# Patient Record
Sex: Male | Born: 1960 | Race: Black or African American | Hispanic: No | Marital: Married | Smoking: Current every day smoker
Health system: Southern US, Community
[De-identification: ages and names within clinical notes are randomized; demographics above are authoritative.]

## PROBLEM LIST (undated history)

## (undated) DIAGNOSIS — E119 Type 2 diabetes mellitus without complications: Secondary | ICD-10-CM

## (undated) DIAGNOSIS — N183 Chronic kidney disease, stage 3 unspecified: Secondary | ICD-10-CM

## (undated) DIAGNOSIS — I480 Paroxysmal atrial fibrillation: Secondary | ICD-10-CM

## (undated) DIAGNOSIS — E7800 Pure hypercholesterolemia, unspecified: Secondary | ICD-10-CM

## (undated) DIAGNOSIS — C61 Malignant neoplasm of prostate: Secondary | ICD-10-CM

## (undated) DIAGNOSIS — I10 Essential (primary) hypertension: Secondary | ICD-10-CM

## (undated) DIAGNOSIS — Z8679 Personal history of other diseases of the circulatory system: Secondary | ICD-10-CM

## (undated) DIAGNOSIS — Z872 Personal history of diseases of the skin and subcutaneous tissue: Secondary | ICD-10-CM

## (undated) DIAGNOSIS — R42 Dizziness and giddiness: Secondary | ICD-10-CM

## (undated) HISTORY — PX: LUNG LOBECTOMY: SHX167

---

## 2004-07-29 DIAGNOSIS — H8110 Benign paroxysmal vertigo, unspecified ear: Secondary | ICD-10-CM | POA: Insufficient documentation

## 2011-02-07 ENCOUNTER — Ambulatory Visit: Payer: Self-pay | Admitting: Internal Medicine

## 2011-02-17 ENCOUNTER — Ambulatory Visit: Payer: Self-pay | Admitting: Internal Medicine

## 2011-04-08 HISTORY — PX: HIP ARTHROPLASTY: SHX981

## 2011-04-28 ENCOUNTER — Ambulatory Visit: Payer: Self-pay | Admitting: Unknown Physician Specialty

## 2011-04-28 LAB — BASIC METABOLIC PANEL
BUN: 27 mg/dL — ABNORMAL HIGH (ref 7–18)
Chloride: 101 mmol/L (ref 98–107)
Creatinine: 1.61 mg/dL — ABNORMAL HIGH (ref 0.60–1.30)
EGFR (African American): 59 — ABNORMAL LOW
Glucose: 114 mg/dL — ABNORMAL HIGH (ref 65–99)
Potassium: 4.3 mmol/L (ref 3.5–5.1)
Sodium: 137 mmol/L (ref 136–145)

## 2011-04-28 LAB — URINALYSIS, COMPLETE
Bilirubin,UR: NEGATIVE
Glucose,UR: NEGATIVE mg/dL (ref 0–75)
Hyaline Cast: 14
Ketone: NEGATIVE
Ph: 5 (ref 4.5–8.0)
Protein: 30
Squamous Epithelial: 2
WBC UR: 8 /HPF (ref 0–5)

## 2011-04-28 LAB — CBC
HCT: 47.1 % (ref 40.0–52.0)
MCH: 26.4 pg (ref 26.0–34.0)
MCHC: 33.1 g/dL (ref 32.0–36.0)
MCV: 80 fL (ref 80–100)
Platelet: 314 10*3/uL (ref 150–440)
RDW: 15 % — ABNORMAL HIGH (ref 11.5–14.5)
WBC: 12.2 10*3/uL — ABNORMAL HIGH (ref 3.8–10.6)

## 2011-04-28 LAB — PROTIME-INR
INR: 0.9
Prothrombin Time: 13 secs (ref 11.5–14.7)

## 2011-05-19 ENCOUNTER — Inpatient Hospital Stay: Payer: Self-pay | Admitting: Unknown Physician Specialty

## 2011-05-19 LAB — ELECTROLYTE PANEL
Chloride: 101 mmol/L (ref 98–107)
Co2: 28 mmol/L (ref 21–32)
Potassium: 4 mmol/L (ref 3.5–5.1)
Sodium: 138 mmol/L (ref 136–145)

## 2011-05-20 LAB — BASIC METABOLIC PANEL
Anion Gap: 9 (ref 7–16)
BUN: 21 mg/dL — ABNORMAL HIGH (ref 7–18)
Calcium, Total: 7.5 mg/dL — ABNORMAL LOW (ref 8.5–10.1)
Co2: 24 mmol/L (ref 21–32)
EGFR (Non-African Amer.): 35 — ABNORMAL LOW
Glucose: 132 mg/dL — ABNORMAL HIGH (ref 65–99)
Osmolality: 267 (ref 275–301)
Potassium: 4.6 mmol/L (ref 3.5–5.1)
Sodium: 131 mmol/L — ABNORMAL LOW (ref 136–145)

## 2011-05-21 LAB — BASIC METABOLIC PANEL
Calcium, Total: 7.9 mg/dL — ABNORMAL LOW (ref 8.5–10.1)
Co2: 23 mmol/L (ref 21–32)
EGFR (African American): 36 — ABNORMAL LOW
EGFR (Non-African Amer.): 30 — ABNORMAL LOW
Glucose: 101 mg/dL — ABNORMAL HIGH (ref 65–99)
Potassium: 4.6 mmol/L (ref 3.5–5.1)
Sodium: 130 mmol/L — ABNORMAL LOW (ref 136–145)

## 2011-05-21 LAB — OSMOLALITY, URINE: Osmolality: 452 mOsm/kg

## 2011-05-21 LAB — TSH: Thyroid Stimulating Horm: 2.17 u[IU]/mL

## 2011-05-21 LAB — HEMOGLOBIN: HGB: 8.4 g/dL — ABNORMAL LOW (ref 13.0–18.0)

## 2011-05-22 LAB — BASIC METABOLIC PANEL
Anion Gap: 8 (ref 7–16)
BUN: 17 mg/dL (ref 7–18)
Calcium, Total: 8.2 mg/dL — ABNORMAL LOW (ref 8.5–10.1)
Chloride: 99 mmol/L (ref 98–107)
Co2: 24 mmol/L (ref 21–32)
Creatinine: 1.35 mg/dL — ABNORMAL HIGH (ref 0.60–1.30)
EGFR (African American): >60
EGFR (Non-African Amer.): 59 — ABNORMAL LOW
Glucose: 116 mg/dL — ABNORMAL HIGH (ref 65–99)
Osmolality: 265 (ref 275–301)
Potassium: 4.5 mmol/L (ref 3.5–5.1)
Sodium: 131 mmol/L — ABNORMAL LOW (ref 136–145)

## 2011-05-22 LAB — CBC WITH DIFFERENTIAL/PLATELET
Basophil #: 0 10*3/uL (ref 0.0–0.1)
Basophil %: 0 %
Eosinophil #: 0 10*3/uL (ref 0.0–0.7)
HCT: 22.6 % — ABNORMAL LOW (ref 40.0–52.0)
HGB: 7.6 g/dL — ABNORMAL LOW (ref 13.0–18.0)
Lymphocyte #: 1.7 10*3/uL (ref 1.0–3.6)
Lymphocyte %: 10.5 %
MCHC: 33.5 g/dL (ref 32.0–36.0)
Monocyte %: 10.9 %
Neutrophil #: 12.8 10*3/uL — ABNORMAL HIGH (ref 1.4–6.5)
Platelet: 171 10*3/uL (ref 150–440)
RDW: 15.2 % — ABNORMAL HIGH (ref 11.5–14.5)
WBC: 16.4 10*3/uL — ABNORMAL HIGH (ref 3.8–10.6)

## 2011-05-23 LAB — BASIC METABOLIC PANEL
Anion Gap: 10 (ref 7–16)
BUN: 14 mg/dL (ref 7–18)
Calcium, Total: 8.2 mg/dL — ABNORMAL LOW (ref 8.5–10.1)
Chloride: 101 mmol/L (ref 98–107)
Creatinine: 1.22 mg/dL (ref 0.60–1.30)
Osmolality: 273 (ref 275–301)

## 2011-05-24 LAB — UR PROT ELECTROPHORESIS, URINE RANDOM

## 2011-06-09 ENCOUNTER — Ambulatory Visit: Payer: Self-pay | Admitting: Internal Medicine

## 2011-06-09 ENCOUNTER — Ambulatory Visit: Payer: Self-pay | Admitting: Oncology

## 2011-06-11 LAB — CBC CANCER CENTER
Eosinophil: 3 %
Lymphocytes: 22 %
Monocytes: 8 %
Platelet: 442 x10 3/mm — ABNORMAL HIGH (ref 150–440)
RBC: 3.86 10*6/uL — ABNORMAL LOW (ref 4.40–5.90)
RDW: 15.5 % — ABNORMAL HIGH (ref 11.5–14.5)
Segmented Neutrophils: 66 %
WBC: 11.7 x10 3/mm — ABNORMAL HIGH (ref 3.8–10.6)

## 2011-06-11 LAB — IRON AND TIBC
Iron Bind.Cap.(Total): 362 ug/dL (ref 250–450)
Iron Saturation: 18 %
Iron: 64 ug/dL — ABNORMAL LOW (ref 65–175)

## 2011-06-11 LAB — LACTATE DEHYDROGENASE: LDH: 144 U/L (ref 87–241)

## 2011-06-11 LAB — FOLATE: Folic Acid: 15.9 ng/mL (ref 3.1–100.0)

## 2011-06-11 LAB — RETICULOCYTES
Absolute Retic Count: 0.16 10*6/uL — ABNORMAL HIGH (ref 0.024–0.084)
Reticulocyte: 4.3 % — ABNORMAL HIGH (ref 0.5–1.5)

## 2011-06-25 LAB — CBC CANCER CENTER
Basophil #: 0.3 x10 3/mm — ABNORMAL HIGH (ref 0.0–0.1)
Eosinophil #: 0.5 x10 3/mm (ref 0.0–0.7)
HCT: 37.7 % — ABNORMAL LOW (ref 40.0–52.0)
HGB: 12.2 g/dL — ABNORMAL LOW (ref 13.0–18.0)
Lymphocyte %: 23.8 %
MCHC: 32.3 g/dL (ref 32.0–36.0)
Monocyte %: 8.3 %
Neutrophil %: 61 %
Platelet: 310 x10 3/mm (ref 150–440)
RBC: 4.49 10*6/uL (ref 4.40–5.90)
RDW: 16.9 % — ABNORMAL HIGH (ref 11.5–14.5)
WBC: 12.5 x10 3/mm — ABNORMAL HIGH (ref 3.8–10.6)

## 2011-07-07 ENCOUNTER — Ambulatory Visit: Payer: Self-pay | Admitting: Oncology

## 2011-07-07 ENCOUNTER — Emergency Department: Payer: Self-pay | Admitting: Emergency Medicine

## 2011-07-07 ENCOUNTER — Ambulatory Visit: Payer: Self-pay | Admitting: Internal Medicine

## 2011-07-07 LAB — BASIC METABOLIC PANEL
BUN: 17 mg/dL (ref 7–18)
Calcium, Total: 9 mg/dL (ref 8.5–10.1)
Co2: 24 mmol/L (ref 21–32)
Creatinine: 1.21 mg/dL (ref 0.60–1.30)
EGFR (African American): >60
EGFR (Non-African Amer.): >60
Osmolality: 273 (ref 275–301)
Sodium: 135 mmol/L — ABNORMAL LOW (ref 136–145)

## 2011-07-07 LAB — TROPONIN I: Troponin-I: <0.02 ng/mL

## 2011-07-07 LAB — CBC
HGB: 12.3 g/dL — ABNORMAL LOW (ref 13.0–18.0)
MCH: 27 pg (ref 26.0–34.0)
MCHC: 32.4 g/dL (ref 32.0–36.0)
MCV: 83 fL (ref 80–100)
RBC: 4.54 10*6/uL (ref 4.40–5.90)
RDW: 17.7 % — ABNORMAL HIGH (ref 11.5–14.5)
WBC: 11.3 10*3/uL — ABNORMAL HIGH (ref 3.8–10.6)

## 2012-02-18 IMAGING — US US RENAL KIDNEY
1 series · 17 of 25 positions shown · non-contrast
Comparison: none

REASON FOR EXAM: ckd
COMMENTS:

[Series 1: us renal kidney · 17 of 35 slices shown]
[im 1/35]
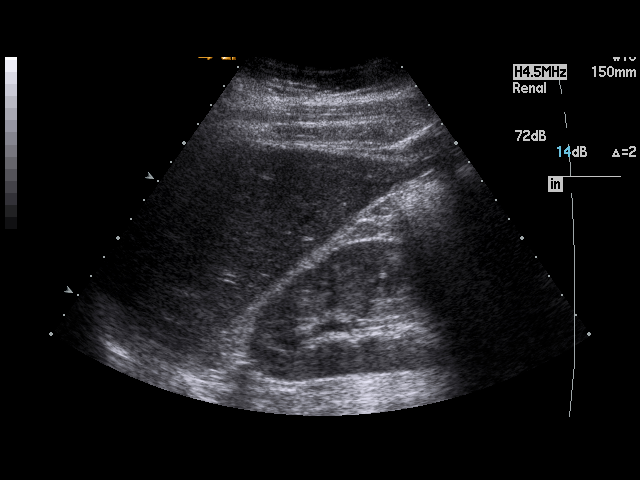
[im 3/35]
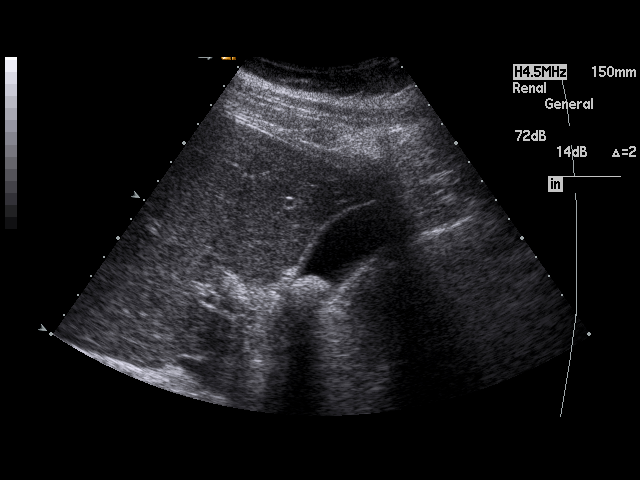
[im 5/35]
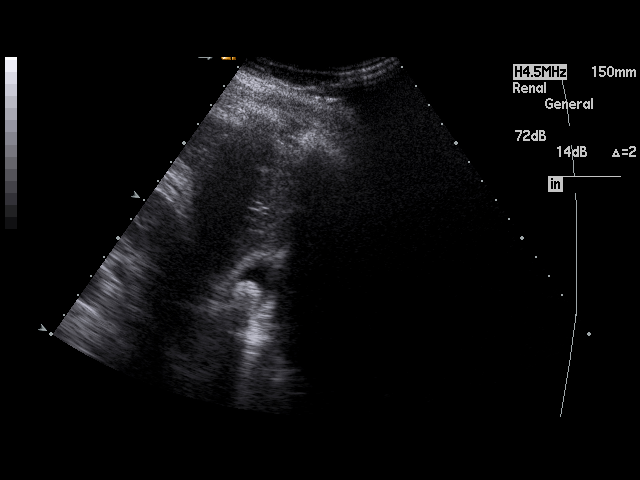
[im 8/35]
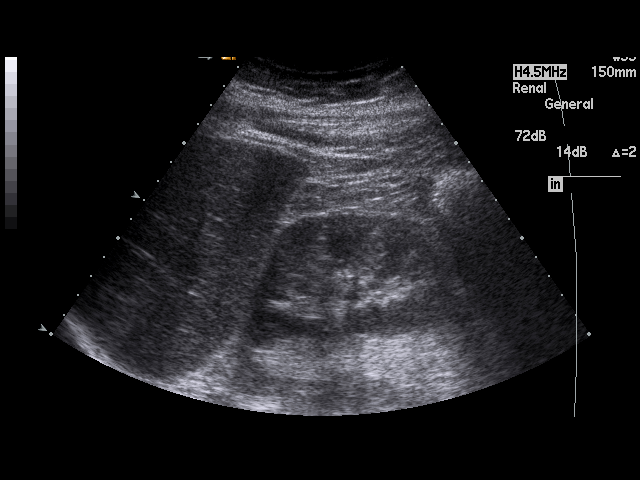
[im 9/35]
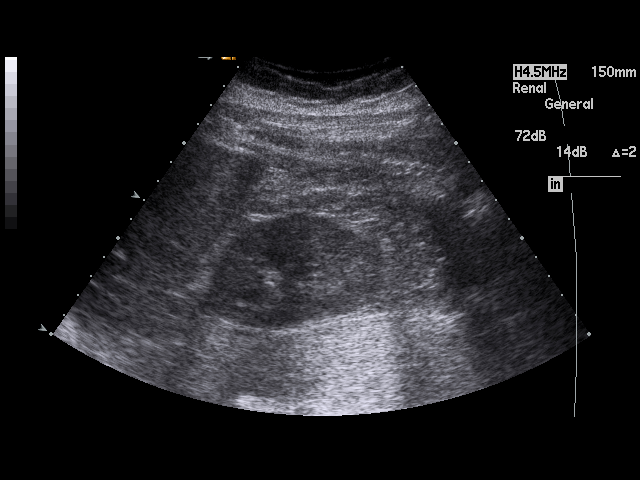
[im 12/35]
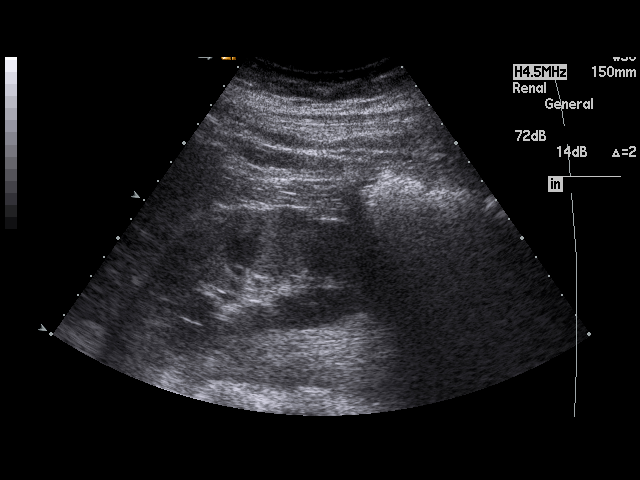
[im 13/35]
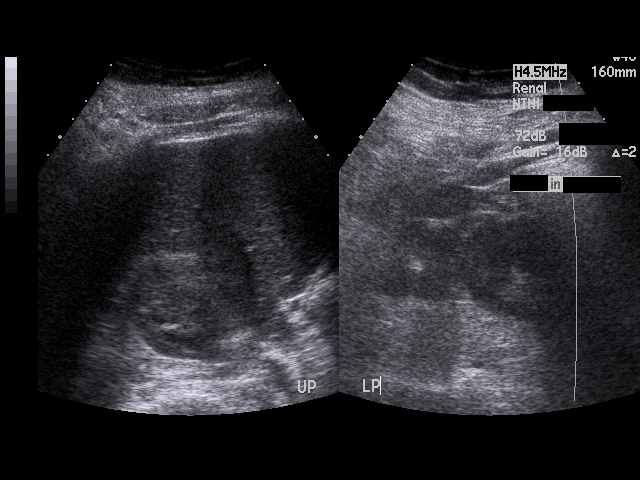
[im 16/35]
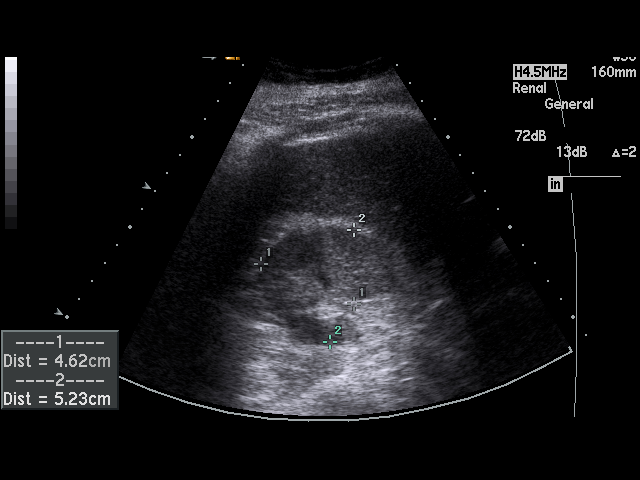
[im 18/35]
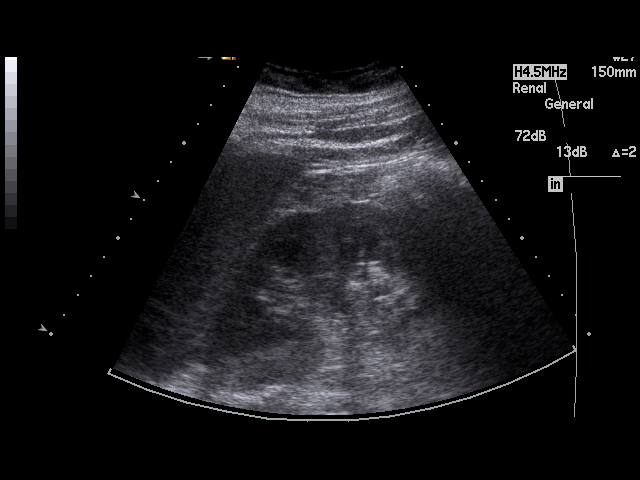
[im 19/35]
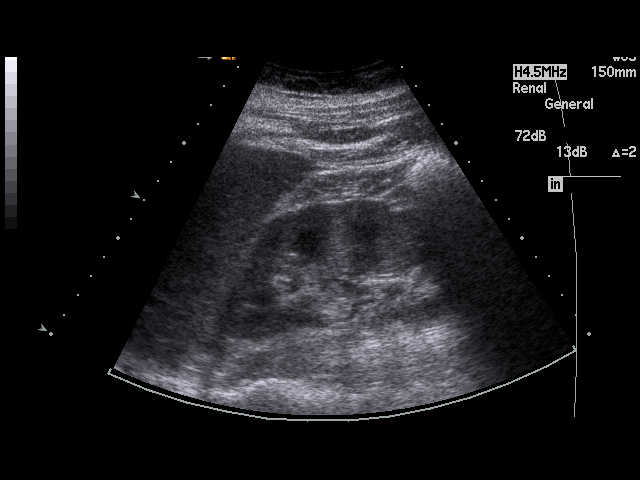
[im 22/35]
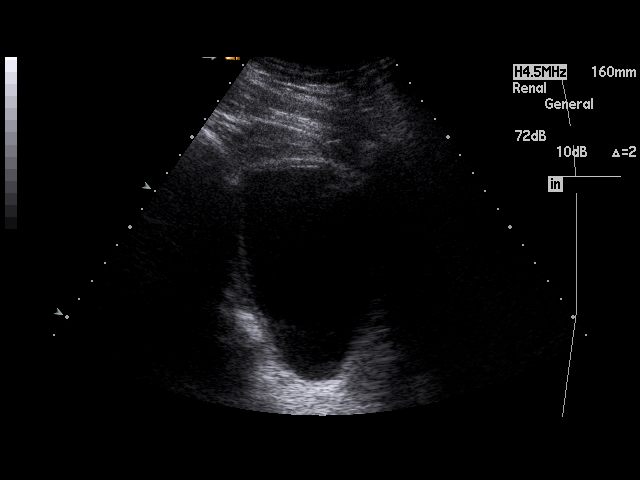
[im 23/35]
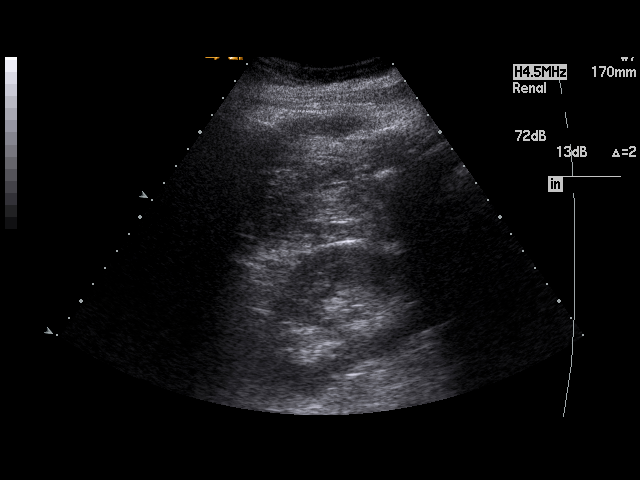
[im 26/35]
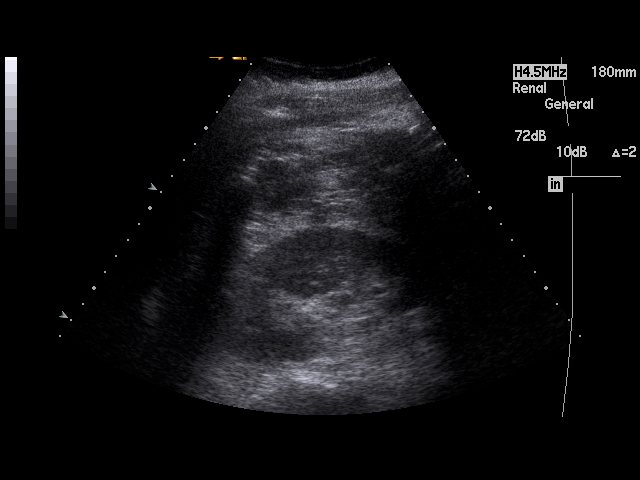
[im 27/35]
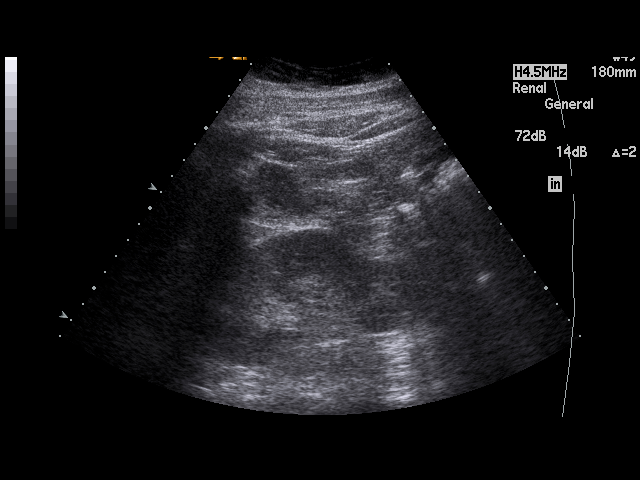
[im 30/35]
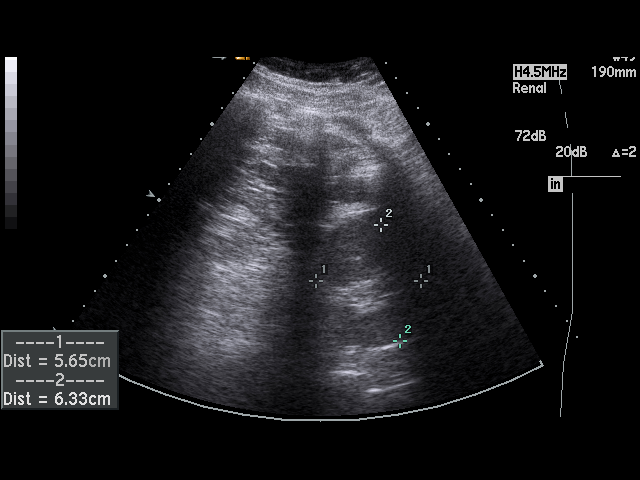
[im 32/35]
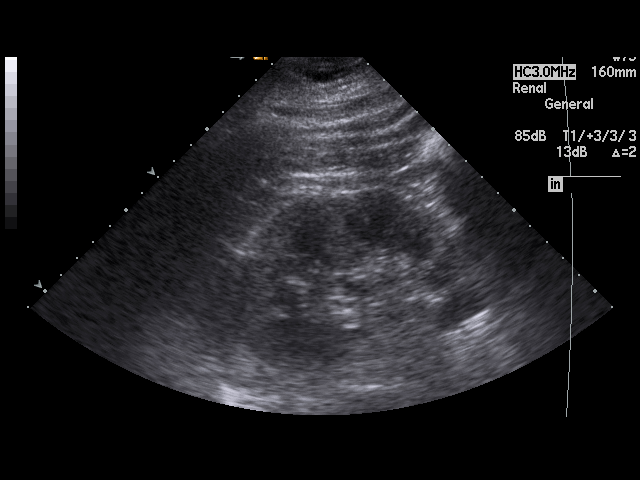
[im 35/35]
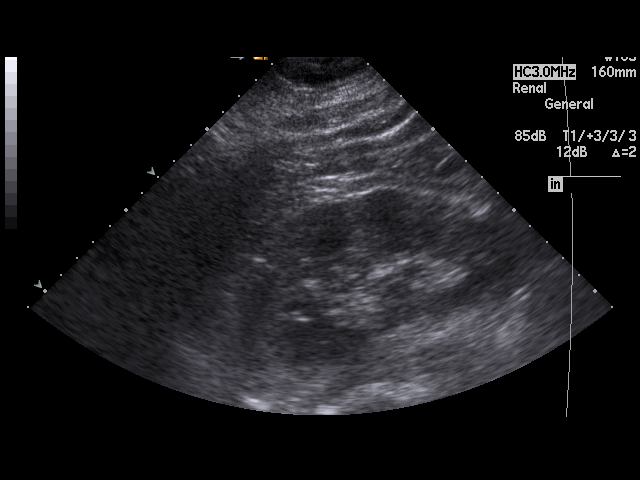

[17 of 25 positions shown; findings below may reference images not displayed]

PROCEDURE:     US  - US KIDNEY  - May 21, 2011  [DATE]

RESULT:     The right kidney measures 9.6 by 4.6 x 5.2 cm. The left kidney
measures 11.9 by 5.7 x 6.3 cm. There is no evidence of hydronephrosis. The
echotexture of the renal cortex on the right is only slightly lower than
that of the adjacent liver. The partially distended urinary bladder is
normal in appearance.

Incidental note is made of at least one gallstone measuring nearly 2 cm in
diameter.
IMPRESSION: 1. I do not see acute abnormality of either kidney. I cannot exclude an
element of medical renal disease given the very mild increase in the
echotexture of the renal cortex on the right as compared to the liver.
2. There is a gallstone present.

## 2012-05-09 ENCOUNTER — Inpatient Hospital Stay: Payer: Self-pay | Admitting: Surgery

## 2012-05-09 LAB — COMPREHENSIVE METABOLIC PANEL
Alkaline Phosphatase: 168 U/L — ABNORMAL HIGH (ref 50–136)
Anion Gap: 8 (ref 7–16)
BUN: 15 mg/dL (ref 7–18)
Bilirubin,Total: 0.6 mg/dL (ref 0.2–1.0)
Calcium, Total: 9.4 mg/dL (ref 8.5–10.1)
Co2: 24 mmol/L (ref 21–32)
Creatinine: 1.5 mg/dL — ABNORMAL HIGH (ref 0.60–1.30)
EGFR (Non-African Amer.): 53 — ABNORMAL LOW
Glucose: 110 mg/dL — ABNORMAL HIGH (ref 65–99)
Osmolality: 272 (ref 275–301)
Potassium: 4.1 mmol/L (ref 3.5–5.1)
SGOT(AST): 21 U/L (ref 15–37)
Sodium: 135 mmol/L — ABNORMAL LOW (ref 136–145)
Total Protein: 9.7 g/dL — ABNORMAL HIGH (ref 6.4–8.2)

## 2012-05-09 LAB — CBC
HCT: 43.7 % (ref 40.0–52.0)
HGB: 14 g/dL (ref 13.0–18.0)
MCH: 25 pg — ABNORMAL LOW (ref 26.0–34.0)
MCV: 78 fL — ABNORMAL LOW (ref 80–100)
Platelet: 430 10*3/uL (ref 150–440)
RDW: 15.4 % — ABNORMAL HIGH (ref 11.5–14.5)

## 2012-05-14 LAB — WOUND CULTURE

## 2012-05-15 LAB — CULTURE, BLOOD (SINGLE)

## 2012-09-06 DIAGNOSIS — L732 Hidradenitis suppurativa: Secondary | ICD-10-CM | POA: Insufficient documentation

## 2012-11-10 LAB — LIPID PANEL
Cholesterol: 207 mg/dL — AB (ref 0–200)
HDL: 34 mg/dL — AB (ref 35–70)
LDL Cholesterol: 133 mg/dL
Triglycerides: 201 mg/dL — AB (ref 40–160)

## 2012-11-10 LAB — PSA: PSA: 0.7

## 2012-11-16 ENCOUNTER — Ambulatory Visit: Payer: Self-pay | Admitting: Internal Medicine

## 2012-12-06 ENCOUNTER — Ambulatory Visit: Payer: Self-pay | Admitting: Internal Medicine

## 2013-04-07 HISTORY — PX: LEG SKIN LESION  BIOPSY / EXCISION: SUR473

## 2013-04-07 HISTORY — PX: VARICOSE VEIN SURGERY: SHX832

## 2014-06-16 LAB — BASIC METABOLIC PANEL
BUN: 23 mg/dL — AB (ref 4–21)
Creatinine: 1.6 mg/dL — AB (ref <=1.3)

## 2014-06-16 LAB — HEMOGLOBIN A1C: Hgb A1c MFr Bld: 6.3 % — AB (ref 4.0–6.0)

## 2014-07-28 NOTE — Op Note (Signed)
PATIENT NAME:  Jon Mendez, Jon Mendez MR#:  [id number] DATE OF BIRTH:  03/23/1960  DATE OF PROCEDURE:  05/10/2012  PREOPERATIVE DIAGNOSIS: Left-sided perianal abscess.   POSTOPERATIVE DIAGNOSIS: Left-sided perianal abscess secondary to hidradenitis suppurativa.   PROCEDURE PERFORMED: Examination under anesthesia, incision and drainage of perianal abscess on the left side and placement of Penrose drain.   SURGEON: Sherri Rad, M.D. FACS   ANESTHESIA: General with LMA.   DESCRIPTION OF PROCEDURE: With the patient in dorsal lithotomy in general anesthesia induced, perineum was sterilely prepped and draped with Betadine solution. Timeout was observed.   A digital rectal examination demonstrated no obvious connection of the mass from left lateral perianal skin to the rectum. There was evidence of previous scarification from hidradenitis. An incision was fashioned over the most fluctuant area of the mass in the left side with scalpel and a large amount of seropurulent fluid was extruded and an aliquot was sent for microbacteriologic  analysis. The cavity appeared to track superiorly towards the perineum. A counter incision was fashioned. A Penrose drain was placed and the wound was packed with an entire 2 inches Kling Kerlix gauze. Hemostasis being ensured on the operative field, operative dressing was placed consisting of ABDs and mesh surgical underwear and the patient was then returned supine, extubated and taken to the recovery room in stable and satisfactory condition by anesthesia services.  ____________________________ Jeannette How Marina Gravel, MD mab:aw D: 05/11/2012 10:20:32 ET T: 05/11/2012 12:47:36 ET JOB#: [id number]  cc: Elta Guadeloupe A. Marina Gravel, MD, <Dictator> Halina Maidens, MD [place] MD ELECTRONICALLY SIGNED 05/17/2012 9:08

## 2014-07-28 NOTE — Discharge Summary (Signed)
PATIENT NAME:  Jon Mendez, Jon Mendez MR#:  [id number] DATE OF BIRTH:  03/23/1960  DATE OF ADMISSION:  05/09/2012 DATE OF DISCHARGE:  05/11/2012  FINAL DIAGNOSES: Chronic hidradenitis, perineal area, left-sided perianal abscess secondary to hidradenitis.   PROCEDURE PERFORMED: Incision and drainage of left-sided perianal abscess. Examination under anesthesia on 02/03.   HOSPITAL COURSE SUMMARY: The patient was admitted on 02/02. He was noted to have a large left sided perianal abscess secondary to chronic hidradenitis. He had had multiple procedures in the past for this by plastic surgery at [facility]. Postoperatively, he had an uneventful course. He had adequate pain control. Packing was removed on postoperative day #1. The patient was able to be discharged ambulatory, voiding and with good pain control.   DISCHARGE MEDICATIONS: Keflex 500 mg by mouth, acetaminophen/hydrocodone 1 tab every 4 hours as needed for pain, multivitamins 1 tab by mouth every day, pravastatin 20 mg by mouth once the morning, hydrochlorothiazide/losartan 25/100 mg tablet 1 tab by mouth in the morning. He will follow-up with me in 7 to 10 days in the office.    ____________________________ Jeannette How. Marina Gravel, MD mab:aw D: 05/25/2012 12:58:02 ET T: 05/25/2012 13:17:29 ET JOB#: [id number]  cc: Elta Guadeloupe A. Marina Gravel, MD, <Dictator> Hortencia Conradi MD ELECTRONICALLY SIGNED 05/25/2012 15:35

## 2014-07-28 NOTE — H&P (Signed)
    Subjective/Chief Complaint Right perianal buttocks pain, drainage x 1 week    History of Present Illness Jon Mendez is a pleasant 54 yo M with a PMH of perirectal abscesses and hidradenitis s/p drainage who presents with 1 week of worsening right perianal pain and drainage.  He says that he first noticed the pain 1 week ago.  He says that it has became increasingly tender and firm.  Today it began draining bloody fluid.  Pain has improved.  + 1 day where he had subjective fever.  No chills, night sweats, shortness of breath, cough, chest pain, nausea/vomiting, diarrhea/constipation, dysuria/hematuria.    Past History H/o ARF H/o perianal abscesses H/o left hip surgery 2013 HTN H/o excision of abdomen, groin for hidradenitis    Past Medical Health Hypertension, Smoking   Past Med/Surgical Hx:  Acute Renal Failure:   Hyperlipidemia:   Painful Left Hip:   Hypertension:   Hip Surgery - Left, arthroplasty:   Excision Abdominal Skin Cyst At [facility]:   ALLERGIES:  Eggs: Swelling  Fish: Swelling  NKDA: None  Family and Social History:   Family History Hypertension  Diabetes Mellitus  Cancer  Renal failure    Social History positive  tobacco, negative ETOH, negative Illicit drugs    + Tobacco Current (within 1 year)  1/2 ppd    Place of [place], here with wife   Review of Systems:   Subjective/Chief Complaint Right buttock pain, drainage    Fever/Chills Yes    Cough No    Sputum No    Abdominal Pain No    Diarrhea No    Constipation No    Nausea/Vomiting No    SOB/DOE No    Chest Pain No    Dysuria No    Tolerating Diet Yes   Physical Exam:   GEN well developed, well nourished, no acute distress    HEENT pink conjunctivae, PERRL    RESP normal resp effort  clear BS  no use of accessory muscles    CARD regular rate  no murmur    ABD denies tenderness  no hernia  soft  normal BS  no Adominal Mass  +abdominal scarring from prior  hidradenitis surgery, + large fluctuant perianal fluid collection with bloody drainage    EXTR negative edema    SKIN normal to palpation, positive rashes, No ulcers    NEURO cranial nerves intact, negative rigidity, negative tremor, follows commands, motor/sensory function intact    PSYCH alert, A+O to time, place, person, good insight     Assessment/Admission Diagnosis Jon Mendez is a pleasant 54 yo M with perianal abscess, leukocytosis.    Plan To OR for I and D of perianal abscess.   Electronic Signatures: Floyde Parkins (MD)  (Signed 02-Feb-14 17:44)  Authored: CHIEF COMPLAINT and HISTORY, PAST MEDICAL/SURGIAL HISTORY, ALLERGIES, FAMILY AND SOCIAL HISTORY, REVIEW OF SYSTEMS, PHYSICAL EXAM, ASSESSMENT AND PLAN   Last Updated: 02-Feb-14 17:44 by Floyde Parkins (MD)

## 2014-07-30 NOTE — Consult Note (Signed)
PATIENT NAME:  Jon Mendez, Jon Mendez MR#:  [id number] DATE OF BIRTH:  03/23/1960  DATE OF CONSULTATION:  05/21/2011  REFERRING PHYSICIAN:  Leanor Kail, Brooke Bonito., MD  CONSULTING PHYSICIAN:  Judeth Horn. Royden Purl, MD  PRIMARY CARE PHYSICIAN: Dr. Halina Maidens   REASON FOR CONSULTATION: Hyponatremia, hypertension, acute renal failure, azotemia.   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 Since the operation, he has had no chest pain, nausea, vomiting, diarrhea, fevers, or chills. He's had lightheadedness and mild shortness of breath, said he's very tired. Apparently he has been tachycardic and hypotensive. This morning his blood pressure is 116/64, heart rate 112; yesterday blood pressure was as low as 86/56. He said he has 7 out of 10 pain with movement of his left hip. He said he does not have any kidney problems. Currently his creatinine is 2.44, BUN 27, sodium 130.  At this institution in January when he was here his BUN was 27, creatinine 1.61, sodium 137. We are asked to see the patient for hyponatremia, hypotension, and acute renal failure.   PAST MEDICAL HISTORY:  1. Hypertension.  2. Hypercholesteremia.   MEDICATIONS AT HOME:  1. Aleve 1 to 2 tablets once a day. 2. Amlodipine 5 mg daily.  3. Hydrochlorothiazide/losartan 25 mg/100 one tablet in the morning. 4. Naproxen 500 mg 2 times a day.  5. Pravastatin 20 mg daily.  6. Tramadol 50 mg 1 to 2 tablets p.o. q.6 to 8 hours.   PAST SURGICAL HISTORY: He said he had some kind of skin procedure.  ALLERGIES: Eggs and fish.   FAMILY HISTORY: Both his parents died of cancer, his mother in her 66's; father in his 49's, unsure what type of cancers.   SOCIAL HISTORY: He is married with four healthy children. He smokes about half a pack a day, has been doing this for many years. He works as a  Therapist, occupational. Drinks occasionally. No illicit drugs.   REVIEW OF SYSTEMS: CONSTITUTIONAL: No fever or fatigue. He does have some weakness. EYES: No blurred vision, double vision, pain, redness, inflammation, glaucoma. ENT: No tinnitus, ear pain, hearing loss, seasonal allergies, epistaxis, discharge. RESPIRATORY: No cough, wheezing, hemoptysis, dyspnea, asthma, painful respirations. CARDIOVASCULAR: No chest pain, orthopnea, edema, arrhythmia, dyspnea on exertion, palpitations. GI: No nausea, vomiting, diarrhea, abdominal pain, hematemesis, melena, gastroesophageal reflux disease. GU: No dysuria, hematuria, renal calculi, frequency, incontinence. GU: Male. No sores, discharge, prostatitis. ENDOCRINE: No polyuria, nocturia, thyroid problems, increased sweating, heat or cold intolerance. HEME/LYMPH: No anemia, easy bruising, swollen glands. INTEGUMENTARY: No acne, rash, change in mole, hair or skin. MUSCULOSKELETAL: He has pain in his left hip. No pain in neck shoulder, knee, arthritis. NEUROLOGIC: No numbness. He does have weakness and lightheadedness when he stands. No dysarthria, epilepsy, tremor, vertigo, ataxia. PSYCH: No anxiety, insomnia, ADD, bipolar, depression.   PHYSICAL EXAMINATION:   VITAL SIGNS: Currently heart rate 115, respiratory rate 18, blood pressure 133/55, sating 93% on 2 liters.   GENERAL: The patient is well developed, well nourished, high BMI.   HEENT: Pupils are equal, reactive to light and accommodation. Extraocular movements intact. Anicteric sclerae. No difficulty hearing.   NECK: No lymphadenopathy. No carotid bruits.   LUNGS: Clear to auscultation. No adventitious breath sounds. Resonant to percussion. No use of accessory muscles or increased work of breathing.   CARDIOVASCULAR: Tachycardic. No murmurs, gallops, or rubs appreciated. PMI  not lateralized.  EXTREMITIES: No lower extremity edema. 2+ dorsalis pedis pulses.   BREASTS: No obvious masses.    ABDOMEN: Soft, nontender, nondistended. Positive bowel sounds.   GU: Deferred.   MUSCULOSKELETAL: Strength 5/5. No clubbing, cyanosis, or degenerative joint disease.   SKIN: No rashes, lesions, erythema, nodules. Warm to touch. He has a bandage on his left hip which is clean, dry, and intact.    LYMPH: No lymphadenopathy in the cervical or supraclavicular area.    NEUROLOGIC: Cranial nerves II through XII are intact. Follows commands. No focal deficit. No dysarthria, aphasia, dysphagia, or contractures.   PSYCH: Alert and oriented x3 with good judgment.   LABORATORY DATA: Glucose 101, BUN 27, creatinine 2.44, sodium 138, potassium 4.6, chloride 97, bicarb 23, hemoglobin 8.4 on admission, in January was 15.6.   ASSESSMENT AND RECOMMENDATIONS: This is a pleasant 54 year old African American male with history of hypertension and hypercholesteremia postoperative day #2 for left total hip arthroplasty consulted for hypertension, acute renal failure, and hyponatremia.  1. Acute renal failure. The patient has baseline CKD. He has never seen a nephrologist. Will get urine lytes and urine uric acid to calculate FENA and get renal ultrasound. I have spoken to Dr. Holley Raring who will see the patient. The patient at home was on ARB, hydrochlorothiazide, naproxen, and Aleve. NSAIDs can be concerning with CKD. 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 Would avoid nephrotoxins. Hold hydrochlorothiazide. Hold ARB. Would give IV fluids. Would probably also give 1 unit of PRBC transfusion. 2. Postop anemia. The patient is symptomatic with tachycardia, shortness of breath, lightheaded. I explained the risks and benefits of transfusion. He and his wife were agreeable. Would transfuse 1 unit PRBCs.  3. Hyponatremia. Will check urine electrolytes. Most likely this is just dehydration. Will check TSH also and give IV fluids.  4. Hypotension. Will  hold hydrochlorothiazide and ARB for now. He is actually now mildly hypertensive. Will reassess and consider restarting antihypertensive agents as needed.  5. Smoking. Counseled about cessation for three minutes.  6. Postoperative day two for left total hip arthroplasty. Would continue PT and analgesia as per Orthopedics.  7. Hyperglycemia. Will check A1c as his kidney disease could be from diabetes.  8. Hyperlipidemia. Would continue statin.  9. DVT prophylaxis. Maintain with Lovenox.   CODE STATUS: FULL CODE.   TOTAL TIME SPENT ON CONSULTATION: 50 minutes.   Thank you for allowing me to see this patient in consultation.  ____________________________ Judeth Horn Royden Purl, MD aaf:drc D: 05/21/2011 09:14:54 ET T: 05/21/2011 10:14:53 ET JOB#: [id number]  cc: Mike Craze A. Royden Purl, MD, <Dictator> Halina Maidens, MD Kathrene Alu., MD [place] MD ELECTRONICALLY SIGNED 05/21/2011 16:11

## 2014-07-30 NOTE — Op Note (Signed)
PATIENT NAME:  Jon Mendez, Jon Mendez MR#:  [id number] DATE OF BIRTH:  03/23/1960  DATE OF PROCEDURE:  05/19/2011  PREOPERATIVE DIAGNOSIS: Avascular necrosis, left hip.   POSTOPERATIVE DIAGNOSIS: Avascular necrosis, left hip.   PROCEDURE: Stryker total hip replacement, on the left.   SURGEON: Kathrene Alu., M.D.   FIRST ASSISTANT: Dorthula Matas, PA-C  ANESTHESIA: General.   HISTORY: The patient had a fairly long history of left hip pain. X-rays were consistent with avascular necrosis. The patient was ultimately brought in for total hip replacement because of his progressive discomfort.   DESCRIPTION OF PROCEDURE: The patient was taken to the Operating Room where satisfactory general anesthesia was achieved. The patient was turned to the lateral decubitus position with the left hip up. The left hip was then prepped and draped in the usual fashion for a total hip procedure. The patient incidentally was given 2 grams Kefzol IV prior to the start of the procedure.   Next, a slightly curved posterolateral incision was made. Dissection was carried down through the subcutaneous tissue onto the gluteus maximus fascia and fascia lata. They were divided in line with the incision. I then inserted a Charnley retractor into the depths of the wound. Care was taken to protect the sciatic nerve.   The piriformis was then divided at its attachment to the greater trochanter. The remaining external rotators were then divided at their attachment to the greater trochanter and reflected over the sciatic nerve. The capsule was divided in a T-shaped fashion. A posterior, superior capsulectomy was performed. Next, a smooth pin was inserted into the ilium several fingerbreadths superior to the lateral edge of the acetabulum. The smooth pin was bent at right angles so it could be used as a leg length indicator.   Next, the hip was dislocated. The femoral head was osteotomized about a fingerbreadth above the  lesser trochanter. I used the Accolade cutting guide to facilitate getting the neck cut at the appropriate angle.   The femoral head was then removed. The osteonecrotic segment was readily identified.   I inserted the appropriate retractors to gain visualization of the acetabulum. I went ahead and reamed the acetabulum to accommodate a 56 mm acetabular shell.   The permanent 56 mm acetabular shell was impacted into the acetabulum at about 45 degrees of abduction and about 15 degrees of forward flexion. It seemed to be quite stable.  I did further secure the acetabular shell with a 6.5 mm cancellous bone screw that was 25 mm in length.   I then inserted the trial liner.   Next, I broached the proximal femur to accommodate a #5 Accolade hip stem. The hip stem had a neck angle of 132 degrees.   With the trial broach in place, reduction was performed. I used a 40 mm femoral head with a +0 offset. This seemed to be too tight so I changed it to a 40 mm head with a -4 mm offset. This seemed to reduced well and was quite stable.   I then removed the trials. The permanent #5 Accolade hip stem was impacted in the proximal femur. I then impacted the permanent 40 mm femoral head with a -4 mm offset onto the stem and reduced it in the acetabulum. Again, it moved well and was quite stable.   The wound was irrigated with GU irrigant. The external rotators were reattached to the greater trochanter through drill holes with #2 Orthocord sutures. I had previously released a portion of the  gluteus maximus attachment on the proximal femur. This was repaired with #1 Ethibond sutures. Two Hemovac tubes were inserted into the depths of the wound. The gluteus maximus fascia and fascia lata were then closed with #1 Ethibond and #1 Vicryl sutures. The subcutaneous was closed with zero and #2-0 Vicryl, and the skin with skin staples. I had previously removed the smooth Steinmann pin. This wound was closed with a single #3-0  nylon suture in vertical mattress fashion.   Betadine was applied to the wounds followed by a sterile dressing. Four TENS pads were applied about the hip wound. An abduction pillow was placed between the patient's legs and then he was turned supine and awakened. He was transferred to a stretcher bed. He was taken to the Recovery Room in satisfactory condition. Blood loss was about 600 mL.  SUMMARY OF IMPLANTS USED: 56 mm cluster acetabular shell, one 6.5 mm x 25 mm in length cancellous bone screw, and a 40 mm 0 degree <<MISSING TEXT>>.  ____________________________ Kathrene Alu., MD hbk:slb D: 05/19/2011 22:12:10 ET T: 05/20/2011 09:54:08 ET JOB#: [id number]  cc: Kathrene Alu., MD, <Dictator>

## 2014-07-30 NOTE — Consult Note (Signed)
PATIENT NAME:  Jon Mendez, Jon Mendez MR#:  [id number] DATE OF BIRTH:  03/23/1960  DATE OF CONSULTATION:  05/21/2011  REFERRING PHYSICIAN:  Deanne Coffer, MD  CONSULTING PHYSICIAN:  Shareka Casale Lilian Kapur, MD  REASON FOR CONSULTATION: Evaluation and management of acute renal failure and chronic kidney disease stage III.   HISTORY OF PRESENT ILLNESS: The patient is a pleasant 54 year old African American male with past medical history of hypertension, hypercholesterolemia, avascular necrosis of the left hip who presented to [facility] on 05/19/2011. The patient underwent left hip total arthroplasty on February 11th for avascular necrosis of the left hip. We are consulted now for evaluation and management of acute renal failure in the setting of known chronic kidney disease stage III. The patient reports to me that he was told by his primary care physician that his renal function was not normal several months ago. In January the patient's baseline creatinine was found to 1.6. The patient's creatinine has now risen to 2.4 postoperatively. The patient was taking NSAIDs at home including Aleve and Goody powders. When asked how long he was taking NSAIDs, he states that he was taking these for several months on a daily basis. He also has history of hypertension. Postoperatively there was some mild hypotension with systolic blood pressure as low as 86. He was also on losartan/hydrochlorothiazide, however, this has been held appropriately at this point in time. Medicine consultation was obtained through Dr. Royden Purl who subsequently consulted [place]. The patient also has evidence for volume depletion at this time as his serum sodium is down to 130. The patient's hemoglobin is also significantly down to 8.4. His hemoglobin on February 11th was 11. Therefore, it appears that there has been volume loss which could account for decreased circulating volume and subsequent acute renal failure.   PAST MEDICAL  HISTORY:  1. Hypertension.  2. Hyperlipidemia.  3. Avascular necrosis of the left hip, status post left hip total arthroplasty in February 2013.  4. Chronic kidney disease stage III with baseline creatinine 1.6.   PAST SURGICAL HISTORY: Left hip total arthroplasty performed on 05/19/2011.   ALLERGIES: No known drug allergies.   CURRENT INPATIENT MEDICATIONS:  1. Normal saline at 120 mL/h. 2. Docusate 240 mg p.o. daily.  3. Lovenox 30 mg subcutaneous q.12 hours. 4. Hydrochlorothiazide 25 mg p.o. daily, which is currently on hold. 5. Dilaudid 1 to 2 mg IV q.3 hours p.r.n. pain.  6. Losartan 100 mg p.o. daily, which is currently on hold.  7. Zofran 4 mg IV q.6 hours p.r.n.  8. Oxycodone 5 to 10 mg p.o. q.4 to 6 hours p.r.n.  9. Pravachol 20 mg p.o. daily.  10. Tylenol ES 1000 mg p.o. q.8 hours.  11. Tramadol 50 to 100 mg p.o. q.6 hours p.r.n. pain.   SOCIAL HISTORY: The patient is married and has four adult children. 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 He has active tobacco abuse but smokes less than a half a pack per day. Drinks alcohol occasionally, primarily on the weekends. No illicit drug use.   FAMILY HISTORY: The patient's parents both died of malignancies, however, the patient is unclear as to what type of malignancies they had.   REVIEW OF SYSTEMS: CONSTITUTIONAL: Denies fevers, chills, or weight loss. EYES: Denies diplopia, blurry vision, or loss of vision. HEENT: Denies headaches, hearing loss, tinnitus, epistaxis, sore throat. PULMONARY: Denies cough, shortness of breath, hemoptysis. CARDIOVASCULAR: Denies chest pain, palpitations, PND, orthopnea. GI: Denies nausea,  vomiting, diarrhea, or bloody stools. GU: Denies frequency, urgency, dysuria. NEUROLOGIC: Denies focal extremity numbness, weakness, or tingling. MUSCULOSKELETAL: Still has some left hip pain at present, otherwise, no other arthralgias. INTEGUMENTARY: Denies skin rashes or lesions.  ENDOCRINE: Denies polyuria, polydipsia, or polyphagia. HEMATOLOGIC/LYMPHATIC: Denies easy bruisability or bleeding. ALLERGY/IMMUNOLOGIC: Denies seasonal allergies or history of immunodeficiency. PSYCHIATRIC: Denies depression or bipolar disorder.   PHYSICAL EXAMINATION:   VITAL SIGNS: Temperature 98.9, pulse 115, respirations 18, blood pressure 163/84, pulse oximetry 95% on room air.   GENERAL: Well developed, well nourished Caucasian male who appears his stated age currently in no acute distress.   HEENT: Normocephalic, atraumatic. Extraocular movements are intact. Pupils equal, round, and reactive to light. No scleral icterus. Conjunctivae are pink. No epistaxis noted. Gross hearing intact. Oral mucosa moist.   NECK: Supple. No JVD or lymphadenopathy.   LUNGS: Clear to auscultation bilaterally with normal respiratory effort.   CARDIOVASCULAR: S1, S2. The patient was noted to be slightly tachycardic. No murmurs or rubs appreciated.   ABDOMEN: Obese, soft, nontender, nondistended. Bowel sounds positive. No rebound or guarding. No gross organomegaly appreciated.   EXTREMITIES: No clubbing or cyanosis noted. Trace bilateral lower extremity edema noted. There is a dressing noted over his left hip.   NEUROLOGIC: The patient is alert and oriented to time, person, and place. Strength is 5 out of 5 in both upper and lower extremities.   GU: No suprapubic tenderness is noted at this time.   MUSCULOSKELETAL: As above, there is a dressing overlying his left hip. This was not taken down.   SKIN: Warm and dry. No rashes noted.   PSYCHIATRIC: The patient has an appropriate affect and appears to have good insight into his current illness.   LABORATORY DATA: Sodium 130, potassium 4.6, chloride 97, CO2 23, BUN 27, creatinine 2.4, glucose 101, hemoglobin 8.4. Urinalysis shows urine protein 30 mg/dL, nitrites negative, 7 RBCs per high-power field, 8 WBCs per high-power field.   IMPRESSION: This is a  54 year old African American male with past medical history of hypertension, hyperlipidemia, chronic kidney disease stage III with baseline creatinine 1.6 who presented to [facility] for elective left total hip arthroplasty for avascular necrosis of the left hip.  1. Acute renal failure. I suspect this is multifactorial with contributions from medications including losartan/hydrochlorothiazide, relative hypotension, and volume loss from blood loss. I suspect that he has an element of acute tubular necrosis now. Agree with IV fluid hydration at the current rate. Also agree with obtaining renal ultrasound to exclude obstruction.  2. Chronic kidney disease, stage III. It appears that the patient's baseline creatinine is 1.6. Suspect that his chronic kidney disease is as a result of NSAID use and hypertension. We counseled the patient on avoiding further NSAID use. Ideally we would like to place the patient back on an ARB once his renal function improves, however, I agree with holding losartan at this point in time. We will also check SPEP and UPEP for further evaluation.  3. Hyponatremia. It appears that the patient has hypovolemic hyponatremia. Agree with IV fluid hydration with normal saline. Continue to monitor serum sodium.  4. Anemia of blood loss. The patient's hemoglobin has dropped significantly to 8.4 from 11.0. The patient is currently receiving PRBC transfusion. Continue to monitor blood counts after transfusion.   I will like to thank Dr. Royden Purl for this kind referral. Further plan as the patient progresses.  ____________________________ Tama High, MD mnl:drc D: 05/21/2011 14:49:57 ET T:  05/21/2011 15:11:33 ET JOB#: [id number]  cc: Tama High, MD, <Dictator> Mariah Milling Morrell Fluke MD ELECTRONICALLY SIGNED 06/22/2011 23:56

## 2014-07-30 NOTE — Op Note (Signed)
PATIENT NAME:  Jon Mendez, Jon Mendez MR#:  [id number] DATE OF BIRTH:  03/23/1960  DATE OF PROCEDURE:  05/19/2011  PREOPERATIVE DIAGNOSIS: Avascular necrosis left hip.   POSTOPERATIVE DIAGNOSIS: Avascular necrosis left hip.   PROCEDURE: Stryker hip replacement on the left.   SURGEON: Kathrene Alu., MD  FIRST ASSISTANT: Dorthula Matas, PA-C.   ANESTHESIA: General.   HISTORY: The patient had a long history of pain relative to his left hip. X-rays were consistent with avascular necrosis. Patient was ultimately brought in for total hip replacement due to his persistent and worsening discomfort.   DESCRIPTION OF PROCEDURE: Patient taken the Operating Room where satisfactory general anesthesia was achieved. The patient was turned to the lateral decubitus position with the left hip up. The left hip was then prepped and draped in the usual fashion for a total hip procedure. Patient incidentally was given 2 grams of Kefzol IV prior to the start of the procedure.   A slightly curved posterolateral incision was made. Dissection carried down through the subcutaneous tissue onto the gluteus maximus fascia and fascia lata. They were divided in line with the incision. Bleeding was controlled with coagulation cautery. A Charnley retractor was inserted into the depths of the wound.   The piriformis was divided at its attachment to the greater trochanter and the remaining external rotators were divided at their attachment to the greater trochanter and reflected over the sciatic nerve.   The capsule was divided in a T-shaped fashion. A posterior superior capsulectomy was performed.   Next, I inserted a smooth Steinmann pin several fingerbreadths superior to the lateral edge of the acetabulum. The pin was inserted directly into the ilium. It was bent at right angle so it could be used as a leg length indicator.   The femoral neck was then osteotomized about a fingerbreadth above the lesser trochanter.  I used the Accolade neck cutting guide to facilitate the correct angle for the osteotomy. Femoral head was inspected after it was removed. Indeed there was a significant indentation superiorly in the area of the avascular necrosis.   I went ahead and placed appropriate retractors into the acetabulum to facilitate better exposure. Sequentially reamed the acetabulum up to 56 mm. I then impacted a 56 mm cluster acetabular shell into the acetabulum. The shell was placed at about 45 degrees of abduction and about 15 degrees of forward flexion. It seemed to be quite stable. I further secured it with a 6.5 mm cancellous screw that was 25 mm in length.   Trial liner was inserted. I then broached the proximal femur to accommodate a #5 Accolade hip stem with a 132 degrees neck angle.   Trial reduction was performed with the #5 trial stem and the trial 40 mm femoral head with a -4 mm offset. This construct reduced into the acetabulum quite well and was stable.   I went ahead and removed the trial hip stem and I removed the trial liner. A permanent 40 mm 0 degrees polyethylene insert was impacted into the cluster acetabular shell.   Next, I impacted the permanent #5 Accolade hip stem into the proximal femur and then impacted the permanent 40 mm anatomic femoral head with a -4 mm offset onto the stem. I reduced the head in the acetabulum. It again moved well and was quite stable.   The wound was irrigated with GU irrigant. I reattached the external rotators to the greater trochanter through drill holes with #1 Orthocord sutures. I then went ahead  and repaired relaxing incision that I had made in the gluteus maximus insertion onto the proximal femur with #1 Ethibond sutures. Tubes were inserted into the depths of the wound. The gluteus maximus and fascia lata were closed with #1 Ethibond and #1 Vicryl sutures. Subcutaneous was closed with 0 and 2-0 Vicryl and the skin with skin staples. The smooth Steinmann pin had  previously been removed. I closed this puncture wound with a 3-0 nylon in vertical mattress fashion. Betadine was applied to the wounds followed by sterile dressing. Four TENS were placed about the hip and then an abduction pillow was placed between the patient's legs. The patient was then transferred to his hospital bed as he was turned supine. He was then awakened. He was taken to the recovery room in satisfactory condition. Blood loss was about 650 mL. No blood was transfused during the course of the procedure.   SUMMARY OF IMPLANTS USED: 56 mm cluster acetabular shell, one 6.5 cancellous bone screw 25 mm in length, 0 degree 40 mm polyethylene insert, #5 Accolade hip stem with a 132 degree neck angle, and a 40 mm anatomic femoral head with a -4 mm offset.   ____________________________ Kathrene Alu., MD hbk:cms D: 05/19/2011 22:23:57 ET T: 05/20/2011 09:40:18 ET JOB#: [id number]  cc: Kathrene Alu., MD, <Dictator> Kathrene Alu MD ELECTRONICALLY SIGNED 05/25/2011 14:59

## 2014-07-30 NOTE — Discharge Summary (Signed)
PATIENT NAME:  Jon Mendez, Jon Mendez MR#:  [id number] DATE OF BIRTH:  03/23/1960  DATE OF ADMISSION:  05/19/2011 DATE OF DISCHARGE:  05/23/2011  ADMITTING DIAGNOSIS: Status post left total hip replacement for avascular necrosis.   DISCHARGE DIAGNOSES:  1. Acute renal failure, improved.  2. Hyponatremia, improved.  3. Proteinuria.  4. Acute blood loss anemia.  5. Hypotension.   ATTENDING: Dr. Leanor Kail with [facility] orthopedics.   CONSULTING PHYSICIANS:  1. Dr. Anthonette Legato. 2. Dr. Deanne Coffer.  3. Dr. Epifanio Lesches.   PROCEDURE: On 05/19/2011 the patient underwent left total hip arthroplasty by Dr. Jefm Bryant with assistant Dorthula Matas, PA-C.   ANESTHESIA: General.   OPERATIVE FINDINGS: Avascular necrosis.   ESTIMATED BLOOD LOSS: 650 mL fluids.  DRAIN: Hemovac drain was placed.   IMPLANTS: Stryker.   COMPLICATIONS: No complications occurred.   HISTORY: Jon Mendez is a 54 year old who has a history of left hip avascular necrosis. The patient's pain had persisted and worsened over time. Dr. Jefm Bryant felt that total hip replacement would be appropriate to relieve his symptoms.   ALLERGIES AND ADVERSE REACTIONS: None.   PAST MEDICAL HISTORY:  1. Hypertension. 2. High cholesterol.   PHYSICAL EXAMINATION: HEART: Normal sinus rhythm without murmur. LUNGS: Clear to auscultation. LEFT HIP: He was minimally tender about his left hip. Left hip could be flexed to around 90 degrees but this produced pain. He had only a few degrees of internal and external rotation and this also produced pain. No significant intra-articular crepitus was appreciated. He had around 35 degrees of abduction but this also caused left hip discomfort. There was no malalignment or significant shortening. No obvious neurovascular deficit. X-rays at our clinic reveal avascular necrosis with some flattening of the left femoral head. There was only minimal change from previous films.    HOSPITAL COURSE: Patient underwent aforementioned procedure without complication and was transferred to the PAC-U and then the orthopedic floor in stable condition. The patient tolerated his diet well while here and did report passing some stool. He did tolerate physical therapy well and had ambulated even up to 250 feet towards the end of his stay. At first his urine output was low; his IV fluids were bumped up. The patient did have an elevated creatinine and low sodium level while here. IV fluids had to be altered. Medicine had been consulted. Patient did have some hypotension and so his blood pressure medications had to be held. The patient would be followed by medicine as well as renal. Patient's Hemovac drain was pulled on postoperative day two and his dressing was changed on postoperative day three. His left hip incision was intact with staples and no sign of infection. The patient did have to be transfused a unit of packed red blood cells a couple of times actually. His hemoglobin would end up at 8.6 on 02/15. It had been down to 7.6 at one point. Of note, the patient's hemoglobin A1c was found to be 7.2. Vitamin D level was low at 5.7. Of note, his uric acid was found to be within normal limits on 02/13 as was his TSH. His PTH was also within normal limits as was his phosphorus level.   CONDITION AT DISCHARGE: Stable.   DISPOSITION: Home with home health physical therapy.   DISCHARGE MEDICATIONS:  1. Oxycodone 5 mg 1 to 2 every four hours as needed for pain. 2. Lovenox 40 mg subcutaneous injection once a day.  3. Metoprolol 75 mg daily.   DISCHARGE INSTRUCTIONS  AND FOLLOW UP:  1. He is weight-bearing as tolerated on the left leg.  2. Will wear knee-high TED hose during the day.  3. He will not cross his left leg over. He will not flex his hip more than 90 degrees.  4. Regular diet without any concentrated sweets or sugar.  5. He will leave his left hip dressing on, keep it clean and dry.  He may use TENS unit as needed for pain although this had been discontinued during his hospital stay because unit wasn't working well for him.  6. He will call our office for any disturbing symptoms.  7. He will avoid taking Aleve, naproxen or ibuprofen because of his kidney issues.  8. He will not restart his losartan, HCTZ or Norvasc.  9. The patient will follow up at [place] on 02/26 at 8:15 for left hip staple removal.  10. He will follow up with Dr. Anthonette Legato on 06/03/2011 at 11:00 a.m.   ____________________________ [name], [place] jrp:cms D: 05/23/2011 17:23:32 ET T: 05/24/2011 14:54:49 ET JOB#: [id number]  cc: Jerrel Ivory. [place], [place], <Dictator> Halina Maidens, MD [place] PA ELECTRONICALLY SIGNED 05/30/2011 15:40

## 2014-09-22 ENCOUNTER — Other Ambulatory Visit: Payer: Self-pay | Admitting: Internal Medicine

## 2014-11-22 ENCOUNTER — Other Ambulatory Visit: Payer: Self-pay | Admitting: Internal Medicine

## 2014-11-22 ENCOUNTER — Encounter: Payer: Self-pay | Admitting: Internal Medicine

## 2014-11-22 DIAGNOSIS — M1A9XX0 Chronic gout, unspecified, without tophus (tophi): Secondary | ICD-10-CM | POA: Insufficient documentation

## 2014-11-22 DIAGNOSIS — E785 Hyperlipidemia, unspecified: Secondary | ICD-10-CM

## 2014-11-22 DIAGNOSIS — IMO0002 Reserved for concepts with insufficient information to code with codable children: Secondary | ICD-10-CM | POA: Insufficient documentation

## 2014-11-22 DIAGNOSIS — F17200 Nicotine dependence, unspecified, uncomplicated: Secondary | ICD-10-CM | POA: Insufficient documentation

## 2014-11-22 DIAGNOSIS — I8393 Asymptomatic varicose veins of bilateral lower extremities: Secondary | ICD-10-CM | POA: Insufficient documentation

## 2014-11-22 DIAGNOSIS — I10 Essential (primary) hypertension: Secondary | ICD-10-CM | POA: Insufficient documentation

## 2014-11-22 DIAGNOSIS — M1A00X0 Idiopathic chronic gout, unspecified site, without tophus (tophi): Secondary | ICD-10-CM | POA: Insufficient documentation

## 2014-11-22 DIAGNOSIS — E1165 Type 2 diabetes mellitus with hyperglycemia: Secondary | ICD-10-CM

## 2014-11-22 DIAGNOSIS — E1129 Type 2 diabetes mellitus with other diabetic kidney complication: Secondary | ICD-10-CM | POA: Insufficient documentation

## 2014-11-22 DIAGNOSIS — E1169 Type 2 diabetes mellitus with other specified complication: Secondary | ICD-10-CM | POA: Insufficient documentation

## 2014-12-01 ENCOUNTER — Other Ambulatory Visit: Payer: Self-pay | Admitting: Internal Medicine

## 2014-12-04 ENCOUNTER — Other Ambulatory Visit: Payer: Self-pay | Admitting: Internal Medicine

## 2014-12-04 MED ORDER — SITAGLIP PHOS-METFORMIN HCL ER 100-1000 MG PO TB24: 1.0000 | ORAL_TABLET | Freq: Every day | ORAL | Status: DC

## 2014-12-16 ENCOUNTER — Ambulatory Visit
Admission: EM | Admit: 2014-12-16 | Discharge: 2014-12-16 | Disposition: A | Discharge status: Home / Self Care | Payer: Managed Care, Other (non HMO) | Attending: Family Medicine | Admitting: Family Medicine

## 2014-12-16 DIAGNOSIS — H109 Unspecified conjunctivitis: Secondary | ICD-10-CM

## 2014-12-16 DIAGNOSIS — S0501XA Injury of conjunctiva and corneal abrasion without foreign body, right eye, initial encounter: Secondary | ICD-10-CM | POA: Diagnosis not present

## 2014-12-16 HISTORY — DX: Essential (primary) hypertension: I10

## 2014-12-16 HISTORY — DX: Pure hypercholesterolemia, unspecified: E78.00

## 2014-12-16 MED ORDER — CARBOXYMETHYLCELLULOSE SODIUM 1 % OP SOLN: 1.0000 [drp] | Freq: Three times a day (TID) | OPHTHALMIC | Status: DC

## 2014-12-16 MED ORDER — ERYTHROMYCIN 5 MG/GM OP OINT: TOPICAL_OINTMENT | OPHTHALMIC | Status: DC

## 2014-12-16 MED ORDER — SODIUM CHLORIDE 0.9 % IR SOLN: 250.0000 mL | Freq: Once | Status: DC

## 2014-12-16 NOTE — ED Notes (Signed)
R eye redness swelling, painful and itchy, feels like its pulsating at times,.  Has had this over the past several days. Denies any other symptoms.

## 2014-12-16 NOTE — Discharge Instructions (Signed)
Conjunctivitis Conjunctivitis is commonly called "pink eye." Conjunctivitis can be caused by bacterial or viral infection, allergies, or injuries. There is usually redness of the lining of the eye, itching, discomfort, and sometimes discharge. There may be deposits of matter along the eyelids. A viral infection usually causes a watery discharge, while a bacterial infection causes a yellowish, thick discharge. Pink eye is very contagious and spreads by direct contact. You may be given antibiotic eyedrops as part of your treatment. Before using your eye medicine, remove all drainage from the eye by washing gently with warm water and cotton balls. Continue to use the medication until you have awakened 2 mornings in a row without discharge from the eye. Do not rub your eye. This increases the irritation and helps spread infection. Use separate towels from other household members. Wash your hands with soap and water before and after touching your eyes. Use cold compresses to reduce pain and sunglasses to relieve irritation from light. Do not wear contact lenses or wear eye makeup until the infection is gone. SEEK MEDICAL CARE IF:   Your symptoms are not better after 3 days of treatment.  You have increased pain or trouble seeing.  The outer eyelids become very red or swollen. Document Released: 05/01/2004 Document Revised: 06/16/2011 Document Reviewed: 03/24/2005 [facility] Patient Information 2015 [place], [place]. This information is not intended to replace advice given to you by your health care provider. Make sure you discuss any questions you have with your health care provider. Corneal Abrasion The cornea is the clear covering at the front and center of the eye. When looking at the colored portion of the eye (iris), you are looking through the cornea. This very thin tissue is made up of many layers. The surface layer is a single layer of cells (corneal epithelium) and is one of the most sensitive tissues  in the body. If a scratch or injury causes the corneal epithelium to come off, it is called a corneal abrasion. If the injury extends to the tissues below the epithelium, the condition is called a corneal ulcer. CAUSES   Scratches.  Trauma.  Foreign body in the eye. Some people have recurrences of abrasions in the area of the original injury even after it has healed (recurrent erosion syndrome). Recurrent erosion syndrome generally improves and goes away with time. SYMPTOMS   Eye pain.  Difficulty or inability to keep the injured eye open.  The eye becomes very sensitive to light.  Recurrent erosions tend to happen suddenly, first thing in the morning, usually after waking up and opening the eye. DIAGNOSIS  Your health care provider can diagnose a corneal abrasion during an eye exam. Dye is usually placed in the eye using a drop or a small paper strip moistened by your tears. When the eye is examined with a special light, the abrasion shows up clearly because of the dye. TREATMENT   Small abrasions may be treated with antibiotic drops or ointment alone.  A pressure patch may be put over the eye. If this is done, follow your doctor's instructions for when to remove the patch. Do not drive or use machines while the eye patch is on. Judging distances is hard to do with a patch on. If the abrasion becomes infected and spreads to the deeper tissues of the cornea, a corneal ulcer can result. This is serious because it can cause corneal scarring. Corneal scars interfere with light passing through the cornea and cause a loss of vision in the  involved eye. HOME CARE INSTRUCTIONS  Use medicine or ointment as directed. Only take over-the-counter or prescription medicines for pain, discomfort, or fever as directed by your health care provider.  Do not drive or operate machinery if your eye is patched. Your ability to judge distances is impaired.  If your health care provider has given you a  follow-up appointment, it is very important to keep that appointment. Not keeping the appointment could result in a severe eye infection or permanent loss of vision. If there is any problem keeping the appointment, let your health care provider know. SEEK MEDICAL CARE IF:   You have pain, light sensitivity, and a scratchy feeling in one eye or both eyes.  Your pressure patch keeps loosening up, and you can blink your eye under the patch after treatment.  Any kind of discharge develops from the eye after treatment or if the lids stick together in the morning.  You have the same symptoms in the morning as you did with the original abrasion days, weeks, or months after the abrasion healed. MAKE SURE YOU:   Understand these instructions.  Will watch your condition.  Will get help right away if you are not doing well or get worse. Document Released: 03/21/2000 Document Revised: 03/29/2013 Document Reviewed: 11/29/2012 [facility] Patient Information 2015 [place], [place]. This information is not intended to replace advice given to you by your health care provider. Make sure you discuss any questions you have with your health care provider.

## 2014-12-17 NOTE — ED Provider Notes (Signed)
CSN: [id number]     Arrival date & time 12/16/14  1204 History   First MD Initiated Contact with Patient 12/16/14 1229     Chief Complaint  Patient presents with  . Eye Pain   (Consider location/radiation/quality/duration/timing/severity/associated sxs/prior Treatment) HPI Comments: Married african [place] male here with spouse for evaluation of blurred vision, red eye right since he rubbed his eye Monday at work (5 days ago) patient thinks he may have had dried gasoline on his hands as eye started burning immediately after rubbing it.  Denied trauma or foreign body.  Does not wear contacts.  Glasses for close reading only.  Eye has progressively gotten redder and blurring with medial vision and haziness with lateral vision right eye.  Has tried OTC eye drops for eye irritation without any relief of symptoms.   Denied discharge, loss of vision, headache, fever, chills.  Patient is a 54 y.o. male presenting with eye pain. The history is provided by the patient and the spouse.  Eye Pain This is a new problem. The current episode started more than 2 days ago. The problem occurs constantly. The problem has been gradually worsening. Pertinent negatives include no chest pain, no abdominal pain, no headaches and no shortness of breath. The symptoms are aggravated by exertion. Nothing relieves the symptoms. He has tried a cold compress, rest, food and water for the symptoms. The treatment provided no relief.    Past Medical History  Diagnosis Date  . Hypertension   . High cholesterol    Past Surgical History  Procedure Laterality Date  . Hip arthroplasty Left 2013  . Varicose vein surgery  2015  . Joint replacement     Family History  Problem Relation Age of Onset  . Diabetes Sister   . Diabetes Brother   . Diabetes Brother   . Hypertension Mother   . Lymphoma Father    Social History  Substance Use Topics  . Smoking status: Current Every Day Smoker -- 0.50 packs/day for 30 years   Types: Cigarettes  . Smokeless tobacco: None  . Alcohol Use: 2.4 oz/week    4 Standard drinks or equivalent per week    Review of Systems  Constitutional: Negative for fever, chills, diaphoresis, activity change, appetite change, fatigue and unexpected weight change.  HENT: Negative for congestion, dental problem, drooling, ear discharge, ear pain, facial swelling, hearing loss, mouth sores, nosebleeds, postnasal drip, rhinorrhea, sinus pressure, sneezing, sore throat, tinnitus, trouble swallowing and voice change.   Eyes: Positive for photophobia, pain, redness and visual disturbance. Negative for discharge and itching.  Respiratory: Negative for apnea, cough, choking, chest tightness, shortness of breath, wheezing and stridor.   Cardiovascular: Negative for chest pain, palpitations and leg swelling.  Gastrointestinal: Negative for nausea, vomiting, abdominal pain, diarrhea, constipation, blood in stool and abdominal distention.  Endocrine: Negative for cold intolerance and heat intolerance.  Genitourinary: Negative for dysuria.  Musculoskeletal: Negative for myalgias, back pain, joint swelling, arthralgias, gait problem, neck pain and neck stiffness.  Skin: Negative for color change, pallor, rash and wound.  Allergic/Immunologic: Negative for environmental allergies and food allergies.  Neurological: Negative for dizziness, tremors, seizures, syncope, facial asymmetry, speech difficulty, weakness, light-headedness, numbness and headaches.  Hematological: Negative for adenopathy. Does not bruise/bleed easily.  Psychiatric/Behavioral: Negative for behavioral problems, confusion, sleep disturbance and agitation.    Allergies  Eggs or egg-derived products  Home Medications   Prior to Admission medications   Medication Sig Start Date End Date Taking? Authorizing Provider  allopurinol (ZYLOPRIM) 100 MG tablet Take 1 tablet by mouth daily. 01/03/14   Historical Provider, MD  amLODipine  (NORVASC) 5 MG tablet TAKE ONE TABLET BY MOUTH ONCE DAILY 09/22/14   Glean Hess, MD  carboxymethylcellulose 1 % ophthalmic solution Apply 1 drop to eye 3 (three) times daily. 12/16/14   Olen Cordial, NP  erythromycin ophthalmic ointment Place a 1/2 inch ribbon of ointment into the lower eyelid right eye QID 12/16/14   Olen Cordial, NP  glucose blood (FREESTYLE LITE) test strip FREESTYLE LITE TEST (In Vitro Strip)  1 (one) Strip Strip two times daily for 50 days  Quantity: 100;  Refills: 5   Ordered :10-Nov-2012  Alison Stalling ;  Started 06-October-2012 Active Comments: dx: 250.02 dispense covered supplies 10/06/12   Historical Provider, MD  losartan-hydrochlorothiazide (HYZAAR) 100-25 MG per tablet Take 1 tablet by mouth daily. 08/29/14   Historical Provider, MD  pravastatin (PRAVACHOL) 40 MG tablet Take 1 tablet by mouth at bedtime. 08/10/14   Historical Provider, MD  SitaGLIPtin-MetFORMIN HCl (JANUMET XR) [phone] MG TB24 Take 1 tablet by mouth daily. 12/04/14   Glean Hess, MD   Meds Ordered and Administered this Visit  Medications - No data to display  BP 136/74 mmHg  Pulse 77  Temp(Src) 98.1 F (36.7 C) (Oral)  Resp 20  Ht '5\' 11"'$  (1.803 m)  Wt 270 lb (122.471 kg)  BMI 37.67 kg/m2  SpO2 99% No data found.   Physical Exam  Constitutional: He is oriented to person, place, and time. Vital signs are normal. He appears well-developed and well-nourished. No distress.  HENT:  Head: Normocephalic and atraumatic.  Right Ear: Hearing, tympanic membrane, external ear and ear canal normal.  Left Ear: Hearing, tympanic membrane, external ear and ear canal normal.  Nose: Nose normal. No mucosal edema, rhinorrhea, nose lacerations, sinus tenderness, nasal deformity, septal deviation or nasal septal hematoma. No epistaxis.  No foreign bodies. Right sinus exhibits no maxillary sinus tenderness and no frontal sinus tenderness. Left sinus exhibits no maxillary sinus tenderness and no  frontal sinus tenderness.  Mouth/Throat: Uvula is midline, oropharynx is clear and moist and mucous membranes are normal. Mucous membranes are not pale, not dry and not cyanotic. He does not have dentures. No oral lesions. No trismus in the jaw. Normal dentition. No dental abscesses, uvula swelling, lacerations or dental caries. No oropharyngeal exudate, posterior oropharyngeal edema, posterior oropharyngeal erythema or tonsillar abscesses.  Eyes: EOM and lids are normal. Right eye exhibits chemosis. Right eye exhibits no discharge, no exudate and no hordeolum. No foreign body present in the right eye. Left eye exhibits no chemosis, no discharge, no exudate and no hordeolum. No foreign body present in the left eye. Right conjunctiva is injected. Right conjunctiva has no hemorrhage. Left conjunctiva is not injected. Left conjunctiva has no hemorrhage. No scleral icterus. Right eye exhibits normal extraocular motion and no nystagmus. Left eye exhibits normal extraocular motion and no nystagmus. Right pupil is round and reactive. Left pupil is round and reactive. Pupils are equal.  Fundoscopic exam:      The right eye shows no exudate, no hemorrhage and no papilledema.       The left eye shows no exudate, no hemorrhage and no papilledema.  Slit lamp exam:      The right eye shows corneal abrasion and fluorescein uptake. The right eye shows no corneal flare, no foreign body, no hyphema, no hypopyon and no anterior chamber bulge.  The left eye shows no corneal abrasion, no corneal flare, no corneal ulcer, no foreign body, no hyphema, no hypopyon, no fluorescein uptake and no anterior chamber bulge.    Injection 4+ right bulbar and eyelid conjunctiva blood vessels excoriated; chemosis lower right 1/3 globe  Neck: Trachea normal and normal range of motion. Neck supple. No tracheal tenderness, no spinous process tenderness and no muscular tenderness present. No rigidity. No tracheal deviation, no edema, no  erythema and normal range of motion present. No thyromegaly present.  Cardiovascular: Normal rate, regular rhythm, normal heart sounds and intact distal pulses.  Exam reveals no gallop and no friction rub.   No murmur heard. Pulmonary/Chest: Effort normal and breath sounds normal. No stridor. No respiratory distress. He has no wheezes. He has no rales.  Abdominal: Soft. He exhibits no distension.  Musculoskeletal: Normal range of motion. He exhibits no edema or tenderness.  Lymphadenopathy:    He has no cervical adenopathy.  Neurological: He is alert and oriented to person, place, and time. He displays no atrophy and no tremor. No cranial nerve deficit or sensory deficit. He exhibits normal muscle tone. He displays no seizure activity. Coordination and gait normal. GCS eye subscore is 4. GCS verbal subscore is 5. GCS motor subscore is 6.  Skin: Skin is warm, dry and intact. No abrasion, no bruising, no burn, no ecchymosis, no laceration, no lesion, no petechiae and no rash noted. He is not diaphoretic. No cyanosis or erythema. No pallor. Nails show no clubbing.  Psychiatric: He has a normal mood and affect. His speech is normal and behavior is normal. Judgment and thought content normal. Cognition and memory are normal.  Nursing note and vitals reviewed.   ED Course  Procedures (including critical care time)  Labs Review Labs Reviewed - No data to display  Imaging Review No results found.   Visual Acuity Review  Right Eye Distance: 20/30 without correction Left Eye Distance: 20/25 without correction Bilateral Distance:     1230 fluoerescein and woods lamp evaluation completed.  Patient reported eye discomfort resolved with tetracaine administration.  Corneal abrasion noted medial right eye.  Case discussed with Dr Alveta Heimlich sterile saline eye irrigation ordered with montgomery lens performed by Vanessa Big Beaver.  1300 patient reported feeling ok.  Will follow up with [place] Monday 12  Sep ER this weekend [facility] or [facility] if worsening vision, eye pain, headache, eyelid swelling, eye discharge, visual loss.  Patient and spouse verbalized understanding of information/instructions, agreed with plan of care and had no further questions at this time.    MDM   1. Corneal abrasion, right, initial encounter   2. Conjunctivitis of right eye    Call [facility] Monday for appt/re-evaluation.  Discussed with patient RN will follow up with him 12 Sep also to ensure if assistance with optometry evaluation scheduling required.  Patient to contact me if any questions over the weekend.  Avoid scratching/rubbing eyes.  Eye rest this weekend.  Erythromycin opthalmic QID right eye and refresh gtts 2 TID.  Patient to apply warm packs prn as directed.  Instructed patient to not rub eyes.  May need to wash pillowcases more frequently until infection resolves.  Return to clinic if headache, fever greater than 100.43F, nausea/vomiting, purulent discharge/matting unable to open eye without using fingers, foreign body sensation, ciliary flush, worsening photophobia or vision changes/loss.  Call or return to clinic as needed if these symptoms worsen or fail to improve as anticipated.  Patient  given [facility] handout on corneal abrasion/conjunctivitis.  Patient and spouse verbalized agreement and understanding of treatment plan and had no further questions at this time   P2:  Hand washing   Olen Cordial, NP 12/17/14 2023

## 2014-12-18 ENCOUNTER — Telehealth: Payer: Self-pay

## 2014-12-18 NOTE — ED Notes (Signed)
Spoke to pt's wife, she states he is at work, and his number is [phone]. Writer attempted to call, no answer, left message of importance for him to f/u with  [facility], number given.

## 2014-12-19 ENCOUNTER — Other Ambulatory Visit
Admission: RE | Admit: 2014-12-19 | Discharge: 2014-12-19 | Disposition: A | Discharge status: Home / Self Care | Payer: Managed Care, Other (non HMO) | Source: Other Acute Inpatient Hospital | Attending: Ophthalmology | Admitting: Ophthalmology

## 2014-12-19 DIAGNOSIS — H16001 Unspecified corneal ulcer, right eye: Secondary | ICD-10-CM | POA: Diagnosis not present

## 2014-12-22 LAB — EYE CULTURE: CULTURE: NO GROWTH

## 2015-01-01 ENCOUNTER — Encounter: Payer: Self-pay | Admitting: Internal Medicine

## 2015-01-05 ENCOUNTER — Encounter: Payer: Self-pay | Admitting: Internal Medicine

## 2015-01-10 LAB — FUNGUS CULTURE W SMEAR

## 2015-01-29 ENCOUNTER — Other Ambulatory Visit: Payer: Self-pay | Admitting: Internal Medicine

## 2015-02-26 ENCOUNTER — Other Ambulatory Visit: Payer: Self-pay | Admitting: Internal Medicine

## 2015-03-30 ENCOUNTER — Other Ambulatory Visit: Payer: Self-pay | Admitting: Internal Medicine

## 2015-04-25 ENCOUNTER — Ambulatory Visit (INDEPENDENT_AMBULATORY_CARE_PROVIDER_SITE_OTHER): Payer: Managed Care, Other (non HMO) | Admitting: Internal Medicine

## 2015-04-25 ENCOUNTER — Encounter: Payer: Self-pay | Admitting: Internal Medicine

## 2015-04-25 VITALS — BP 104/68 | HR 76 | Ht 71.0 in | Wt 283.0 lb

## 2015-04-25 DIAGNOSIS — N183 Chronic kidney disease, stage 3 unspecified: Secondary | ICD-10-CM

## 2015-04-25 DIAGNOSIS — M1000 Idiopathic gout, unspecified site: Secondary | ICD-10-CM | POA: Diagnosis not present

## 2015-04-25 DIAGNOSIS — E1165 Type 2 diabetes mellitus with hyperglycemia: Secondary | ICD-10-CM | POA: Diagnosis not present

## 2015-04-25 DIAGNOSIS — E1121 Type 2 diabetes mellitus with diabetic nephropathy: Secondary | ICD-10-CM | POA: Diagnosis not present

## 2015-04-25 DIAGNOSIS — N189 Chronic kidney disease, unspecified: Secondary | ICD-10-CM

## 2015-04-25 DIAGNOSIS — N1832 Chronic kidney disease, stage 3b: Secondary | ICD-10-CM | POA: Insufficient documentation

## 2015-04-25 DIAGNOSIS — I10 Essential (primary) hypertension: Secondary | ICD-10-CM | POA: Diagnosis not present

## 2015-04-25 DIAGNOSIS — Z125 Encounter for screening for malignant neoplasm of prostate: Secondary | ICD-10-CM

## 2015-04-25 DIAGNOSIS — Z1211 Encounter for screening for malignant neoplasm of colon: Secondary | ICD-10-CM

## 2015-04-25 DIAGNOSIS — E785 Hyperlipidemia, unspecified: Secondary | ICD-10-CM

## 2015-04-25 DIAGNOSIS — Z1159 Encounter for screening for other viral diseases: Secondary | ICD-10-CM

## 2015-04-25 DIAGNOSIS — Z0000 Encounter for general adult medical examination without abnormal findings: Secondary | ICD-10-CM

## 2015-04-25 DIAGNOSIS — N1831 Chronic kidney disease, stage 3a: Secondary | ICD-10-CM | POA: Insufficient documentation

## 2015-04-25 DIAGNOSIS — IMO0002 Reserved for concepts with insufficient information to code with codable children: Secondary | ICD-10-CM

## 2015-04-25 MED ORDER — AMLODIPINE BESYLATE 5 MG PO TABS: 5.0000 mg | ORAL_TABLET | Freq: Every day | ORAL | Status: DC

## 2015-04-25 MED ORDER — PRAVASTATIN SODIUM 40 MG PO TABS: 40.0000 mg | ORAL_TABLET | Freq: Every day | ORAL | Status: DC

## 2015-04-25 MED ORDER — ALLOPURINOL 100 MG PO TABS: 100.0000 mg | ORAL_TABLET | Freq: Every day | ORAL | Status: DC

## 2015-04-25 MED ORDER — LOSARTAN POTASSIUM-HCTZ 100-25 MG PO TABS: 1.0000 | ORAL_TABLET | Freq: Every day | ORAL | Status: DC

## 2015-04-25 MED ORDER — SITAGLIP PHOS-METFORMIN HCL ER 100-1000 MG PO TB24: 1.0000 | ORAL_TABLET | Freq: Every day | ORAL | Status: DC

## 2015-04-25 NOTE — Progress Notes (Signed)
Date:  04/25/2015   Name:  Jon Mendez   DOB:  03/23/1960   MRN:  [id number]   Chief Complaint: Annual Exam; Hypertension; Diabetes; and Hyperlipidemia Jon Mendez is a 55 y.o. male who presents today for his Complete Annual Exam. He feels well. He reports exercising rarely but has a physical job. He reports he is sleeping well.   Hypertension This is a chronic problem. The current episode started more than 1 year ago. The problem is unchanged. The problem is controlled. Pertinent negatives include no chest pain, headaches, palpitations or shortness of breath. Past treatments include calcium channel blockers, angiotensin blockers and diuretics. The current treatment provides significant improvement.  Diabetes He presents for his follow-up diabetic visit. He has type 2 diabetes mellitus. His disease course has been stable. Pertinent negatives for hypoglycemia include no headaches or tremors. Pertinent negatives for diabetes include no chest pain, no fatigue, no polydipsia and no polyuria. Current diabetic treatment includes oral agent (dual therapy). He is compliant with treatment all of the time. His breakfast blood glucose is taken between 6-7 am. His breakfast blood glucose range is generally 90-110 mg/dl.  Hyperlipidemia This is a chronic problem. The current episode started more than 1 year ago. The problem is controlled. Recent lipid tests were reviewed and are normal. Pertinent negatives include no chest pain, myalgias or shortness of breath. Current antihyperlipidemic treatment includes statins. The current treatment provides significant improvement of lipids.     Review of Systems  Constitutional: Negative for fever, chills and fatigue.  HENT: Negative for hearing loss, tinnitus, trouble swallowing and voice change.   Eyes: Negative for visual disturbance.  Respiratory: Negative for cough, choking and shortness of breath.   Cardiovascular: Negative for chest pain,  palpitations and leg swelling.  Gastrointestinal: Negative for abdominal pain, diarrhea, constipation and blood in stool.  Endocrine: Negative for polydipsia and polyuria.  Genitourinary: Negative for dysuria, urgency and hematuria.  Musculoskeletal: Negative for myalgias.  Skin: Positive for wound (intermittent hydradenitis flares). Negative for color change and rash.  Neurological: Negative for tremors, numbness and headaches.  Hematological: Negative for adenopathy. Does not bruise/bleed easily.  Psychiatric/Behavioral: Negative for sleep disturbance, dysphoric mood and decreased concentration.    Patient Active Problem List   Diagnosis Date Noted  . Chronic gouty arthritis 11/22/2014  . Type II diabetes mellitus with renal manifestations, uncontrolled ([place]) 11/22/2014  . Dyslipidemia 11/22/2014  . Essential (primary) hypertension 11/22/2014  . Compulsive tobacco user syndrome 11/22/2014  . Leg varicosity w ulcer ([place]) 11/22/2014  . Acne inversa 09/06/2012  . Benign paroxysmal positional nystagmus 07/29/2004    Prior to Admission medications   Medication Sig Start Date End Date Taking? Authorizing Provider  allopurinol (ZYLOPRIM) 100 MG tablet TAKE ONE TABLET BY MOUTH ONCE DAILY 01/29/15  Yes Glean Hess, MD  amLODipine (NORVASC) 5 MG tablet TAKE ONE TABLET BY MOUTH ONCE DAILY 03/30/15  Yes Glean Hess, MD  glucose blood (FREESTYLE LITE) test strip FREESTYLE LITE TEST (In Vitro Strip)  1 (one) Strip Strip two times daily for 50 days  Quantity: 100;  Refills: 5   Ordered :10-Nov-2012  Alison Stalling ;  Started 06-October-2012 Active Comments: dx: 250.02 dispense covered supplies 10/06/12  Yes Historical Provider, MD  losartan-hydrochlorothiazide (HYZAAR) 100-25 MG per tablet Take 1 tablet by mouth daily. 08/29/14  Yes Historical Provider, MD  pravastatin (PRAVACHOL) 40 MG tablet TAKE ONE TABLET BY MOUTH AT BEDTIME 02/26/15  Yes Glean Hess, MD  SitaGLIPtin-MetFORMIN HCl  (JANUMET XR) [id number] MG TB24 Take 1 tablet by mouth daily. 12/04/14  Yes Glean Hess, MD    Allergies  Allergen Reactions  . Eggs Or Egg-Derived Products   . Fish Allergy     Past Surgical History  Procedure Laterality Date  . Hip arthroplasty Left 2013  . Varicose vein surgery  2015  . Joint replacement      Social History  Substance Use Topics  . Smoking status: Current Every Day Smoker -- 0.25 packs/day for 30 years    Types: Cigarettes  . Smokeless tobacco: None  . Alcohol Use: No   Lab Results  Component Value Date   HGBA1C 6.3* 06/16/2014   Lab Results  Component Value Date   CREATININE 1.6* 06/16/2014   Lab Results  Component Value Date   CHOL 207* 11/10/2012   HDL 34* 11/10/2012   LDLCALC 133 11/10/2012   TRIG 201* 11/10/2012     Medication list has been reviewed and updated.   Physical Exam  Constitutional: He is oriented to person, place, and time. He appears well-developed and well-nourished.  HENT:  Head: Normocephalic.  Right Ear: Tympanic membrane, external ear and ear canal normal.  Left Ear: Tympanic membrane, external ear and ear canal normal.  Nose: Nose normal.  Mouth/Throat: Uvula is midline and oropharynx is clear and moist.  Eyes: Conjunctivae and EOM are normal. Pupils are equal, round, and reactive to light.  Neck: Normal range of motion. Neck supple. Carotid bruit is not present. No thyromegaly present.  Cardiovascular: Normal rate, regular rhythm, normal heart sounds and intact distal pulses.   Pulmonary/Chest: Effort normal and breath sounds normal. He has no wheezes. Right breast exhibits no mass. Left breast exhibits no mass.  Abdominal: Soft. Normal appearance and bowel sounds are normal. There is no hepatosplenomegaly. There is no tenderness.  Musculoskeletal: Normal range of motion. He exhibits no edema or tenderness.  Lymphadenopathy:    He has no cervical adenopathy.  Neurological: He is alert and oriented to person,  place, and time. He has normal reflexes.  Skin: Skin is warm, dry and intact. Lesion (red, warm lesion on left earlobe) noted.  Scattered areas of cystic skin changes from prior infections on abdomen and buttocks  Psychiatric: He has a normal mood and affect. His speech is normal and behavior is normal. Judgment and thought content normal.  Nursing note and vitals reviewed.   BP 104/68 mmHg  Pulse 76  Ht '5\' 11"'$  (1.803 m)  Wt 283 lb (128.368 kg)  BMI 39.49 kg/m2  Assessment and Plan: 1. Annual physical exam Continue regular eye exams Will refer for colonoscopy - POCT urinalysis dipstick  2. Essential (primary) hypertension controlled - losartan-hydrochlorothiazide (HYZAAR) 100-25 MG tablet; Take 1 tablet by mouth daily.  Dispense: 30 tablet; Refill: 12 - amLODipine (NORVASC) 5 MG tablet; Take 1 tablet (5 mg total) by mouth daily.  Dispense: 30 tablet; Refill: 12 - CBC with Differential/Platelet - Comprehensive metabolic panel  3. Uncontrolled type 2 diabetes mellitus with diabetic nephropathy, without long-term current use of insulin ([place]) Continue current therapy Savings card given - SitaGLIPtin-MetFORMIN HCl (JANUMET XR) [phone] MG TB24; Take 1 tablet by mouth daily.  Dispense: 30 tablet; Refill: 12 - Hemoglobin A1c - Microalbumin / creatinine urine ratio  4. Dyslipidemia On statin therapy - pravastatin (PRAVACHOL) 40 MG tablet; Take 1 tablet (40 mg total) by mouth at bedtime.  Dispense: 30 tablet; Refill: 12 - Lipid panel  5. Chronic renal insufficiency,  stage III (moderate) Continue to monitor  6. Prostate cancer screening DRE deferred to lack of symptoms - PSA  7. Colon cancer screening - Ambulatory referral to Gastroenterology  8. Need for hepatitis C screening test - Hepatitis C antibody  9. Idiopathic gout, unspecified chronicity, unspecified site No recent episodes - allopurinol (ZYLOPRIM) 100 MG tablet; Take 1 tablet (100 mg total) by mouth daily.   Dispense: 30 tablet; Refill: [place], MD [facility]  04/25/2015

## 2015-04-26 ENCOUNTER — Telehealth: Payer: Self-pay

## 2015-04-26 LAB — LIPID PANEL
Chol/HDL Ratio: 4.1 ratio units (ref 0.0–5.0)
Cholesterol, Total: 165 mg/dL (ref 100–199)
HDL: 40 mg/dL (ref >39)
LDL Calculated: 104 mg/dL — ABNORMAL HIGH (ref 0–99)
Triglycerides: 107 mg/dL (ref 0–149)
VLDL Cholesterol Cal: 21 mg/dL (ref 5–40)

## 2015-04-26 LAB — COMPREHENSIVE METABOLIC PANEL
A/G RATIO: 1.2 (ref 1.1–2.5)
ALBUMIN: 4.4 g/dL (ref 3.5–5.5)
ALT: 17 IU/L (ref 0–44)
AST: 21 IU/L (ref 0–40)
Alkaline Phosphatase: 102 IU/L (ref 39–117)
BILIRUBIN TOTAL: 0.6 mg/dL (ref 0.0–1.2)
BUN / CREAT RATIO: 15 (ref 9–20)
BUN: 23 mg/dL (ref 6–24)
CALCIUM: 9.4 mg/dL (ref 8.7–10.2)
CHLORIDE: 96 mmol/L (ref 96–106)
CO2: 20 mmol/L (ref 18–29)
Creatinine, Ser: 1.57 mg/dL — ABNORMAL HIGH (ref 0.76–1.27)
GFR, EST AFRICAN AMERICAN: 57 mL/min/{1.73_m2} — AB (ref >59)
GFR, EST NON AFRICAN AMERICAN: 49 mL/min/{1.73_m2} — AB (ref >59)
GLOBULIN, TOTAL: 3.7 g/dL (ref 1.5–4.5)
Glucose: 82 mg/dL (ref 65–99)
POTASSIUM: 4.9 mmol/L (ref 3.5–5.2)
SODIUM: 134 mmol/L (ref 134–144)
TOTAL PROTEIN: 8.1 g/dL (ref 6.0–8.5)

## 2015-04-26 LAB — CBC WITH DIFFERENTIAL/PLATELET
BASOS ABS: 0.1 10*3/uL (ref 0.0–0.2)
Basos: 0 %
EOS (ABSOLUTE): 0.3 10*3/uL (ref 0.0–0.4)
Eos: 2 %
Hematocrit: 41.2 % (ref 37.5–51.0)
Hemoglobin: 13.6 g/dL (ref 12.6–17.7)
IMMATURE GRANS (ABS): 0 10*3/uL (ref 0.0–0.1)
IMMATURE GRANULOCYTES: 0 %
LYMPHS: 22 %
Lymphocytes Absolute: 2.7 10*3/uL (ref 0.7–3.1)
MCH: 25.6 pg — AB (ref 26.6–33.0)
MCHC: 33 g/dL (ref 31.5–35.7)
MCV: 78 fL — AB (ref 79–97)
Monocytes Absolute: 0.9 10*3/uL (ref 0.1–0.9)
Monocytes: 8 %
NEUTROS PCT: 68 %
Neutrophils Absolute: 8.3 10*3/uL — ABNORMAL HIGH (ref 1.4–7.0)
PLATELETS: 301 10*3/uL (ref 150–379)
RBC: 5.31 x10E6/uL (ref 4.14–5.80)
RDW: 15.8 % — ABNORMAL HIGH (ref 12.3–15.4)
WBC: 12.3 10*3/uL — AB (ref 3.4–10.8)

## 2015-04-26 LAB — HEMOGLOBIN A1C
ESTIMATED AVERAGE GLUCOSE: 137 mg/dL
HEMOGLOBIN A1C: 6.4 % — AB (ref 4.8–5.6)

## 2015-04-26 LAB — MICROALBUMIN / CREATININE URINE RATIO
Creatinine, Urine: 148.1 mg/dL
MICROALB/CREAT RATIO: 9.7 mg/g{creat} (ref 0.0–30.0)
MICROALBUM., U, RANDOM: 14.4 ug/mL

## 2015-04-26 LAB — HEPATITIS C ANTIBODY: Hep C Virus Ab: <0.1 s/co ratio (ref 0.0–0.9)

## 2015-04-26 LAB — PSA: PROSTATE SPECIFIC AG, SERUM: 0.7 ng/mL (ref 0.0–4.0)

## 2015-04-26 NOTE — Telephone Encounter (Signed)
Left message for patient to call back  

## 2015-04-26 NOTE — Telephone Encounter (Signed)
-----   Message from Glean Hess, MD sent at 04/26/2015  8:02 AM EST ----- DM is good.  Kidney function is slightly decreased but stable.  Other labs are normal except for mild increase in WBC - may be due to cyst on ear.  If worsening, call for an antibiotic.

## 2015-04-27 ENCOUNTER — Other Ambulatory Visit: Payer: Self-pay

## 2015-04-27 ENCOUNTER — Telehealth: Payer: Self-pay

## 2015-04-27 NOTE — Telephone Encounter (Signed)
Left message to call back  

## 2015-04-27 NOTE — Telephone Encounter (Signed)
Gastroenterology Pre-Procedure Review  Request Date: 06/04/15 Requesting Physician: Dr. Army Melia  PATIENT REVIEW QUESTIONS: The patient responded to the following health history questions as indicated:    1. Are you having any GI issues? no 2. Do you have a personal history of Polyps? no 3. Do you have a family history of Colon Cancer or Polyps? no 4. Diabetes Mellitus? yes (Type 2) 5. Joint replacements in the past 12 months?no 6. Major health problems in the past 3 months?no 7. Any artificial heart valves, MVP, or defibrillator?no    MEDICATIONS & ALLERGIES:    Patient reports the following regarding taking any anticoagulation/antiplatelet therapy:   Plavix, Coumadin, Eliquis, Xarelto, Lovenox, Pradaxa, Brilinta, or Effient? no Aspirin? no  Patient confirms/reports the following medications:  Current Outpatient Prescriptions  Medication Sig Dispense Refill  . allopurinol (ZYLOPRIM) 100 MG tablet Take 1 tablet (100 mg total) by mouth daily. 30 tablet 12  . amLODipine (NORVASC) 5 MG tablet Take 1 tablet (5 mg total) by mouth daily. 30 tablet 12  . glucose blood (FREESTYLE LITE) test strip FREESTYLE LITE TEST (In Vitro Strip)  1 (one) Strip Strip two times daily for 50 days  Quantity: 100;  Refills: 5   Ordered :10-Nov-2012  Alison Stalling ;  Started 06-October-2012 Active Comments: dx: 250.02 dispense covered supplies    . losartan-hydrochlorothiazide (HYZAAR) 100-25 MG tablet Take 1 tablet by mouth daily. 30 tablet 12  . pravastatin (PRAVACHOL) 40 MG tablet Take 1 tablet (40 mg total) by mouth at bedtime. 30 tablet 12  . SitaGLIPtin-MetFORMIN HCl (JANUMET XR) [phone] MG TB24 Take 1 tablet by mouth daily. 30 tablet 12   No current facility-administered medications for this visit.    Patient confirms/reports the following allergies:  Allergies  Allergen Reactions  . Eggs Or Egg-Derived Products   . Fish Allergy     No orders of the defined types were placed in this  encounter.    AUTHORIZATION INFORMATION Primary Insurance: 1D#: Group #:  Secondary Insurance: 1D#: Group #:  SCHEDULE INFORMATION: Date: 06/04/15 Time: Location: [place]

## 2015-04-30 NOTE — Telephone Encounter (Signed)
Spoke with pt. Wife. Advised of all results and verbalized understanding. Patient is to call with any questions or concerns.

## 2015-05-01 ENCOUNTER — Other Ambulatory Visit: Payer: Self-pay | Admitting: Internal Medicine

## 2015-05-01 DIAGNOSIS — E1121 Type 2 diabetes mellitus with diabetic nephropathy: Secondary | ICD-10-CM

## 2015-05-01 DIAGNOSIS — IMO0002 Reserved for concepts with insufficient information to code with codable children: Secondary | ICD-10-CM

## 2015-05-01 DIAGNOSIS — E1165 Type 2 diabetes mellitus with hyperglycemia: Principal | ICD-10-CM

## 2015-05-01 MED ORDER — SITAGLIP PHOS-METFORMIN HCL ER 100-1000 MG PO TB24: 1.0000 | ORAL_TABLET | Freq: Every day | ORAL | Status: DC

## 2015-05-01 MED ORDER — SULFAMETHOXAZOLE-TRIMETHOPRIM 800-160 MG PO TABS: 1.0000 | ORAL_TABLET | Freq: Two times a day (BID) | ORAL | Status: DC

## 2015-05-03 ENCOUNTER — Telehealth: Payer: Self-pay | Admitting: Gastroenterology

## 2015-05-03 NOTE — Telephone Encounter (Signed)
No pre certification is required for CPT: [phone] with [employer] per the voice response system. Reference number: [id number].

## 2015-05-28 ENCOUNTER — Encounter: Payer: Self-pay | Admitting: *Deleted

## 2015-06-01 NOTE — Discharge Instructions (Signed)

## 2015-06-04 ENCOUNTER — Ambulatory Visit: Payer: Managed Care, Other (non HMO) | Admitting: Student in an Organized Health Care Education/Training Program

## 2015-06-04 ENCOUNTER — Ambulatory Visit
Admission: RE | Admit: 2015-06-04 | Discharge: 2015-06-04 | Disposition: A | Discharge status: Home / Self Care | Payer: Managed Care, Other (non HMO) | Source: Ambulatory Visit | Attending: Gastroenterology | Admitting: Gastroenterology

## 2015-06-04 ENCOUNTER — Encounter
Admission: RE | Disposition: A | Discharge status: Home / Self Care | Payer: Self-pay | Source: Ambulatory Visit | Attending: Gastroenterology

## 2015-06-04 DIAGNOSIS — D122 Benign neoplasm of ascending colon: Secondary | ICD-10-CM | POA: Insufficient documentation

## 2015-06-04 DIAGNOSIS — Z1211 Encounter for screening for malignant neoplasm of colon: Secondary | ICD-10-CM | POA: Diagnosis not present

## 2015-06-04 DIAGNOSIS — K635 Polyp of colon: Secondary | ICD-10-CM | POA: Diagnosis not present

## 2015-06-04 DIAGNOSIS — D126 Benign neoplasm of colon, unspecified: Secondary | ICD-10-CM | POA: Insufficient documentation

## 2015-06-04 DIAGNOSIS — Z807 Family history of other malignant neoplasms of lymphoid, hematopoietic and related tissues: Secondary | ICD-10-CM | POA: Diagnosis not present

## 2015-06-04 DIAGNOSIS — K621 Rectal polyp: Secondary | ICD-10-CM | POA: Diagnosis not present

## 2015-06-04 DIAGNOSIS — Z91012 Allergy to eggs: Secondary | ICD-10-CM | POA: Diagnosis not present

## 2015-06-04 DIAGNOSIS — F17210 Nicotine dependence, cigarettes, uncomplicated: Secondary | ICD-10-CM | POA: Insufficient documentation

## 2015-06-04 DIAGNOSIS — Z91013 Allergy to seafood: Secondary | ICD-10-CM | POA: Insufficient documentation

## 2015-06-04 DIAGNOSIS — D125 Benign neoplasm of sigmoid colon: Secondary | ICD-10-CM

## 2015-06-04 DIAGNOSIS — Z8249 Family history of ischemic heart disease and other diseases of the circulatory system: Secondary | ICD-10-CM | POA: Diagnosis not present

## 2015-06-04 DIAGNOSIS — D123 Benign neoplasm of transverse colon: Secondary | ICD-10-CM | POA: Diagnosis not present

## 2015-06-04 DIAGNOSIS — E119 Type 2 diabetes mellitus without complications: Secondary | ICD-10-CM | POA: Diagnosis not present

## 2015-06-04 DIAGNOSIS — K640 First degree hemorrhoids: Secondary | ICD-10-CM | POA: Diagnosis not present

## 2015-06-04 DIAGNOSIS — Z833 Family history of diabetes mellitus: Secondary | ICD-10-CM | POA: Insufficient documentation

## 2015-06-04 DIAGNOSIS — I10 Essential (primary) hypertension: Secondary | ICD-10-CM | POA: Insufficient documentation

## 2015-06-04 DIAGNOSIS — E7800 Pure hypercholesterolemia, unspecified: Secondary | ICD-10-CM | POA: Insufficient documentation

## 2015-06-04 HISTORY — DX: Personal history of other diseases of the circulatory system: Z86.79

## 2015-06-04 HISTORY — PX: COLONOSCOPY WITH PROPOFOL: SHX5780

## 2015-06-04 HISTORY — PX: POLYPECTOMY: SHX5525

## 2015-06-04 HISTORY — DX: Dizziness and giddiness: R42

## 2015-06-04 HISTORY — DX: Personal history of diseases of the skin and subcutaneous tissue: Z87.2

## 2015-06-04 HISTORY — DX: Type 2 diabetes mellitus without complications: E11.9

## 2015-06-04 LAB — GLUCOSE, CAPILLARY
GLUCOSE-CAPILLARY: 127 mg/dL — AB (ref 65–99)
Glucose-Capillary: 92 mg/dL (ref 65–99)

## 2015-06-04 SURGERY — COLONOSCOPY WITH PROPOFOL
Anesthesia: Monitor Anesthesia Care | Wound class: Contaminated

## 2015-06-04 MED ORDER — STERILE WATER FOR IRRIGATION IR SOLN
Status: DC | PRN
  Administered 2015-06-04: 08:00:00

## 2015-06-04 MED ORDER — PROPOFOL 10 MG/ML IV BOLUS
INTRAVENOUS | Status: DC | PRN
Start: 2015-06-04 — End: 2015-06-04
  Administered 2015-06-04 (×4): 20 mg via INTRAVENOUS
  Administered 2015-06-04: 100 mg via INTRAVENOUS
  Administered 2015-06-04: 20 mg via INTRAVENOUS
  Administered 2015-06-04: 100 mg via INTRAVENOUS

## 2015-06-04 MED ORDER — LIDOCAINE HCL (CARDIAC) 20 MG/ML IV SOLN
INTRAVENOUS | Status: DC | PRN
  Administered 2015-06-04: 20 mg via INTRAVENOUS

## 2015-06-04 MED ORDER — ACETAMINOPHEN 160 MG/5ML PO SOLN: 325.0000 mg | ORAL | Status: DC | PRN

## 2015-06-04 MED ORDER — ACETAMINOPHEN 325 MG PO TABS: 325.0000 mg | ORAL_TABLET | ORAL | Status: DC | PRN

## 2015-06-04 MED ORDER — LACTATED RINGERS IV SOLN
INTRAVENOUS | Status: DC
  Administered 2015-06-04 (×2): via INTRAVENOUS

## 2015-06-04 SURGICAL SUPPLY — 28 items

## 2015-06-04 NOTE — H&P (Signed)
[facility]  [address] [place], [place] [address] Phone: [phone] Fax : [phone]  Primary Care Physician:  Halina Maidens, MD Primary Gastroenterologist:  Dr. Allen Norris  Pre-Procedure History & Physical: HPI:  SENCERE SYMONETTE is a 55 y.o. male is here for a screening colonoscopy.   Past Medical History  Diagnosis Date  . Hypertension   . High cholesterol   . H/O hidradenitis suppurativa   . H/O varicose veins   . Diabetes mellitus without complication ([place])   . Vertigo     Past Surgical History  Procedure Laterality Date  . Hip arthroplasty Left 2013  . Varicose vein surgery Bilateral 2015  . Leg skin lesion  biopsy / excision Right 2015    Hidradenitis    Prior to Admission medications   Medication Sig Start Date End Date Taking? Authorizing Provider  allopurinol (ZYLOPRIM) 100 MG tablet Take 1 tablet (100 mg total) by mouth daily. 04/25/15  Yes Glean Hess, MD  amLODipine (NORVASC) 5 MG tablet Take 1 tablet (5 mg total) by mouth daily. 04/25/15  Yes Glean Hess, MD  glucose blood (FREESTYLE LITE) test strip FREESTYLE LITE TEST (In Vitro Strip)  1 (one) Strip Strip two times daily for 50 days  Quantity: 100;  Refills: 5   Ordered :10-Nov-2012  Alison Stalling ;  Started 06-October-2012 Active Comments: dx: 250.02 dispense covered supplies 10/06/12  Yes Historical Provider, MD  losartan-hydrochlorothiazide (HYZAAR) 100-25 MG tablet Take 1 tablet by mouth daily. 04/25/15  Yes Glean Hess, MD  Multiple Vitamin (MULTIVITAMIN) capsule Take 1 capsule by mouth daily.   Yes Historical Provider, MD  pravastatin (PRAVACHOL) 40 MG tablet Take 1 tablet (40 mg total) by mouth at bedtime. 04/25/15  Yes Glean Hess, MD  SitaGLIPtin-MetFORMIN HCl (JANUMET XR) [phone] MG TB24 Take 1 tablet by mouth daily. 05/01/15  Yes Glean Hess, MD    Allergies as of 04/27/2015 - Review Complete 04/27/2015  Allergen Reaction Noted  . Eggs or  egg-derived products  11/22/2014  . Fish allergy  04/25/2015    Family History  Problem Relation Age of Onset  . Diabetes Sister   . Diabetes Brother   . Diabetes Brother   . Hypertension Mother   . Lymphoma Father     Social History   Social History  . Marital Status: Married    Spouse Name: N/A  . Number of Children: N/A  . Years of Education: N/A   Occupational History  . Not on file.   Social History Main Topics  . Smoking status: Current Every Day Smoker -- 0.25 packs/day for 30 years    Types: Cigarettes  . Smokeless tobacco: Not on file  . Alcohol Use: No  . Drug Use: No  . Sexual Activity: Not on file   Other Topics Concern  . Not on file   Social History Narrative    Review of Systems: See HPI, otherwise negative ROS  Physical Exam: BP 128/79 mmHg  Pulse 88  Temp(Src) 97.9 F (36.6 C) (Temporal)  Resp 16  Ht '5\' 11"'$  (1.803 m)  Wt 273 lb (123.832 kg)  BMI 38.09 kg/m2  SpO2 100% General:   Alert,  pleasant and cooperative in NAD Head:  Normocephalic and atraumatic. Neck:  Supple; no masses or thyromegaly. Lungs:  Clear throughout to auscultation.    Heart:  Regular rate and rhythm. Abdomen:  Soft, nontender and nondistended. Normal bowel sounds, without guarding, and without rebound.   Neurologic:  Alert and  oriented x4;  grossly normal neurologically.  Impression/Plan: ANAY RATHE is now here to undergo a screening colonoscopy.  Risks, benefits, and alternatives regarding colonoscopy have been reviewed with the patient.  Questions have been answered.  All parties agreeable.

## 2015-06-04 NOTE — Anesthesia Procedure Notes (Signed)
Procedure Name: MAC Performed by: Lynne Righi Pre-anesthesia Checklist: Patient identified, Emergency Drugs available, Suction available, Patient being monitored and Timeout performed Patient Re-evaluated:Patient Re-evaluated prior to inductionOxygen Delivery Method: Nasal cannula       

## 2015-06-04 NOTE — Op Note (Signed)
[facility] Gastroenterology Patient Name: Jon Mendez Procedure Date: 06/04/2015 7:40 AM MRN: [id number] Account #: [id number] Date of Birth: 03/23/1960 Admit Type: Outpatient Age: 55 Room: [facility] OR ROOM 01 Gender: Male Note Status: Finalized Procedure:            Colonoscopy Indications:          Screening for colorectal malignant neoplasm Providers:            Lucilla Lame, MD Referring MD:         Halina Maidens, MD (Referring MD) Medicines:            Propofol per Anesthesia Complications:        No immediate complications. Procedure:            Pre-Anesthesia Assessment:                       - Prior to the procedure, a History and Physical was                        performed, and patient medications and allergies were                        reviewed. The patient's tolerance of previous                        anesthesia was also reviewed. The risks and benefits of                        the procedure and the sedation options and risks were                        discussed with the patient. All questions were                        answered, and informed consent was obtained. Prior                        Anticoagulants: The patient has taken no previous                        anticoagulant or antiplatelet agents. ASA Grade                        Assessment: II - A patient with mild systemic disease.                        After reviewing the risks and benefits, the patient was                        deemed in satisfactory condition to undergo the                        procedure.                       After obtaining informed consent, the colonoscope was                        passed under direct vision. Throughout the procedure,  the patient's blood pressure, pulse, and oxygen                        saturations were monitored continuously. The Olympus                        CF-HQ190L Colonoscope (S#. [id number]) was introduced                   through the anus and advanced to the the cecum,                        identified by appendiceal orifice and ileocecal valve.                        The colonoscopy was performed without difficulty. The                        patient tolerated the procedure well. The quality of                        the bowel preparation was excellent. Findings:      The perianal and digital rectal examinations were normal.      A 5 mm polyp was found in the ascending colon. The polyp was sessile.       The polyp was removed with a cold snare. Resection and retrieval were       complete.      Five sessile polyps were found in the transverse colon. The polyps were       4 to 8 mm in size. These polyps were removed with a cold snare.       Resection and retrieval were complete.      Three sessile polyps were found in the sigmoid colon. The polyps were 2       to 4 mm in size. These polyps were removed with a cold snare. Resection       and retrieval were complete.      A 8 mm polyp was found in the rectum. The polyp was pedunculated. The       polyp was removed with a cold snare. Resection and retrieval were       complete. To prevent bleeding post-intervention, one hemostatic clip was       successfully placed (MR conditional). There was no bleeding at the end       of the procedure.      Non-bleeding internal hemorrhoids were found during retroflexion. The       hemorrhoids were Grade I (internal hemorrhoids that do not prolapse). Impression:           - One 5 mm polyp in the ascending colon, removed with a                        cold snare. Resected and retrieved.                       - Five 4 to 8 mm polyps in the transverse colon,                        removed with a cold snare. Resected and retrieved.                       -  Three 2 to 4 mm polyps in the sigmoid colon, removed                        with a cold snare. Resected and retrieved.                       - One 8 mm polyp  in the rectum, removed with a cold                        snare. Resected and retrieved. Clip (MR conditional)                        was placed.                       - Non-bleeding internal hemorrhoids. Recommendation:       - Repeat colonoscopy in 5 years if polyp adenoma and 10                        years if hyperplastic Procedure Code(s):    --- Professional ---                       [phone], Colonoscopy, flexible; with removal of tumor(s),                        polyp(s), or other lesion(s) by snare technique Diagnosis Code(s):    --- Professional ---                       Z12.11, Encounter for screening for malignant neoplasm                        of colon                       D12.2, Benign neoplasm of ascending colon                       K62.1, Rectal polyp                       D12.3, Benign neoplasm of transverse colon (hepatic                        flexure or splenic flexure)                       D12.5, Benign neoplasm of sigmoid colon CPT copyright 2016 American Medical Association. All rights reserved. The codes documented in this report are preliminary and upon coder review may  be revised to meet current compliance requirements. Lucilla Lame, MD 06/04/2015 8:25:26 AM This report has been signed electronically. Number of Addenda: 0 Note Initiated On: 06/04/2015 7:40 AM Scope Withdrawal Time: 0 hours 17 minutes 4 seconds  Total Procedure Duration: 0 hours 19 minutes 32 seconds       [facility]

## 2015-06-04 NOTE — Transfer of Care (Signed)
Immediate Anesthesia Transfer of Care Note  Patient: Jon Mendez  Procedure(s) Performed: Procedure(s) with comments: COLONOSCOPY WITH PROPOFOL (N/A) - diabetic - oral meds POLYPECTOMY  Patient Location: PACU  Anesthesia Type: MAC  Level of Consciousness: awake, alert  and patient cooperative  Airway and Oxygen Therapy: Patient Spontanous Breathing and Patient connected to supplemental oxygen  Post-op Assessment: Post-op Vital signs reviewed, Patient's Cardiovascular Status Stable, Respiratory Function Stable, Patent Airway and No signs of Nausea or vomiting  Post-op Vital Signs: Reviewed and stable  Complications: No apparent anesthesia complications

## 2015-06-04 NOTE — Anesthesia Preprocedure Evaluation (Signed)
Anesthesia Evaluation  Patient identified by MRN, date of birth, ID band  Reviewed: Allergy & Precautions, H&P , NPO status , Patient's Chart, lab work & pertinent test results  Airway Mallampati: III  TM Distance: >3 FB Neck ROM: full    Dental  (+) Poor Dentition   Pulmonary Current Smoker,    Pulmonary exam normal        Cardiovascular hypertension,  Rhythm:regular Rate:Normal     Neuro/Psych    GI/Hepatic   Endo/Other  diabetes  Renal/GU      Musculoskeletal   Abdominal   Peds  Hematology   Anesthesia Other Findings   Reproductive/Obstetrics                             Anesthesia Physical Anesthesia Plan  ASA: II  Anesthesia Plan: MAC   Post-op Pain Management:    Induction:   Airway Management Planned:   Additional Equipment:   Intra-op Plan:   Post-operative Plan:   Informed Consent: I have reviewed the patients History and Physical, chart, labs and discussed the procedure including the risks, benefits and alternatives for the proposed anesthesia with the patient or authorized representative who has indicated his/her understanding and acceptance.     Plan Discussed with: CRNA  Anesthesia Plan Comments:         Anesthesia Quick Evaluation

## 2015-06-04 NOTE — Anesthesia Postprocedure Evaluation (Signed)
Anesthesia Post Note  Patient: Jon Mendez  Procedure(s) Performed: Procedure(s) (LRB): COLONOSCOPY WITH PROPOFOL (N/A) POLYPECTOMY  Patient location during evaluation: PACU Anesthesia Type: MAC Level of consciousness: awake and alert and oriented Pain management: satisfactory to patient Vital Signs Assessment: post-procedure vital signs reviewed and stable Respiratory status: spontaneous breathing, nonlabored ventilation and respiratory function stable Cardiovascular status: blood pressure returned to baseline and stable Postop Assessment: Adequate PO intake and No signs of nausea or vomiting Anesthetic complications: no    Raliegh Ip

## 2015-06-05 ENCOUNTER — Encounter: Payer: Self-pay | Admitting: Gastroenterology

## 2015-06-06 ENCOUNTER — Encounter: Payer: Self-pay | Admitting: Gastroenterology

## 2015-08-23 ENCOUNTER — Ambulatory Visit: Payer: Managed Care, Other (non HMO) | Admitting: Internal Medicine

## 2015-09-19 ENCOUNTER — Encounter: Payer: Self-pay | Admitting: Internal Medicine

## 2015-09-19 ENCOUNTER — Ambulatory Visit (INDEPENDENT_AMBULATORY_CARE_PROVIDER_SITE_OTHER): Payer: Managed Care, Other (non HMO) | Admitting: Internal Medicine

## 2015-09-19 VITALS — BP 138/82 | HR 72 | Resp 16 | Ht 71.0 in | Wt 290.0 lb

## 2015-09-19 DIAGNOSIS — Z720 Tobacco use: Secondary | ICD-10-CM

## 2015-09-19 DIAGNOSIS — M1009 Idiopathic gout, multiple sites: Secondary | ICD-10-CM | POA: Diagnosis not present

## 2015-09-19 DIAGNOSIS — M1A9XX0 Chronic gout, unspecified, without tophus (tophi): Secondary | ICD-10-CM

## 2015-09-19 DIAGNOSIS — N183 Chronic kidney disease, stage 3 unspecified: Secondary | ICD-10-CM

## 2015-09-19 DIAGNOSIS — F17200 Nicotine dependence, unspecified, uncomplicated: Secondary | ICD-10-CM

## 2015-09-19 DIAGNOSIS — I10 Essential (primary) hypertension: Secondary | ICD-10-CM

## 2015-09-19 DIAGNOSIS — E1122 Type 2 diabetes mellitus with diabetic chronic kidney disease: Secondary | ICD-10-CM

## 2015-09-19 DIAGNOSIS — E118 Type 2 diabetes mellitus with unspecified complications: Secondary | ICD-10-CM | POA: Insufficient documentation

## 2015-09-19 DIAGNOSIS — M1A00X0 Idiopathic chronic gout, unspecified site, without tophus (tophi): Secondary | ICD-10-CM

## 2015-09-19 NOTE — Progress Notes (Signed)
Date:  09/19/2015   Name:  Jon Mendez   DOB:  03/23/1960   MRN:  [id number]   Chief Complaint: Hypertension and Diabetes Hypertension This is a chronic problem. The current episode started more than 1 year ago. The problem is unchanged. The problem is controlled. Pertinent negatives include no chest pain, headaches, palpitations or shortness of breath. Identifiable causes of hypertension include chronic renal disease.  Diabetes He presents for his follow-up diabetic visit. He has type 2 diabetes mellitus. His disease course has been fluctuating. Pertinent negatives for hypoglycemia include no headaches or tremors. Pertinent negatives for diabetes include no chest pain, no fatigue, no polydipsia and no polyuria. Current diabetic treatment includes oral agent (dual therapy). He is following a generally healthy diet. He monitors blood glucose at home 1-2 x per week. His breakfast blood glucose range is generally 90-110 mg/dl. An ACE inhibitor/angiotensin II receptor blocker is being taken.  Gout - now on allopurinol low dose for prevention. No recent flares.   Lab Results  Component Value Date   HGBA1C 6.4* 04/25/2015   Lab Results  Component Value Date   CREATININE 1.57* 04/25/2015     Review of Systems  Constitutional: Negative for appetite change, fatigue and unexpected weight change.  Eyes: Negative for visual disturbance.  Respiratory: Negative for cough, shortness of breath and wheezing.   Cardiovascular: Negative for chest pain, palpitations and leg swelling.  Gastrointestinal: Negative for abdominal pain and blood in stool.  Endocrine: Negative for polydipsia and polyuria.  Genitourinary: Negative for dysuria and hematuria.  Skin: Negative for color change and rash.  Neurological: Negative for tremors, numbness and headaches.  Psychiatric/Behavioral: Negative for dysphoric mood.    Patient Active Problem List   Diagnosis Date Noted  . Special screening for  malignant neoplasms, colon   . Benign neoplasm of ascending colon   . Rectal polyp   . Benign neoplasm of transverse colon   . Benign neoplasm of sigmoid colon   . Uncontrolled type 2 diabetes mellitus with diabetic nephropathy, without long-term current use of insulin ([place]) 04/25/2015  . Chronic renal insufficiency, stage III (moderate) 04/25/2015  . Chronic gouty arthritis 11/22/2014  . Dyslipidemia 11/22/2014  . Essential (primary) hypertension 11/22/2014  . Compulsive tobacco user syndrome 11/22/2014  . Leg varicosity w ulcer ([place]) 11/22/2014  . Acne inversa 09/06/2012  . Benign paroxysmal positional nystagmus 07/29/2004    Prior to Admission medications   Medication Sig Start Date End Date Taking? Authorizing Provider  allopurinol (ZYLOPRIM) 100 MG tablet Take 1 tablet (100 mg total) by mouth daily. 04/25/15  Yes Glean Hess, MD  amLODipine (NORVASC) 5 MG tablet Take 1 tablet (5 mg total) by mouth daily. 04/25/15  Yes Glean Hess, MD  glucose blood (FREESTYLE LITE) test strip FREESTYLE LITE TEST (In Vitro Strip)  1 (one) Strip Strip two times daily for 50 days  Quantity: 100;  Refills: 5   Ordered :10-Nov-2012  Alison Stalling ;  Started 06-October-2012 Active Comments: dx: 250.02 dispense covered supplies 10/06/12  Yes Historical Provider, MD  losartan-hydrochlorothiazide (HYZAAR) 100-25 MG tablet Take 1 tablet by mouth daily. 04/25/15  Yes Glean Hess, MD  Multiple Vitamin (MULTIVITAMIN) capsule Take 1 capsule by mouth daily.   Yes Historical Provider, MD  pravastatin (PRAVACHOL) 40 MG tablet Take 1 tablet (40 mg total) by mouth at bedtime. 04/25/15  Yes Glean Hess, MD  SitaGLIPtin-MetFORMIN HCl (JANUMET XR) [phone] MG TB24 Take 1 tablet by mouth daily.  05/01/15  Yes Glean Hess, MD    Allergies  Allergen Reactions  . Eggs Or Egg-Derived Products Shortness Of Breath    Throat swells  . Fish Allergy Shortness Of Breath    Throat swells    Past Surgical  History  Procedure Laterality Date  . Hip arthroplasty Left 2013  . Varicose vein surgery Bilateral 2015  . Leg skin lesion  biopsy / excision Right 2015    Hidradenitis  . Colonoscopy with propofol N/A 06/04/2015    Procedure: COLONOSCOPY WITH PROPOFOL;  Surgeon: Lucilla Lame, MD;  Location: [facility];  Service: Endoscopy;  Laterality: N/A;  diabetic - oral meds  . Polypectomy  06/04/2015    Procedure: POLYPECTOMY;  Surgeon: Lucilla Lame, MD;  Location: [facility];  Service: Endoscopy;;    Social History  Substance Use Topics  . Smoking status: Current Every Day Smoker -- 0.25 packs/day for 30 years    Types: Cigarettes  . Smokeless tobacco: None  . Alcohol Use: No    Medication list has been reviewed and updated.   Physical Exam  Constitutional: He is oriented to person, place, and time. He appears well-developed. No distress.  HENT:  Head: Normocephalic and atraumatic.  Neck: Normal range of motion. Carotid bruit is not present.  Cardiovascular: Normal rate, regular rhythm and normal heart sounds.   Pulmonary/Chest: Effort normal and breath sounds normal. No respiratory distress.  Musculoskeletal: Normal range of motion.  Neurological: He is alert and oriented to person, place, and time.  Skin: Skin is warm and dry. No rash noted.  Psychiatric: He has a normal mood and affect. His behavior is normal. Thought content normal.  Nursing note and vitals reviewed.   BP 138/82 mmHg  Pulse 72  Resp 16  Ht '5\' 11"'$  (1.803 m)  Wt 290 lb (131.543 kg)  BMI 40.46 kg/m2  SpO2 98%  Assessment and Plan: 1. Essential (primary) hypertension Controlled; check renal function - Basic metabolic panel  2. Uncontrolled type 2 diabetes mellitus with diabetic nephropathy, without long-term current use of insulin ([place]) Much improved last visit - continue current therapy - Hemoglobin A1c  3. Chronic gouty arthritis No recurrence; continue low dose allopurinol  4.  Smoker - Nurse to provide smoking / tobacco cessation education   Halina Maidens, MD [facility]  09/19/2015

## 2015-09-19 NOTE — Patient Instructions (Signed)
Pneumococcal Polysaccharide Vaccine: What You Need to Know  1. Why get vaccinated?  Vaccination can protect older adults (and some children and younger adults) from pneumococcal disease.  Pneumococcal disease is caused by bacteria that can spread from person to person through close contact. It can cause ear infections, and it can also lead to more serious infections of the:   · Lungs (pneumonia),  · Blood (bacteremia), and  · Covering of the brain and spinal cord (meningitis). Meningitis can cause deafness and brain damage, and it can be fatal.  Anyone can get pneumococcal disease, but children under 2 years of age, people with certain medical conditions, adults over 65 years of age, and cigarette smokers are at the highest risk.  About 18,000 older adults die each year from pneumococcal disease in the [place].  Treatment of pneumococcal infections with penicillin and other drugs used to be more effective. But some strains of the disease have become resistant to these drugs. This makes prevention of the disease, through vaccination, even more important.  2. Pneumococcal polysaccharide vaccine (PPSV23)  Pneumococcal polysaccharide vaccine (PPSV23) protects against 23 types of pneumococcal bacteria. It will not prevent all pneumococcal disease.  PPSV23 is recommended for:  · All adults 65 years of age and older,  · Anyone 2 through 55 years of age with certain long-term health problems,  · Anyone 2 through 55 years of age with a weakened immune system,  · Adults 19 through 55 years of age who smoke cigarettes or have asthma.  Most people need only one dose of PPSV. A second dose is recommended for certain high-risk groups. People 65 and older should get a dose even if they have gotten one or more doses of the vaccine before they turned 65.  Your healthcare provider can give you more information about these recommendations.  Most healthy adults develop protection within 2 to 3 weeks of getting the shot.  3. Some  people should not get this vaccine  · Anyone who has had a life-threatening allergic reaction to PPSV should not get another dose.  · Anyone who has a severe allergy to any component of PPSV should not receive it. Tell your provider if you have any severe allergies.  · Anyone who is moderately or severely ill when the shot is scheduled may be asked to wait until they recover before getting the vaccine. Someone with a mild illness can usually be vaccinated.  · Children less than 2 years of age should not receive this vaccine.  · There is no evidence that PPSV is harmful to either a pregnant woman or to her fetus. However, as a precaution, women who need the vaccine should be vaccinated before becoming pregnant, if possible.  4. Risks of a vaccine reaction  With any medicine, including vaccines, there is a chance of side effects. These are usually mild and go away on their own, but serious reactions are also possible.  About half of people who get PPSV have mild side effects, such as redness or pain where the shot is given, which go away within about two days.  Less than 1 out of 100 people develop a fever, muscle aches, or more severe local reactions.  Problems that could happen after any vaccine:  · People sometimes faint after a medical procedure, including vaccination. Sitting or lying down for about 15 minutes can help prevent fainting, and injuries caused by a fall. Tell your doctor if you feel dizzy, or have vision changes or   ringing in the ears.  · Some people get severe pain in the shoulder and have difficulty moving the arm where a shot was given. This happens very rarely.  · Any medication can cause a severe allergic reaction. Such reactions from a vaccine are very rare, estimated at about 1 in a million doses, and would happen within a few minutes to a few hours after the vaccination.  As with any medicine, there is a very remote chance of a vaccine causing a serious injury or death.  The safety of  vaccines is always being monitored. For more information, visit: www.cdc.gov/vaccinesafety/  5. What if there is a serious reaction?  What should I look for?  Look for anything that concerns you, such as signs of a severe allergic reaction, very high fever, or unusual behavior.   Signs of a severe allergic reaction can include hives, swelling of the face and throat, difficulty breathing, a fast heartbeat, dizziness, and weakness. These would usually start a few minutes to a few hours after the vaccination.  What should I do?  If you think it is a severe allergic reaction or other emergency that can't wait, call 9-1-1 or get to the nearest hospital. Otherwise, call your doctor.  Afterward, the reaction should be reported to the Vaccine Adverse Event Reporting System (VAERS). Your doctor might file this report, or you can do it yourself through the VAERS web site at www.vaers.hhs.gov, or by calling 1-800-822-7967.   VAERS does not give medical advice.  6. How can I learn more?  · Ask your doctor. He or she can give you the vaccine package insert or suggest other sources of information.  · Call your local or state health department.  · Contact the Centers for Disease Control and Prevention (CDC):    Call 1-800-232-4636 (1-800-CDC-INFO) or    Visit CDC's website at www.cdc.gov/vaccines  CDC Pneumococcal Polysaccharide Vaccine VIS (07/29/13)     This information is not intended to replace advice given to you by your health care provider. Make sure you discuss any questions you have with your health care provider.     Document Released: 01/19/2006 Document Revised: 04/14/2014 Document Reviewed: 08/01/2013  Elsevier Interactive Patient Education ©2016 [employer] Inc[employer]

## 2015-09-20 LAB — HEMOGLOBIN A1C
Est. average glucose Bld gHb Est-mCnc: 137 mg/dL
HEMOGLOBIN A1C: 6.4 % — AB (ref 4.8–5.6)

## 2015-09-20 LAB — BASIC METABOLIC PANEL
BUN / CREAT RATIO: 13 (ref 9–20)
BUN: 18 mg/dL (ref 6–24)
CHLORIDE: 97 mmol/L (ref 96–106)
CO2: 23 mmol/L (ref 18–29)
Calcium: 9.6 mg/dL (ref 8.7–10.2)
Creatinine, Ser: 1.37 mg/dL — ABNORMAL HIGH (ref 0.76–1.27)
GFR calc non Af Amer: 58 mL/min/{1.73_m2} — ABNORMAL LOW (ref >59)
GFR, EST AFRICAN AMERICAN: 67 mL/min/{1.73_m2} (ref >59)
GLUCOSE: 90 mg/dL (ref 65–99)
POTASSIUM: 5.1 mmol/L (ref 3.5–5.2)
SODIUM: 137 mmol/L (ref 134–144)

## 2015-10-04 ENCOUNTER — Other Ambulatory Visit: Payer: Self-pay | Admitting: Internal Medicine

## 2015-10-04 ENCOUNTER — Telehealth: Payer: Self-pay

## 2015-10-30 NOTE — Telephone Encounter (Signed)
ERROR

## 2016-04-28 ENCOUNTER — Encounter: Payer: Self-pay | Admitting: Internal Medicine

## 2016-04-28 ENCOUNTER — Other Ambulatory Visit: Payer: Self-pay | Admitting: Internal Medicine

## 2016-04-28 ENCOUNTER — Ambulatory Visit (INDEPENDENT_AMBULATORY_CARE_PROVIDER_SITE_OTHER): Payer: Commercial Managed Care - PPO | Admitting: Internal Medicine

## 2016-04-28 VITALS — BP 122/80 | HR 78 | Temp 97.0°F | Ht 71.0 in | Wt 294.0 lb

## 2016-04-28 DIAGNOSIS — E1122 Type 2 diabetes mellitus with diabetic chronic kidney disease: Secondary | ICD-10-CM

## 2016-04-28 DIAGNOSIS — Z0000 Encounter for general adult medical examination without abnormal findings: Secondary | ICD-10-CM | POA: Diagnosis not present

## 2016-04-28 DIAGNOSIS — Z23 Encounter for immunization: Secondary | ICD-10-CM | POA: Diagnosis not present

## 2016-04-28 DIAGNOSIS — N183 Chronic kidney disease, stage 3 unspecified: Secondary | ICD-10-CM

## 2016-04-28 DIAGNOSIS — E785 Hyperlipidemia, unspecified: Secondary | ICD-10-CM

## 2016-04-28 DIAGNOSIS — I10 Essential (primary) hypertension: Secondary | ICD-10-CM | POA: Diagnosis not present

## 2016-04-28 DIAGNOSIS — Z125 Encounter for screening for malignant neoplasm of prostate: Secondary | ICD-10-CM | POA: Diagnosis not present

## 2016-04-28 LAB — POCT URINALYSIS DIPSTICK
Bilirubin, UA: NEGATIVE
KETONES UA: NEGATIVE
Leukocytes, UA: NEGATIVE
Nitrite, UA: NEGATIVE
PH UA: 6.5
PROTEIN UA: NEGATIVE
SPEC GRAV UA: 1.015
Urobilinogen, UA: 0.2

## 2016-04-28 NOTE — Patient Instructions (Addendum)
Get annual eye examPneumococcal Polysaccharide Vaccine: What You Need to Know 1. Why get vaccinated? Vaccination can protect older adults (and some children and younger adults) from pneumococcal disease. Pneumococcal disease is caused by bacteria that can spread from person to person through close contact. It can cause ear infections, and it can also lead to more serious infections of the:  Lungs (pneumonia),  Blood (bacteremia), and  Covering of the brain and spinal cord (meningitis). Meningitis can cause deafness and brain damage, and it can be fatal. Anyone can get pneumococcal disease, but children under 25 years of age, people with certain medical conditions, adults over 74 years of age, and cigarette smokers are at the highest risk. About 18,000 older adults die each year from pneumococcal disease in the [place]. Treatment of pneumococcal infections with penicillin and other drugs used to be more effective. But some strains of the disease have become resistant to these drugs. This makes prevention of the disease, through vaccination, even more important. 2. Pneumococcal polysaccharide vaccine (PPSV23) Pneumococcal polysaccharide vaccine (PPSV23) protects against 23 types of pneumococcal bacteria. It will not prevent all pneumococcal disease. PPSV23 is recommended for:  All adults 46 years of age and older,  Anyone 2 through 56 years of age with certain long-term health problems,  Anyone 2 through 55 years of age with a weakened immune system,  Adults 46 through 56 years of age who smoke cigarettes or have asthma. Most people need only one dose of PPSV. A second dose is recommended for certain high-risk groups. People 70 and older should get a dose even if they have gotten one or more doses of the vaccine before they turned 65. Your healthcare provider can give you more information about these recommendations. Most healthy adults develop protection within 2 to 3 weeks of getting  the shot. 3. Some people should not get this vaccine  Anyone who has had a life-threatening allergic reaction to PPSV should not get another dose.  Anyone who has a severe allergy to any component of PPSV should not receive it. Tell your provider if you have any severe allergies.  Anyone who is moderately or severely ill when the shot is scheduled may be asked to wait until they recover before getting the vaccine. Someone with a mild illness can usually be vaccinated.  Children less than 28 years of age should not receive this vaccine.  There is no evidence that PPSV is harmful to either a pregnant woman or to her fetus. However, as a precaution, women who need the vaccine should be vaccinated before becoming pregnant, if possible. 4. Risks of a vaccine reaction With any medicine, including vaccines, there is a chance of side effects. These are usually mild and go away on their own, but serious reactions are also possible. About half of people who get PPSV have mild side effects, such as redness or pain where the shot is given, which go away within about two days. Less than 1 out of 100 people develop a fever, muscle aches, or more severe local reactions. Problems that could happen after any vaccine:  People sometimes faint after a medical procedure, including vaccination. Sitting or lying down for about 15 minutes can help prevent fainting, and injuries caused by a fall. Tell your doctor if you feel dizzy, or have vision changes or ringing in the ears.  Some people get severe pain in the shoulder and have difficulty moving the arm where a shot was given. This happens very rarely.  Any medication can cause a severe allergic reaction. Such reactions from a vaccine are very rare, estimated at about 1 in a million doses, and would happen within a few minutes to a few hours after the vaccination. As with any medicine, there is a very remote chance of a vaccine causing a serious injury or  death. The safety of vaccines is always being monitored. For more information, visit: http://www.aguilar.org/ 5. What if there is a serious reaction? What should I look for? Look for anything that concerns you, such as signs of a severe allergic reaction, very high fever, or unusual behavior. Signs of a severe allergic reaction can include hives, swelling of the face and throat, difficulty breathing, a fast heartbeat, dizziness, and weakness. These would usually start a few minutes to a few hours after the vaccination. What should I do? If you think it is a severe allergic reaction or other emergency that can't wait, call 9-1-1 or get to the nearest hospital. Otherwise, call your doctor. Afterward, the reaction should be reported to the Vaccine Adverse Event Reporting System (VAERS). Your doctor might file this report, or you can do it yourself through the VAERS web site at www.vaers.SamedayNews.es, or by calling 630 118 0825. VAERS does not give medical advice. 6. How can I learn more?  Ask your doctor. He or she can give you the vaccine package insert or suggest other sources of information.  Call your local or state health department.  Contact the Centers for Disease Control and Prevention (CDC):  Call 4790703138 (1-800-CDC-INFO) or  Visit CDC's website at http://hunter.com/ CDC Pneumococcal Polysaccharide Vaccine VIS (07/29/13) This information is not intended to replace advice given to you by your health care provider. Make sure you discuss any questions you have with your health care provider. Document Released: 01/19/2006 Document Revised: 12/13/2015 Document Reviewed: 12/13/2015 Elsevier Interactive Patient Education  2017 [employer].

## 2016-04-28 NOTE — Progress Notes (Signed)
Date:  04/28/2016   Name:  Jon Mendez   DOB:  03/23/1960   MRN:  [id number]   Chief Complaint: Annual Exam Jon Mendez is a 56 y.o. male who presents today for his Complete Annual Exam. He feels well. He reports exercising exertion at work. He reports he is sleeping well.   Diabetes  He presents for his follow-up diabetic visit. He has type 2 diabetes mellitus. His disease course has been stable. Pertinent negatives for hypoglycemia include no dizziness or headaches. Pertinent negatives for diabetes include no chest pain, no fatigue and no foot paresthesias. Current diabetic treatment includes oral agent (dual therapy). He is compliant with treatment most of the time. He monitors blood glucose at home 1-2 x per week. There is no change in his home blood glucose trend. His breakfast blood glucose is taken between 6-7 am. His breakfast blood glucose range is generally 110-130 mg/dl. An ACE inhibitor/angiotensin II receptor blocker is being taken.  Hypertension  This is a chronic problem. The problem is controlled. Pertinent negatives include no chest pain, headaches, palpitations or shortness of breath. Past treatments include angiotensin blockers, calcium channel blockers and diuretics. The current treatment provides significant improvement.  Hyperlipidemia  This is a chronic problem. Pertinent negatives include no chest pain, myalgias or shortness of breath. Current antihyperlipidemic treatment includes statins.    Lab Results  Component Value Date   HGBA1C 6.4 (H) 09/19/2015   Lab Results  Component Value Date   CHOL 165 04/25/2015   HDL 40 04/25/2015   LDLCALC 104 (H) 04/25/2015   TRIG 107 04/25/2015   CHOLHDL 4.1 04/25/2015     Review of Systems  Constitutional: Negative for appetite change, chills, diaphoresis, fatigue and unexpected weight change.  HENT: Negative for hearing loss, tinnitus, trouble swallowing and voice change.   Eyes: Negative for visual  disturbance.  Respiratory: Negative for choking, shortness of breath and wheezing.   Cardiovascular: Negative for chest pain, palpitations and leg swelling.  Gastrointestinal: Negative for abdominal pain, blood in stool, constipation and diarrhea.  Genitourinary: Negative for difficulty urinating, dysuria and frequency.  Musculoskeletal: Positive for arthralgias (hip pain intermittent). Negative for back pain and myalgias.  Skin: Negative for color change and rash.  Neurological: Negative for dizziness, syncope and headaches.  Hematological: Negative for adenopathy.  Psychiatric/Behavioral: Negative for dysphoric mood and sleep disturbance.    Patient Active Problem List   Diagnosis Date Noted  . Controlled type 2 diabetes mellitus with stage 3 chronic kidney disease, without long-term current use of insulin ([place]) 09/19/2015  . Special screening for malignant neoplasms, colon   . Benign neoplasm of ascending colon   . Rectal polyp   . Benign neoplasm of transverse colon   . Benign neoplasm of sigmoid colon   . Chronic renal insufficiency, stage III (moderate) 04/25/2015  . Chronic gouty arthritis 11/22/2014  . Dyslipidemia 11/22/2014  . Essential (primary) hypertension 11/22/2014  . Compulsive tobacco user syndrome 11/22/2014  . Leg varicosity w ulcer ([place]) 11/22/2014  . Acne inversa 09/06/2012  . Benign paroxysmal positional nystagmus 07/29/2004    Prior to Admission medications   Medication Sig Start Date End Date Taking? Authorizing Provider  allopurinol (ZYLOPRIM) 100 MG tablet Take 1 tablet (100 mg total) by mouth daily. 04/25/15  Yes Glean Hess, MD  amLODipine (NORVASC) 5 MG tablet Take 1 tablet (5 mg total) by mouth daily. 04/25/15  Yes Glean Hess, MD  glucose blood (FREESTYLE LITE) test strip  FREESTYLE LITE TEST (In Vitro Strip)  1 (one) Strip Strip two times daily for 50 days  Quantity: 100;  Refills: 5   Ordered :10-Nov-2012  Alison Stalling ;  Started  06-October-2012 Active Comments: dx: 250.02 dispense covered supplies 10/06/12  Yes Historical Provider, MD  losartan-hydrochlorothiazide (HYZAAR) 100-25 MG tablet Take 1 tablet by mouth daily. 04/25/15  Yes Glean Hess, MD  Multiple Vitamin (MULTIVITAMIN) capsule Take 1 capsule by mouth daily.   Yes Historical Provider, MD  pravastatin (PRAVACHOL) 40 MG tablet Take 1 tablet (40 mg total) by mouth at bedtime. 04/25/15  Yes Glean Hess, MD  SitaGLIPtin-MetFORMIN HCl (JANUMET XR) [phone] MG TB24 Take 1 tablet by mouth daily. 05/01/15  Yes Glean Hess, MD    Allergies  Allergen Reactions  . Eggs Or Egg-Derived Products Shortness Of Breath    Throat swells  . Fish Allergy Shortness Of Breath    Throat swells    Past Surgical History:  Procedure Laterality Date  . COLONOSCOPY WITH PROPOFOL N/A 06/04/2015   Procedure: COLONOSCOPY WITH PROPOFOL;  Surgeon: Lucilla Lame, MD;  Location: [facility];  Service: Endoscopy;  Laterality: N/A;  diabetic - oral meds  . HIP ARTHROPLASTY Left 2013  . LEG SKIN LESION  BIOPSY / EXCISION Right 2015   Hidradenitis  . POLYPECTOMY  06/04/2015   Procedure: POLYPECTOMY;  Surgeon: Lucilla Lame, MD;  Location: [facility];  Service: Endoscopy;;  . VARICOSE VEIN SURGERY Bilateral 2015    Social History  Substance Use Topics  . Smoking status: Current Every Day Smoker    Packs/day: 0.25    Years: 30.00    Types: Cigarettes  . Smokeless tobacco: Current User  . Alcohol use No     Medication list has been reviewed and updated.   Physical Exam  Constitutional: He is oriented to person, place, and time. He appears well-developed and well-nourished.  HENT:  Head: Normocephalic.  Right Ear: Tympanic membrane, external ear and ear canal normal.  Left Ear: Tympanic membrane, external ear and ear canal normal.  Nose: Nose normal.  Mouth/Throat: Uvula is midline and oropharynx is clear and moist.  Cerumen impaction bilaterally  Eyes:  Conjunctivae and EOM are normal. Pupils are equal, round, and reactive to light.  Neck: Normal range of motion. Neck supple. Carotid bruit is not present. No thyromegaly present.  Cardiovascular: Normal rate, regular rhythm, normal heart sounds and intact distal pulses.   Pulmonary/Chest: Effort normal and breath sounds normal. He has no wheezes. Right breast exhibits no mass. Left breast exhibits no mass.  Abdominal: Soft. Normal appearance and bowel sounds are normal. There is no hepatosplenomegaly. There is no tenderness.  Musculoskeletal: Normal range of motion.  Lymphadenopathy:    He has no cervical adenopathy.  Neurological: He is alert and oriented to person, place, and time. He has normal reflexes.  Skin: Skin is warm, dry and intact.  Psychiatric: He has a normal mood and affect. His speech is normal and behavior is normal. Judgment and thought content normal.  Nursing note and vitals reviewed.   BP 122/80   Pulse 78   Temp 97 F (36.1 C)   Ht '5\' 11"'$  (1.803 m)   Wt 294 lb (133.4 kg)   SpO2 98%   BMI 41.00 kg/m   Assessment and Plan: 1. Annual physical exam Normal exam except for weight - discussed cutting back See ENT for cerumen removal Handicapped parking placard application given  2. Essential (primary) hypertension controlled -  CBC with Differential/Platelet - TSH - POCT urinalysis dipstick  3. Controlled type 2 diabetes mellitus with stage 3 chronic kidney disease, without long-term current use of insulin (HCC) controlled - Comprehensive metabolic panel - Hemoglobin A1c - Microalbumin / creatinine urine ratio  4. Dyslipidemia On statin therapy - Lipid panel  5. Prostate cancer screening PSA  6. Need for pneumococcal vaccination - Pneumococcal polysaccharide vaccine 23-valent greater than or equal to 2yo subcutaneous/IM   Halina Maidens, MD [facility]  04/28/2016

## 2016-04-29 ENCOUNTER — Other Ambulatory Visit: Payer: Self-pay | Admitting: Internal Medicine

## 2016-04-29 DIAGNOSIS — D72829 Elevated white blood cell count, unspecified: Secondary | ICD-10-CM | POA: Insufficient documentation

## 2016-04-29 LAB — CBC WITH DIFFERENTIAL/PLATELET
BASOS ABS: 0 10*3/uL (ref 0.0–0.2)
Basos: 0 %
EOS (ABSOLUTE): 0.4 10*3/uL (ref 0.0–0.4)
Eos: 4 %
HEMATOCRIT: 42.5 % (ref 37.5–51.0)
HEMOGLOBIN: 14 g/dL (ref 13.0–17.7)
IMMATURE GRANS (ABS): 0 10*3/uL (ref 0.0–0.1)
Immature Granulocytes: 0 %
LYMPHS ABS: 2.6 10*3/uL (ref 0.7–3.1)
LYMPHS: 21 %
MCH: 26.1 pg — AB (ref 26.6–33.0)
MCHC: 32.9 g/dL (ref 31.5–35.7)
MCV: 79 fL (ref 79–97)
MONOCYTES: 8 %
Monocytes Absolute: 0.9 10*3/uL (ref 0.1–0.9)
NEUTROS ABS: 8.6 10*3/uL — AB (ref 1.4–7.0)
Neutrophils: 67 %
Platelets: 280 10*3/uL (ref 150–379)
RBC: 5.37 x10E6/uL (ref 4.14–5.80)
RDW: 16.1 % — ABNORMAL HIGH (ref 12.3–15.4)
WBC: 12.6 10*3/uL — ABNORMAL HIGH (ref 3.4–10.8)

## 2016-04-29 LAB — LIPID PANEL
CHOL/HDL RATIO: 4.3 ratio (ref 0.0–5.0)
CHOLESTEROL TOTAL: 176 mg/dL (ref 100–199)
HDL: 41 mg/dL (ref >39)
LDL CALC: 106 mg/dL — AB (ref 0–99)
Triglycerides: 146 mg/dL (ref 0–149)
VLDL CHOLESTEROL CAL: 29 mg/dL (ref 5–40)

## 2016-04-29 LAB — COMPREHENSIVE METABOLIC PANEL
A/G RATIO: 1.2 (ref 1.2–2.2)
ALBUMIN: 4.3 g/dL (ref 3.5–5.5)
ALK PHOS: 102 IU/L (ref 39–117)
ALT: 16 IU/L (ref 0–44)
AST: 16 IU/L (ref 0–40)
BILIRUBIN TOTAL: 0.4 mg/dL (ref 0.0–1.2)
BUN / CREAT RATIO: 17 (ref 9–20)
BUN: 21 mg/dL (ref 6–24)
CHLORIDE: 97 mmol/L (ref 96–106)
CO2: 21 mmol/L (ref 18–29)
Calcium: 9.7 mg/dL (ref 8.7–10.2)
Creatinine, Ser: 1.27 mg/dL (ref 0.76–1.27)
GFR calc non Af Amer: 63 mL/min/{1.73_m2} (ref >59)
GFR, EST AFRICAN AMERICAN: 73 mL/min/{1.73_m2} (ref >59)
GLOBULIN, TOTAL: 3.5 g/dL (ref 1.5–4.5)
Glucose: 102 mg/dL — ABNORMAL HIGH (ref 65–99)
POTASSIUM: 4.4 mmol/L (ref 3.5–5.2)
SODIUM: 137 mmol/L (ref 134–144)
TOTAL PROTEIN: 7.8 g/dL (ref 6.0–8.5)

## 2016-04-29 LAB — HEMOGLOBIN A1C
Est. average glucose Bld gHb Est-mCnc: 140 mg/dL
HEMOGLOBIN A1C: 6.5 % — AB (ref 4.8–5.6)

## 2016-04-29 LAB — PSA: PROSTATE SPECIFIC AG, SERUM: 1 ng/mL (ref 0.0–4.0)

## 2016-04-29 LAB — TSH: TSH: 2.02 u[IU]/mL (ref 0.450–4.500)

## 2016-05-01 LAB — MICROALBUMIN / CREATININE URINE RATIO
CREATININE, UR: 152.3 mg/dL
MICROALBUM., U, RANDOM: 62 ug/mL
Microalb/Creat Ratio: 40.7 mg/g creat — ABNORMAL HIGH (ref 0.0–30.0)

## 2016-05-02 ENCOUNTER — Other Ambulatory Visit: Payer: Self-pay | Admitting: Internal Medicine

## 2016-05-02 MED ORDER — AMOXICILLIN 875 MG PO TABS: 875.0000 mg | ORAL_TABLET | Freq: Two times a day (BID) | ORAL | 0 refills | Status: DC

## 2016-05-12 ENCOUNTER — Inpatient Hospital Stay: Payer: Commercial Managed Care - PPO

## 2016-05-12 ENCOUNTER — Other Ambulatory Visit: Payer: Self-pay

## 2016-05-12 ENCOUNTER — Telehealth: Payer: Self-pay | Admitting: *Deleted

## 2016-05-12 ENCOUNTER — Encounter: Payer: Self-pay | Admitting: Oncology

## 2016-05-12 ENCOUNTER — Inpatient Hospital Stay: Payer: Commercial Managed Care - PPO | Attending: Oncology | Admitting: Oncology

## 2016-05-12 VITALS — BP 153/92 | HR 89 | Temp 99.0°F | Resp 18 | Ht 71.0 in | Wt 296.3 lb

## 2016-05-12 DIAGNOSIS — N183 Chronic kidney disease, stage 3 (moderate): Secondary | ICD-10-CM | POA: Diagnosis not present

## 2016-05-12 DIAGNOSIS — E1122 Type 2 diabetes mellitus with diabetic chronic kidney disease: Secondary | ICD-10-CM | POA: Diagnosis not present

## 2016-05-12 DIAGNOSIS — F17210 Nicotine dependence, cigarettes, uncomplicated: Secondary | ICD-10-CM | POA: Diagnosis not present

## 2016-05-12 DIAGNOSIS — D72829 Elevated white blood cell count, unspecified: Secondary | ICD-10-CM

## 2016-05-12 DIAGNOSIS — Z7984 Long term (current) use of oral hypoglycemic drugs: Secondary | ICD-10-CM | POA: Diagnosis not present

## 2016-05-12 DIAGNOSIS — M25559 Pain in unspecified hip: Secondary | ICD-10-CM

## 2016-05-12 DIAGNOSIS — Z79899 Other long term (current) drug therapy: Secondary | ICD-10-CM

## 2016-05-12 DIAGNOSIS — E1140 Type 2 diabetes mellitus with diabetic neuropathy, unspecified: Secondary | ICD-10-CM | POA: Insufficient documentation

## 2016-05-12 DIAGNOSIS — E7800 Pure hypercholesterolemia, unspecified: Secondary | ICD-10-CM

## 2016-05-12 DIAGNOSIS — I129 Hypertensive chronic kidney disease with stage 1 through stage 4 chronic kidney disease, or unspecified chronic kidney disease: Secondary | ICD-10-CM | POA: Diagnosis not present

## 2016-05-12 LAB — DIFFERENTIAL
BASOS ABS: 0.2 10*3/uL — AB (ref 0–0.1)
Basophils Relative: 1 %
EOS ABS: 0.5 10*3/uL (ref 0–0.7)
Eosinophils Relative: 4 %
LYMPHS ABS: 2.5 10*3/uL (ref 1.0–3.6)
Lymphocytes Relative: 19 %
MONOS PCT: 9 %
Monocytes Absolute: 1.2 10*3/uL — ABNORMAL HIGH (ref 0.2–1.0)
Neutro Abs: 9.1 10*3/uL — ABNORMAL HIGH (ref 1.4–6.5)
Neutrophils Relative %: 67 %

## 2016-05-12 LAB — C-REACTIVE PROTEIN: CRP: 0.9 mg/dL (ref <1.0)

## 2016-05-12 LAB — CBC
HEMATOCRIT: 42.9 % (ref 40.0–52.0)
HEMOGLOBIN: 13.7 g/dL (ref 13.0–18.0)
MCH: 25.1 pg — AB (ref 26.0–34.0)
MCHC: 31.9 g/dL — AB (ref 32.0–36.0)
MCV: 78.7 fL — ABNORMAL LOW (ref 80.0–100.0)
Platelets: 267 10*3/uL (ref 150–440)
RBC: 5.45 MIL/uL (ref 4.40–5.90)
RDW: 15.7 % — ABNORMAL HIGH (ref 11.5–14.5)
WBC: 13.5 10*3/uL — ABNORMAL HIGH (ref 3.8–10.6)

## 2016-05-12 LAB — SEDIMENTATION RATE: Sed Rate: 12 mm/hr (ref 0–20)

## 2016-05-12 LAB — PATHOLOGIST SMEAR REVIEW

## 2016-05-12 NOTE — Telephone Encounter (Signed)
Called pt's house and spoke to family member to explain that wbc count was a little higher than it was on blood work sent.  In his count the neutrophils which ifs a type of wbc. But the blood test today showed other white cells like basophils and monocytes are elevated so based on that the doctor would like to draw some additional labs and pt is agreeable and is on the way back here.

## 2016-05-12 NOTE — Progress Notes (Signed)
Hematology/Oncology Consult note [facility] Telephone:([phone] [phone]  Patient Care Team: Glean Hess, MD as PCP - General (Family Medicine)   Name of the patient: Jon Mendez  [id number]  03/23/1960    Reason for referral- Leucocytosis  Referring physician- Dr. Carolin Coy   Date of visit: 05/12/16   History of presenting illness- Patient is a 56 -year-old male with a past medical history significant for hypertension and type 2 diabetes with diabetic neuropathy. He is also a long-term smoker and has smoked atleast 1/2 pack per day for last 25-30 years. He has been referred to [place] for leukocytosis. Patient's WBC count has been mostly around well over the last 2 years. Differential mainly shows neutrophilia. His H&H and platelet count have been normal. Most recent blood work from 04/28/2016 showed white count of 12.6, H&H of 14/42.5 and platelet count of 280. CMP was within normal limits. Hemoglobin A1c was 6.5. TSH was normal and lipid panel is within normal limits.  Overall patient reports doing well. Denies any complaints today except for mild hip pain    ECOG PS- 0  Pain scale- 3   Review of systems- Review of Systems  Constitutional: Negative for chills, fever, malaise/fatigue and weight loss.  HENT: Negative for congestion, ear discharge and nosebleeds.   Eyes: Negative for blurred vision.  Respiratory: Negative for cough, hemoptysis, sputum production, shortness of breath and wheezing.   Cardiovascular: Negative for chest pain, palpitations, orthopnea and claudication.  Gastrointestinal: Negative for abdominal pain, blood in stool, constipation, diarrhea, heartburn, melena, nausea and vomiting.  Genitourinary: Negative for dysuria, flank pain, frequency, hematuria and urgency.  Musculoskeletal: Positive for joint pain. Negative for back pain and myalgias.  Skin: Negative for rash.  Neurological: Negative for dizziness,  tingling, focal weakness, seizures, weakness and headaches.  Endo/Heme/Allergies: Does not bruise/bleed easily.  Psychiatric/Behavioral: Negative for depression and suicidal ideas. The patient does not have insomnia.     Allergies  Allergen Reactions  . Eggs Or Egg-Derived Products Shortness Of Breath    Throat swells  . Fish Allergy Shortness Of Breath    Throat swells    Patient Active Problem List   Diagnosis Date Noted  . Leukocytosis 04/29/2016  . Controlled type 2 diabetes mellitus with stage 3 chronic kidney disease, without long-term current use of insulin ([place]) 09/19/2015  . Special screening for malignant neoplasms, colon   . Benign neoplasm of ascending colon   . Rectal polyp   . Benign neoplasm of transverse colon   . Benign neoplasm of sigmoid colon   . Chronic renal insufficiency, stage III (moderate) 04/25/2015  . Chronic gouty arthritis 11/22/2014  . Dyslipidemia 11/22/2014  . Essential (primary) hypertension 11/22/2014  . Compulsive tobacco user syndrome 11/22/2014  . Leg varicosity w ulcer ([place]) 11/22/2014  . Acne inversa 09/06/2012  . Benign paroxysmal positional nystagmus 07/29/2004     Past Medical History:  Diagnosis Date  . Diabetes mellitus without complication ([place])   . H/O hidradenitis suppurativa   . H/O varicose veins   . High cholesterol   . Hypertension   . Vertigo      Past Surgical History:  Procedure Laterality Date  . COLONOSCOPY WITH PROPOFOL N/A 06/04/2015   Procedure: COLONOSCOPY WITH PROPOFOL;  Surgeon: Lucilla Lame, MD;  Location: [facility];  Service: Endoscopy;  Laterality: N/A;  diabetic - oral meds  . HIP ARTHROPLASTY Left 2013  . LEG SKIN LESION  BIOPSY / EXCISION Right 2015  Hidradenitis  . POLYPECTOMY  06/04/2015   Procedure: POLYPECTOMY;  Surgeon: Lucilla Lame, MD;  Location: [facility];  Service: Endoscopy;;  . VARICOSE VEIN SURGERY Bilateral 2015    Social History   Social History  . Marital  status: Married    Spouse name: N/A  . Number of children: N/A  . Years of education: N/A   Occupational History  . Not on file.   Social History Main Topics  . Smoking status: Current Every Day Smoker    Packs/day: 0.25    Years: 30.00    Types: Cigarettes  . Smokeless tobacco: Current User  . Alcohol use No  . Drug use: No  . Sexual activity: Not on file   Other Topics Concern  . Not on file   Social History Narrative  . No narrative on file     Family History  Problem Relation Age of Onset  . Hypertension Mother   . Lymphoma Father   . Diabetes Sister   . Diabetes Brother   . Diabetes Brother      Current Outpatient Prescriptions:  .  allopurinol (ZYLOPRIM) 100 MG tablet, Take 1 tablet (100 mg total) by mouth daily., Disp: 30 tablet, Rfl: 12 .  amLODipine (NORVASC) 5 MG tablet, Take 1 tablet (5 mg total) by mouth daily., Disp: 30 tablet, Rfl: 12 .  amoxicillin (AMOXIL) 875 MG tablet, Take 1 tablet (875 mg total) by mouth 2 (two) times daily., Disp: 20 tablet, Rfl: 0 .  glucose blood (FREESTYLE LITE) test strip, FREESTYLE LITE TEST (In Vitro Strip)  1 (one) Strip Strip two times daily for 50 days  Quantity: 100;  Refills: 5   Ordered :10-Nov-2012  Alison Stalling ;  Started 06-October-2012 Active Comments: dx: 250.02 dispense covered supplies, Disp: , Rfl:  .  losartan-hydrochlorothiazide (HYZAAR) 100-25 MG tablet, Take 1 tablet by mouth daily., Disp: 30 tablet, Rfl: 12 .  Multiple Vitamin (MULTIVITAMIN) capsule, Take 1 capsule by mouth daily., Disp: , Rfl:  .  pravastatin (PRAVACHOL) 40 MG tablet, Take 1 tablet (40 mg total) by mouth at bedtime., Disp: 30 tablet, Rfl: 12 .  SitaGLIPtin-MetFORMIN HCl (JANUMET XR) [phone] MG TB24, Take 1 tablet by mouth daily., Disp: 30 tablet, Rfl: 12   Physical exam:  Vitals:   05/12/16 1149  BP: (!) 153/92  Pulse: 89  Resp: 18  Temp: 99 F (37.2 C)  TempSrc: Tympanic  Weight: 296 lb 4.8 oz (134.4 kg)  Height: '5\' 11"'$  (1.803 m)     Physical Exam  Constitutional: He is oriented to person, place, and time and well-developed, well-nourished, and in no distress.  HENT:  Head: Normocephalic and atraumatic.  Eyes: EOM are normal. Pupils are equal, round, and reactive to light.  Neck: Normal range of motion.  Cardiovascular: Normal rate, regular rhythm and normal heart sounds.   Pulmonary/Chest: Effort normal and breath sounds normal.  Abdominal: Soft. Bowel sounds are normal.  Neurological: He is alert and oriented to person, place, and time.  Skin: Skin is warm and dry.       CMP Latest Ref Rng & Units 04/28/2016  Glucose 65 - 99 mg/dL 102(H)  BUN 6 - 24 mg/dL 21  Creatinine 0.76 - 1.27 mg/dL 1.27  Sodium 134 - 144 mmol/L 137  Potassium 3.5 - 5.2 mmol/L 4.4  Chloride 96 - 106 mmol/L 97  CO2 18 - 29 mmol/L 21  Calcium 8.7 - 10.2 mg/dL 9.7  Total Protein 6.0 - 8.5 g/dL 7.8  Total Bilirubin 0.0 - 1.2 mg/dL 0.4  Alkaline Phos 39 - 117 IU/L 102  AST 0 - 40 IU/L 16  ALT 0 - 44 IU/L 16   CBC Latest Ref Rng & Units 04/28/2016  WBC 3.4 - 10.8 x10E3/uL 12.6(H)  Hemoglobin 13.0 - 18.0 g/dL -  Hematocrit 37.5 - 51.0 % 42.5  Platelets 150 - 379 x10E3/uL 280    No images are attached to the encounter.  No results found.  Assessment and plan- Patient is a 56 y.o. male who has been referred to [place] for leukocytosis/neutrophilia  1. Given that differential on the CBC mainly shows neutrophilia which has remained stable around 12, this is likely reactive secondary to his smoking and/or underlying comorbidities. Today I will obtain a CBC with manual differential ESR and CRP. I will also obtain a pathologic review of his peripheral smear.I will see him back in 6 months time and if his neutrophilia remains stable, I will hold off on further work up. If there is evidence of monocytosis/ basophlia- I will consider bcr abl testing and peripheral flow cytometry   Thank you for this kind referral and the opportunity to  participate in the care of this patient   Visit Diagnosis 1. Leukocytosis, unspecified type     Dr. Randa Evens, MD, MPH [facility] at [facility] Pager- [phone] 05/12/2016  1:06 PM

## 2016-05-13 ENCOUNTER — Other Ambulatory Visit: Payer: Self-pay | Admitting: Internal Medicine

## 2016-05-13 DIAGNOSIS — I10 Essential (primary) hypertension: Secondary | ICD-10-CM

## 2016-05-13 DIAGNOSIS — E785 Hyperlipidemia, unspecified: Secondary | ICD-10-CM

## 2016-05-13 DIAGNOSIS — M1000 Idiopathic gout, unspecified site: Secondary | ICD-10-CM

## 2016-05-20 LAB — BCR-ABL1, CML/ALL, PCR, QUANT

## 2016-05-20 LAB — COMP PANEL: LEUKEMIA/LYMPHOMA

## 2016-09-08 ENCOUNTER — Ambulatory Visit: Payer: Commercial Managed Care - PPO | Admitting: Internal Medicine

## 2016-09-17 ENCOUNTER — Telehealth: Payer: Self-pay | Admitting: *Deleted

## 2016-09-17 ENCOUNTER — Other Ambulatory Visit: Payer: Self-pay | Admitting: Internal Medicine

## 2016-09-17 MED ORDER — VALSARTAN-HYDROCHLOROTHIAZIDE 160-25 MG PO TABS: 1.0000 | ORAL_TABLET | Freq: Every day | ORAL | 5 refills | Status: DC

## 2016-09-17 NOTE — Telephone Encounter (Signed)
Pt's wife, Enes Rokosz, phoned to say that Losartan HCTZ has gone up in price. Pharmacy said an alternative would be Valsartan HCTZ 160-25 if that is okay with Dr. Army Melia. Please advise.

## 2016-09-17 NOTE — Telephone Encounter (Signed)
Patient notified

## 2016-09-17 NOTE — Telephone Encounter (Signed)
Rx for Valsartan HCT sent to [facility] in [place].

## 2016-10-09 ENCOUNTER — Other Ambulatory Visit: Payer: Self-pay | Admitting: Internal Medicine

## 2016-11-10 ENCOUNTER — Inpatient Hospital Stay: Payer: Commercial Managed Care - PPO | Attending: Oncology

## 2016-11-10 ENCOUNTER — Inpatient Hospital Stay: Payer: Commercial Managed Care - PPO | Admitting: Oncology

## 2016-11-17 ENCOUNTER — Other Ambulatory Visit: Payer: Self-pay | Admitting: Internal Medicine

## 2016-11-17 ENCOUNTER — Telehealth: Payer: Self-pay

## 2016-11-17 MED ORDER — OLMESARTAN MEDOXOMIL-HCTZ 40-25 MG PO TABS: 1.0000 | ORAL_TABLET | Freq: Every day | ORAL | 0 refills | Status: DC

## 2016-11-17 NOTE — Telephone Encounter (Signed)
Pt wife called about recall on valsartan- states pt needs alternative meds. Informed we will send 30 days of new meds to [facility] [place]. Told wife pt needs to call and schedule office visit within next 30 days to be seen. She verbalized understanding.

## 2016-11-20 ENCOUNTER — Telehealth: Payer: Self-pay

## 2016-11-20 NOTE — Telephone Encounter (Signed)
Patient wife called about patient stating we sent in [place] HCT for recall to [facility] [place] and they stated that medication is on back order and it could be months before receiving it. She is requesting for different medication to be sent in. Please Advise.

## 2016-11-21 ENCOUNTER — Other Ambulatory Visit: Payer: Self-pay | Admitting: Internal Medicine

## 2016-11-21 MED ORDER — VALSARTAN-HYDROCHLOROTHIAZIDE 160-25 MG PO TABS: 1.0000 | ORAL_TABLET | Freq: Every day | ORAL | 2 refills | Status: DC

## 2016-11-21 NOTE — Telephone Encounter (Signed)
Please call and find out the situation - is the combo that is backordered?  Can I prescribe the two med separatately?

## 2016-11-21 NOTE — Telephone Encounter (Signed)
Spoke to pharmacy and they stated they already filled valsartan-hct. Pharmacist stated this medication is okay to continue taking. Called and spoke to pts wife and informed he can continue Valsartan HCT and its ready to be picked up at pharmacy per pharmacist.

## 2016-12-20 ENCOUNTER — Other Ambulatory Visit: Payer: Self-pay | Admitting: Internal Medicine

## 2017-01-12 ENCOUNTER — Ambulatory Visit (INDEPENDENT_AMBULATORY_CARE_PROVIDER_SITE_OTHER): Payer: Commercial Managed Care - PPO | Admitting: Internal Medicine

## 2017-01-12 ENCOUNTER — Encounter: Payer: Self-pay | Admitting: Oncology

## 2017-01-12 ENCOUNTER — Inpatient Hospital Stay (HOSPITAL_BASED_OUTPATIENT_CLINIC_OR_DEPARTMENT_OTHER): Payer: Commercial Managed Care - PPO | Admitting: Oncology

## 2017-01-12 ENCOUNTER — Encounter: Payer: Self-pay | Admitting: Internal Medicine

## 2017-01-12 ENCOUNTER — Inpatient Hospital Stay: Payer: Commercial Managed Care - PPO | Attending: Oncology

## 2017-01-12 VITALS — BP 136/78 | HR 83 | Ht 71.0 in | Wt 283.0 lb

## 2017-01-12 VITALS — BP 153/92 | HR 80 | Temp 98.8°F | Wt 284.4 lb

## 2017-01-12 DIAGNOSIS — D509 Iron deficiency anemia, unspecified: Secondary | ICD-10-CM | POA: Insufficient documentation

## 2017-01-12 DIAGNOSIS — E1140 Type 2 diabetes mellitus with diabetic neuropathy, unspecified: Secondary | ICD-10-CM | POA: Diagnosis not present

## 2017-01-12 DIAGNOSIS — E1122 Type 2 diabetes mellitus with diabetic chronic kidney disease: Secondary | ICD-10-CM | POA: Diagnosis not present

## 2017-01-12 DIAGNOSIS — D72829 Elevated white blood cell count, unspecified: Secondary | ICD-10-CM

## 2017-01-12 DIAGNOSIS — D729 Disorder of white blood cells, unspecified: Secondary | ICD-10-CM

## 2017-01-12 DIAGNOSIS — E7800 Pure hypercholesterolemia, unspecified: Secondary | ICD-10-CM

## 2017-01-12 DIAGNOSIS — Z79899 Other long term (current) drug therapy: Secondary | ICD-10-CM | POA: Insufficient documentation

## 2017-01-12 DIAGNOSIS — F17210 Nicotine dependence, cigarettes, uncomplicated: Secondary | ICD-10-CM | POA: Insufficient documentation

## 2017-01-12 DIAGNOSIS — I10 Essential (primary) hypertension: Secondary | ICD-10-CM

## 2017-01-12 DIAGNOSIS — M25551 Pain in right hip: Secondary | ICD-10-CM | POA: Insufficient documentation

## 2017-01-12 DIAGNOSIS — N183 Chronic kidney disease, stage 3 unspecified: Secondary | ICD-10-CM

## 2017-01-12 LAB — CBC WITH DIFFERENTIAL/PLATELET
BASOS PCT: 1 %
Basophils Absolute: 0.1 10*3/uL (ref 0–0.1)
EOS ABS: 0.3 10*3/uL (ref 0–0.7)
Eosinophils Relative: 3 %
HEMATOCRIT: 39.1 % — AB (ref 40.0–52.0)
Hemoglobin: 12.9 g/dL — ABNORMAL LOW (ref 13.0–18.0)
Lymphocytes Relative: 17 %
Lymphs Abs: 2.1 10*3/uL (ref 1.0–3.6)
MCH: 25.9 pg — ABNORMAL LOW (ref 26.0–34.0)
MCHC: 32.9 g/dL (ref 32.0–36.0)
MCV: 78.6 fL — ABNORMAL LOW (ref 80.0–100.0)
MONO ABS: 0.7 10*3/uL (ref 0.2–1.0)
MONOS PCT: 6 %
Neutro Abs: 9.2 10*3/uL — ABNORMAL HIGH (ref 1.4–6.5)
Neutrophils Relative %: 73 %
Platelets: 283 10*3/uL (ref 150–440)
RBC: 4.98 MIL/uL (ref 4.40–5.90)
RDW: 15.7 % — AB (ref 11.5–14.5)
WBC: 12.4 10*3/uL — ABNORMAL HIGH (ref 3.8–10.6)

## 2017-01-12 NOTE — Progress Notes (Signed)
Date:  01/12/2017   Name:  Jon Mendez   DOB:  03/23/1960   MRN:  [id number]   Chief Complaint: Diabetes and Hypertension Diabetes  He presents for his follow-up diabetic visit. He has type 2 diabetes mellitus. His disease course has been stable. Pertinent negatives for hypoglycemia include no headaches or tremors. Pertinent negatives for diabetes include no chest pain, no fatigue, no polydipsia and no polyuria. Current diabetic treatment includes oral agent (dual therapy). His breakfast blood glucose is taken between 6-7 am. His breakfast blood glucose range is generally 110-130 mg/dl.  Hypertension  This is a chronic problem. The problem is unchanged. The problem is controlled. Pertinent negatives include no chest pain, headaches, palpitations or shortness of breath.  Hip Pain   There was no injury mechanism. The pain is present in the right hip. The quality of the pain is described as aching. The pain is mild. The pain has been worsening since onset. Associated symptoms include a loss of motion. Pertinent negatives include no numbness. The symptoms are aggravated by weight bearing and movement. He has tried acetaminophen for the symptoms.    Lab Results  Component Value Date   HGBA1C 6.5 (H) 04/28/2016     Review of Systems  Constitutional: Negative for appetite change, fatigue and unexpected weight change.  Eyes: Negative for visual disturbance.  Respiratory: Negative for cough, shortness of breath and wheezing.   Cardiovascular: Negative for chest pain, palpitations and leg swelling.  Gastrointestinal: Negative for abdominal pain and blood in stool.  Endocrine: Negative for polydipsia and polyuria.  Genitourinary: Negative for dysuria and hematuria.  Musculoskeletal: Positive for arthralgias.  Skin: Negative for color change and rash.  Neurological: Negative for tremors, numbness and headaches.  Psychiatric/Behavioral: Negative for dysphoric mood.    Patient Active  Problem List   Diagnosis Date Noted  . Leukocytosis 04/29/2016  . Controlled type 2 diabetes mellitus with stage 3 chronic kidney disease, without long-term current use of insulin ([place]) 09/19/2015  . Special screening for malignant neoplasms, colon   . Benign neoplasm of ascending colon   . Rectal polyp   . Benign neoplasm of transverse colon   . Benign neoplasm of sigmoid colon   . Chronic renal insufficiency, stage III (moderate) ([place]) 04/25/2015  . Chronic gouty arthritis 11/22/2014  . Dyslipidemia 11/22/2014  . Essential (primary) hypertension 11/22/2014  . Compulsive tobacco user syndrome 11/22/2014  . Leg varicosity w ulcer ([place]) 11/22/2014  . Acne inversa 09/06/2012  . Benign paroxysmal positional nystagmus 07/29/2004    Prior to Admission medications   Medication Sig Start Date End Date Taking? Authorizing Provider  allopurinol (ZYLOPRIM) 100 MG tablet TAKE 1 TABLET EVERY DAY 05/13/16  Yes Glean Hess, MD  amLODipine (NORVASC) 5 MG tablet TAKE 1 TABLET BY MOUTH EVERY DAY 05/13/16  Yes Glean Hess, MD  glucose blood (FREESTYLE LITE) test strip FREESTYLE LITE TEST (In Vitro Strip)  1 (one) Strip Strip two times daily for 50 days  Quantity: 100;  Refills: 5   Ordered :10-Nov-2012  Alison Stalling ;  Started 06-October-2012 Active Comments: dx: 250.02 dispense covered supplies 10/06/12  Yes [provider]  JANUMET XR [phone] MG TB24 TAKE 1 TABLET BY MOUTH EVERY DAY 12/20/16  Yes Glean Hess, MD  Multiple Vitamin (MULTIVITAMIN) capsule Take 1 capsule by mouth daily.   Yes [provider]  pravastatin (PRAVACHOL) 40 MG tablet TAKE 1 TABLET BY MOUTH AT BEDTIME 05/13/16  Yes Glean Hess,  MD  valsartan-hydrochlorothiazide (DIOVAN-HCT) 160-25 MG tablet Take 1 tablet by mouth daily. 11/21/16  Yes Glean Hess, MD    Allergies  Allergen Reactions  . Eggs Or Egg-Derived Products Shortness Of Breath    Throat swells  . Fish Allergy Shortness Of  Breath    Throat swells    Past Surgical History:  Procedure Laterality Date  . COLONOSCOPY WITH PROPOFOL N/A 06/04/2015   Procedure: COLONOSCOPY WITH PROPOFOL;  Surgeon: Lucilla Lame, MD;  Location: [place];  Service: Endoscopy;  Laterality: N/A;  diabetic - oral meds  . HIP ARTHROPLASTY Left 2013  . LEG SKIN LESION  BIOPSY / EXCISION Right 2015   Hidradenitis  . POLYPECTOMY  06/04/2015   Procedure: POLYPECTOMY;  Surgeon: Lucilla Lame, MD;  Location: [place];  Service: Endoscopy;;  . VARICOSE VEIN SURGERY Bilateral 2015    Social History  Substance Use Topics  . Smoking status: Current Every Day Smoker    Packs/day: 0.25    Years: 30.00    Types: Cigarettes  . Smokeless tobacco: Never Used     Comment: cutting back- 01/22/2017  . Alcohol use No     Medication list has been reviewed and updated.  PHQ 2/9 Scores 04/28/2016 09/19/2015  PHQ - 2 Score 0 0    Physical Exam  Constitutional: He is oriented to person, place, and time. He appears well-developed. No distress.  HENT:  Head: Normocephalic and atraumatic.  Neck: Normal range of motion. Neck supple. No thyromegaly present.  Cardiovascular: Normal rate, regular rhythm and normal heart sounds.   Pulmonary/Chest: Effort normal and breath sounds normal. No respiratory distress. He has no wheezes.  Musculoskeletal: He exhibits no edema.       Right hip: He exhibits decreased range of motion. He exhibits normal strength and no tenderness.  Neurological: He is alert and oriented to person, place, and time.  Skin: Skin is warm and dry. No rash noted.  Psychiatric: He has a normal mood and affect. His behavior is normal. Thought content normal.  Nursing note and vitals reviewed.   BP 136/78 (BP Location: Right Arm, Patient Position: Sitting, Cuff Size: Large)   Pulse 83   Ht 5\' 11"  (1.803 m)   Wt 283 lb (128.4 kg)   SpO2 98%   BMI 39.47 kg/m   Assessment and Plan: 1. Controlled type 2 diabetes  mellitus with stage 3 chronic kidney disease, without long-term current use of insulin (HCC) Continue current therapy - Basic metabolic panel - Hemoglobin A1c  2. Essential (primary) hypertension controlled  3. Pain of right hip joint Pt declines xray today Will discuss at next visit Can use low dose Aleve and monitor renal function   No orders of the defined types were placed in this encounter.   Partially dictated using Editor, commissioning. Any errors are unintentional.  Halina Maidens, MD [facility]  01/12/2017

## 2017-01-12 NOTE — Progress Notes (Signed)
Patient states that he is feeling well today. He denies having any pain. He is unable to receive the flu vaccine due to his egg allergy.

## 2017-01-12 NOTE — Progress Notes (Signed)
Hematology/Oncology Consult note [facility]  [phone] Fax:([phone]  Patient Care Team: Glean Hess, MD as PCP - General (Family Medicine)   Name of the patient: Jon Mendez  [id number]  03/23/1960   Date of visit: 01/12/17  Diagnosis- 1. Leucocytosis likely reactive 2. Microcytic anemia  Chief complaint/ Reason for visit- routine f/u of leucocytosis  Heme/Onc history: Patient is a 56 -year-old male with a past medical history significant for hypertension and type 2 diabetes with diabetic neuropathy. He is also a long-term smoker and has smoked atleast 1/2 pack per day for last 25-30 years. He has been referred to [place] for leukocytosis. Patient's WBC count has been mostly around well over the last 2 years. Differential mainly shows neutrophilia. His H&H and platelet count have been normal. Most recent blood work from 04/28/2016 showed white count of 12.6, H&H of 14/42.5 and platelet count of 280. CMP was within normal limits. Hemoglobin A1c was 6.5. TSH was normal and lipid panel is within normal limits.  BCR able testing and peripheral flow cytometry was negative in February 2018  Interval history- reports right hip pain which has been ongoing for few months. Denies other complaints  ECOG PS- 0 Pain scale- 3  Review of systems- Review of Systems  Constitutional: Negative for chills, fever, malaise/fatigue and weight loss.  HENT: Negative for congestion, ear discharge and nosebleeds.   Eyes: Negative for blurred vision.  Respiratory: Negative for cough, hemoptysis, sputum production, shortness of breath and wheezing.   Cardiovascular: Negative for chest pain, palpitations, orthopnea and claudication.  Gastrointestinal: Negative for abdominal pain, blood in stool, constipation, diarrhea, heartburn, melena, nausea and vomiting.  Genitourinary: Negative for dysuria, flank pain, frequency, hematuria and urgency.    Musculoskeletal: Negative for back pain, joint pain and myalgias.  Skin: Negative for rash.  Neurological: Negative for dizziness, tingling, focal weakness, seizures, weakness and headaches.  Endo/Heme/Allergies: Does not bruise/bleed easily.  Psychiatric/Behavioral: Negative for depression and suicidal ideas. The patient does not have insomnia.      Allergies  Allergen Reactions  . Eggs Or Egg-Derived Products Shortness Of Breath    Throat swells  . Fish Allergy Shortness Of Breath    Throat swells     Past Medical History:  Diagnosis Date  . Diabetes mellitus without complication ([place])   . H/O hidradenitis suppurativa   . H/O varicose veins   . High cholesterol   . Hypertension   . Vertigo      Past Surgical History:  Procedure Laterality Date  . COLONOSCOPY WITH PROPOFOL N/A 06/04/2015   Procedure: COLONOSCOPY WITH PROPOFOL;  Surgeon: Lucilla Lame, MD;  Location: [place];  Service: Endoscopy;  Laterality: N/A;  diabetic - oral meds  . HIP ARTHROPLASTY Left 2013  . LEG SKIN LESION  BIOPSY / EXCISION Right 2015   Hidradenitis  . POLYPECTOMY  06/04/2015   Procedure: POLYPECTOMY;  Surgeon: Lucilla Lame, MD;  Location: [place];  Service: Endoscopy;;  . VARICOSE VEIN SURGERY Bilateral 2015    Social History   Social History  . Marital status: Married    Spouse name: N/A  . Number of children: N/A  . Years of education: N/A   Occupational History  . Not on file.   Social History Main Topics  . Smoking status: Current Every Day Smoker    Packs/day: 0.25    Years: 30.00    Types: Cigarettes  . Smokeless tobacco: Never Used  Comment: cutting back- 01/22/2017  . Alcohol use No  . Drug use: No  . Sexual activity: Not on file   Other Topics Concern  . Not on file   Social History Narrative  . No narrative on file    Family History  Problem Relation Age of Onset  . Hypertension Mother   . Lymphoma Father   . Diabetes Sister   .  Diabetes Brother   . Diabetes Brother      Current Outpatient Prescriptions:  .  allopurinol (ZYLOPRIM) 100 MG tablet, TAKE 1 TABLET EVERY DAY, Disp: 30 tablet, Rfl: 12 .  amLODipine (NORVASC) 5 MG tablet, TAKE 1 TABLET BY MOUTH EVERY DAY, Disp: 30 tablet, Rfl: 12 .  glucose blood (FREESTYLE LITE) test strip, FREESTYLE LITE TEST (In Vitro Strip)  1 (one) Strip Strip two times daily for 50 days  Quantity: 100;  Refills: 5   Ordered :10-Nov-2012  Alison Stalling ;  Started 06-October-2012 Active Comments: dx: 250.02 dispense covered supplies, Disp: , Rfl:  .  JANUMET XR [id number] MG TB24, TAKE 1 TABLET BY MOUTH EVERY DAY, Disp: 30 tablet, Rfl: 1 .  Multiple Vitamin (MULTIVITAMIN) capsule, Take 1 capsule by mouth daily., Disp: , Rfl:  .  pravastatin (PRAVACHOL) 40 MG tablet, TAKE 1 TABLET BY MOUTH AT BEDTIME, Disp: 30 tablet, Rfl: 12 .  valsartan-hydrochlorothiazide (DIOVAN-HCT) 160-25 MG tablet, Take 1 tablet by mouth daily., Disp: 30 tablet, Rfl: 2  Physical exam:  Vitals:   01/12/17 1113  BP: (!) 153/92  Pulse: 80  Temp: 98.8 F (37.1 C)  TempSrc: Tympanic  Weight: 284 lb 6.3 oz (129 kg)   Physical Exam  Constitutional: He is oriented to person, place, and time and well-developed, well-nourished, and in no distress.  HENT:  Head: Normocephalic and atraumatic.  Eyes: Pupils are equal, round, and reactive to light. EOM are normal.  Neck: Normal range of motion.  Cardiovascular: Normal rate, regular rhythm and normal heart sounds.   Pulmonary/Chest: Effort normal and breath sounds normal.  Abdominal: Soft. Bowel sounds are normal.  Neurological: He is alert and oriented to person, place, and time.  Skin: Skin is warm and dry.     CMP Latest Ref Rng & Units 04/28/2016  Glucose 65 - 99 mg/dL 102(H)  BUN 6 - 24 mg/dL 21  Creatinine 0.76 - 1.27 mg/dL 1.27  Sodium 134 - 144 mmol/L 137  Potassium 3.5 - 5.2 mmol/L 4.4  Chloride 96 - 106 mmol/L 97  CO2 18 - 29 mmol/L 21  Calcium 8.7 - 10.2  mg/dL 9.7  Total Protein 6.0 - 8.5 g/dL 7.8  Total Bilirubin 0.0 - 1.2 mg/dL 0.4  Alkaline Phos 39 - 117 IU/L 102  AST 0 - 40 IU/L 16  ALT 0 - 44 IU/L 16   CBC Latest Ref Rng & Units 01/12/2017  WBC 3.8 - 10.6 K/uL 12.4(H)  Hemoglobin 13.0 - 18.0 g/dL 12.9(L)  Hematocrit 40.0 - 52.0 % 39.1(L)  Platelets 150 - 440 K/uL 283     Assessment and plan- Patient is a 56 y.o. male who sees [place] for leucocytosis/ neutrophilia  Neutrophilia likely reactive due to smoking. It waxes and wanes. Not increasing steadily. BCR abl and periphera; flow cytometry testing negative. Continue to monitor. erpeat cbc with diff in 6 months and 1 year and I will seehim in 1 years time  Mild microcytic anemia- check iron studies with next visit. His last colonoscopy was in 2017 which showed multiple polyps and he  is due for one in 2 years time   Visit Diagnosis 1. Neutrophilia   2. Microcytic anemia      Dr. Randa Evens, MD, MPH [facility] at [facility] Pager- [phone] 01/12/2017 12:04 PM

## 2017-01-12 NOTE — Patient Instructions (Signed)
May take low dose of Naproxen (Aleve) 1-2 per day but not on a regular basis

## 2017-01-13 LAB — BASIC METABOLIC PANEL
BUN/Creatinine Ratio: 13 (ref 9–20)
BUN: 16 mg/dL (ref 6–24)
CHLORIDE: 103 mmol/L (ref 96–106)
CO2: 22 mmol/L (ref 20–29)
CREATININE: 1.22 mg/dL (ref 0.76–1.27)
Calcium: 9.5 mg/dL (ref 8.7–10.2)
GFR calc Af Amer: 76 mL/min/{1.73_m2} (ref >59)
GFR calc non Af Amer: 66 mL/min/{1.73_m2} (ref >59)
GLUCOSE: 89 mg/dL (ref 65–99)
Potassium: 4.7 mmol/L (ref 3.5–5.2)
SODIUM: 140 mmol/L (ref 134–144)

## 2017-01-13 LAB — HEMOGLOBIN A1C
ESTIMATED AVERAGE GLUCOSE: 134 mg/dL
Hgb A1c MFr Bld: 6.3 % — ABNORMAL HIGH (ref 4.8–5.6)

## 2017-03-09 ENCOUNTER — Other Ambulatory Visit: Payer: Self-pay | Admitting: Internal Medicine

## 2017-03-17 ENCOUNTER — Other Ambulatory Visit: Payer: Self-pay | Admitting: Internal Medicine

## 2017-03-17 MED ORDER — OLMESARTAN MEDOXOMIL-HCTZ 40-25 MG PO TABS: 1.0000 | ORAL_TABLET | Freq: Every day | ORAL | 5 refills | Status: DC

## 2017-05-14 ENCOUNTER — Other Ambulatory Visit: Payer: Self-pay | Admitting: Internal Medicine

## 2017-05-14 DIAGNOSIS — I10 Essential (primary) hypertension: Secondary | ICD-10-CM

## 2017-05-14 DIAGNOSIS — M1000 Idiopathic gout, unspecified site: Secondary | ICD-10-CM

## 2017-05-14 DIAGNOSIS — E785 Hyperlipidemia, unspecified: Secondary | ICD-10-CM

## 2017-06-04 ENCOUNTER — Other Ambulatory Visit: Payer: Self-pay | Admitting: Internal Medicine

## 2017-06-04 MED ORDER — OLMESARTAN MEDOXOMIL-HCTZ 40-25 MG PO TABS: 1.0000 | ORAL_TABLET | Freq: Every day | ORAL | 1 refills | Status: DC

## 2017-06-29 ENCOUNTER — Telehealth: Payer: Self-pay

## 2017-06-29 NOTE — Telephone Encounter (Signed)
Patient wife called stating we had to change patient medication from losartan to olmesartan. The price of the medication has went up to $30. They want a cheaper alternative. Informed the wife that there is no way on our end to find out what is cheaper. She will need to contact there insurance company and find out what meds are cheaper in that same class of medication. She verbalized understanding.

## 2017-07-13 ENCOUNTER — Inpatient Hospital Stay: Payer: Commercial Managed Care - PPO | Attending: Oncology

## 2017-07-18 ENCOUNTER — Encounter: Payer: Self-pay | Admitting: Internal Medicine

## 2017-07-20 ENCOUNTER — Encounter: Payer: Commercial Managed Care - PPO | Admitting: Internal Medicine

## 2017-07-20 NOTE — Progress Notes (Deleted)
Date:  07/20/2017   Name:  Jon Mendez   DOB:  03/23/1960   MRN:  [id number]   Chief Complaint: No chief complaint on file. Jon Mendez is a 57 y.o. male who presents today for his Complete Annual Exam. He feels {DESC; WELL/FAIRLY WELL/POORLY:18703}. He reports exercising ***. He reports he is sleeping {DESC; WELL/FAIRLY WELL/POORLY:18703}.   Hypertension  This is a chronic problem. The problem is controlled. Past treatments include angiotensin blockers, diuretics and calcium channel blockers. The current treatment provides significant improvement.  Diabetes  He presents for his follow-up diabetic visit. He has type 2 diabetes mellitus. His disease course has been stable. Current diabetic treatment includes oral agent (dual therapy). An ACE inhibitor/angiotensin II receptor blocker is being taken. Eye exam is not current.  Hyperlipidemia  This is a chronic problem. The problem is controlled. Current antihyperlipidemic treatment includes statins.   Lab Results  Component Value Date   HGBA1C 6.3 (H) 01/12/2017   Lab Results  Component Value Date   CHOL 176 04/28/2016   HDL 41 04/28/2016   LDLCALC 106 (H) 04/28/2016   TRIG 146 04/28/2016   CHOLHDL 4.3 04/28/2016   Lab Results  Component Value Date   CREATININE 1.22 01/12/2017   BUN 16 01/12/2017   NA 140 01/12/2017   K 4.7 01/12/2017   CL 103 01/12/2017   CO2 22 01/12/2017      Review of Systems  Patient Active Problem List   Diagnosis Date Noted  . Controlled type 2 diabetes mellitus with stage 3 chronic kidney disease, without long-term current use of insulin ([place]) 09/19/2015  . Tubular adenoma of colon   . Chronic renal insufficiency, stage III (moderate) ([place]) 04/25/2015  . Chronic gouty arthritis 11/22/2014  . Hyperlipidemia associated with type 2 diabetes mellitus ([place]) 11/22/2014  . Essential (primary) hypertension 11/22/2014  . Compulsive tobacco user syndrome 11/22/2014  . Leg varicosity w  ulcer ([place]) 11/22/2014  . Hidradenitis suppurativa 09/06/2012  . Benign paroxysmal positional nystagmus 07/29/2004    Prior to Admission medications   Medication Sig Start Date End Date Taking? Authorizing Provider  allopurinol (ZYLOPRIM) 100 MG tablet TAKE 1 TABLET EVERY DAY 05/14/17   Glean Hess, MD  amLODipine (NORVASC) 5 MG tablet TAKE 1 TABLET BY MOUTH EVERY DAY 05/14/17   Glean Hess, MD  glucose blood (FREESTYLE LITE) test strip FREESTYLE LITE TEST (In Vitro Strip)  1 (one) Strip Strip two times daily for 50 days  Quantity: 100;  Refills: 5   Ordered :10-Nov-2012  Alison Stalling ;  Started 06-October-2012 Active Comments: dx: 250.02 dispense covered supplies 10/06/12   [provider]  JANUMET XR [phone] MG TB24 TAKE 1 TABLET BY MOUTH EVERY DAY 03/09/17   Glean Hess, MD  Multiple Vitamin (MULTIVITAMIN) capsule Take 1 capsule by mouth daily.    [provider]  olmesartan-hydrochlorothiazide (BENICAR HCT) 40-25 MG tablet Take 1 tablet by mouth daily. 06/04/17   Glean Hess, MD  pravastatin (PRAVACHOL) 40 MG tablet TAKE 1 TABLET BY MOUTH AT BEDTIME 05/14/17   Glean Hess, MD    Allergies  Allergen Reactions  . Eggs Or Egg-Derived Products Shortness Of Breath    Throat swells  . Fish Allergy Shortness Of Breath    Throat swells    Past Surgical History:  Procedure Laterality Date  . COLONOSCOPY WITH PROPOFOL N/A 06/04/2015   Procedure: COLONOSCOPY WITH PROPOFOL;  Surgeon: Lucilla Lame, MD;  5 tubular adenomas  .  HIP ARTHROPLASTY Left 2013  . LEG SKIN LESION  BIOPSY / EXCISION Right 2015   Hidradenitis  . POLYPECTOMY  06/04/2015   Procedure: POLYPECTOMY;  Surgeon: Lucilla Lame, MD;  Location: [place];  Service: Endoscopy;;  . VARICOSE VEIN SURGERY Bilateral 2015    Social History   Tobacco Use  . Smoking status: Current Every Day Smoker    Packs/day: 0.25    Years: 30.00    Pack years: 7.50    Types: Cigarettes  .  Smokeless tobacco: Never Used  . Tobacco comment: cutting back- 01/22/2017  Substance Use Topics  . Alcohol use: No    Alcohol/week: 2.4 oz    Types: 4 Standard drinks or equivalent per week  . Drug use: No     Medication list has been reviewed and updated.  PHQ 2/9 Scores 04/28/2016 09/19/2015  PHQ - 2 Score 0 0    Physical Exam  There were no vitals taken for this visit.  Assessment and Plan:

## 2017-10-06 ENCOUNTER — Other Ambulatory Visit: Payer: Self-pay | Admitting: Internal Medicine

## 2017-12-24 ENCOUNTER — Ambulatory Visit (INDEPENDENT_AMBULATORY_CARE_PROVIDER_SITE_OTHER): Payer: Commercial Managed Care - PPO | Admitting: Internal Medicine

## 2017-12-24 ENCOUNTER — Encounter: Payer: Self-pay | Admitting: Internal Medicine

## 2017-12-24 VITALS — BP 114/68 | HR 91 | Ht 71.0 in | Wt 277.0 lb

## 2017-12-24 DIAGNOSIS — D509 Iron deficiency anemia, unspecified: Secondary | ICD-10-CM

## 2017-12-24 DIAGNOSIS — F17200 Nicotine dependence, unspecified, uncomplicated: Secondary | ICD-10-CM

## 2017-12-24 DIAGNOSIS — E1122 Type 2 diabetes mellitus with diabetic chronic kidney disease: Secondary | ICD-10-CM | POA: Diagnosis not present

## 2017-12-24 DIAGNOSIS — D729 Disorder of white blood cells, unspecified: Secondary | ICD-10-CM

## 2017-12-24 DIAGNOSIS — N183 Chronic kidney disease, stage 3 unspecified: Secondary | ICD-10-CM

## 2017-12-24 DIAGNOSIS — E1169 Type 2 diabetes mellitus with other specified complication: Secondary | ICD-10-CM | POA: Diagnosis not present

## 2017-12-24 DIAGNOSIS — Z0000 Encounter for general adult medical examination without abnormal findings: Secondary | ICD-10-CM | POA: Diagnosis not present

## 2017-12-24 DIAGNOSIS — I10 Essential (primary) hypertension: Secondary | ICD-10-CM | POA: Diagnosis not present

## 2017-12-24 DIAGNOSIS — Z125 Encounter for screening for malignant neoplasm of prostate: Secondary | ICD-10-CM | POA: Diagnosis not present

## 2017-12-24 DIAGNOSIS — E785 Hyperlipidemia, unspecified: Secondary | ICD-10-CM

## 2017-12-24 LAB — POCT URINALYSIS DIPSTICK
Bilirubin, UA: NEGATIVE
GLUCOSE UA: NEGATIVE
Ketones, UA: NEGATIVE
LEUKOCYTES UA: NEGATIVE
NITRITE UA: NEGATIVE
PROTEIN UA: NEGATIVE
RBC UA: NEGATIVE
SPEC GRAV UA: 1.01 (ref 1.010–1.025)
Urobilinogen, UA: 0.2 E.U./dL
pH, UA: 7 (ref 5.0–8.0)

## 2017-12-24 MED ORDER — GLUCOSE BLOOD VI STRP: ORAL_STRIP | 12 refills | Status: DC

## 2017-12-24 MED ORDER — FREESTYLE LANCETS MISC: 12 refills | Status: DC

## 2017-12-24 NOTE — Patient Instructions (Signed)
Schedule a Diabetic eye exam

## 2017-12-24 NOTE — Progress Notes (Signed)
Date:  12/24/2017   Name:  Jon Mendez   DOB:  03/23/1960   MRN:  [id number]   Chief Complaint: Annual Exam (Allergic to eggs- NO FLU SHOT.); Hypertension; Diabetes; and Hyperlipidemia Jon Mendez is a 57 y.o. male who presents today for his Complete Annual Exam. He feels well. He reports exercising walking some. He reports he is sleeping well. He is due for DM eye exam.  Colonoscopy due next year. 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  Diabetes  He presents for his follow-up diabetic visit. He has type 2 diabetes mellitus. His disease course has been stable. Pertinent negatives for hypoglycemia include no dizziness or headaches. Pertinent negatives for diabetes include no chest pain and no fatigue. Diabetic complications include nephropathy. Pertinent negatives for diabetic complications include no heart disease, peripheral neuropathy or retinopathy. Current diabetic treatment includes oral agent (dual therapy). He is compliant with treatment all of the time. His weight is stable. He monitors blood glucose at home 1-2 x per week. There is no change in his home blood glucose trend. An ACE inhibitor/angiotensin II receptor blocker is being taken. Eye exam is not current.  Hypertension  This is a chronic problem. The problem is controlled. Pertinent negatives include no chest pain, headaches, palpitations, peripheral edema or shortness of breath. Past treatments include angiotensin blockers, calcium channel blockers and diuretics. There are no compliance problems.  Hypertensive end-organ damage includes kidney disease. There is no history of retinopathy.  Hyperlipidemia  This is a chronic problem. Pertinent negatives include no chest pain, myalgias or shortness of breath. Current antihyperlipidemic treatment includes statins. The current treatment provides no improvement of lipids.   Lab Results  Component Value Date   HGBA1C 6.3 (H) 01/12/2017   Lab Results    Component Value Date   CHOL 176 04/28/2016   HDL 41 04/28/2016   LDLCALC 106 (H) 04/28/2016   TRIG 146 04/28/2016   CHOLHDL 4.3 04/28/2016   Lab Results  Component Value Date   CREATININE 1.22 01/12/2017   BUN 16 01/12/2017   NA 140 01/12/2017   K 4.7 01/12/2017   CL 103 01/12/2017   CO2 22 01/12/2017    Review of Systems  Constitutional: Negative for appetite change, chills, diaphoresis, fatigue and unexpected weight change.  HENT: Negative for hearing loss, tinnitus, trouble swallowing and voice change.   Eyes: Negative for visual disturbance.  Respiratory: Negative for choking, shortness of breath and wheezing.   Cardiovascular: Negative for chest pain, palpitations and leg swelling.  Gastrointestinal: Negative for abdominal pain, blood in stool, constipation and diarrhea.  Genitourinary: Negative for difficulty urinating, dysuria and frequency.  Musculoskeletal: Positive for arthralgias (foot pain). Negative for back pain and myalgias.  Skin: Negative for color change and rash.  Neurological: Negative for dizziness, syncope and headaches.  Hematological: Negative for adenopathy.  Psychiatric/Behavioral: Negative for dysphoric mood and sleep disturbance.    Patient Active Problem List   Diagnosis Date Noted  . Controlled type 2 diabetes mellitus with stage 3 chronic kidney disease, without long-term current use of insulin ([place]) 09/19/2015  . Tubular adenoma of colon   . Chronic renal insufficiency, stage III (moderate) ([place]) 04/25/2015  . Chronic gouty arthritis 11/22/2014  . Hyperlipidemia associated with type 2 diabetes mellitus ([place]) 11/22/2014  . Essential (primary) hypertension 11/22/2014  . Compulsive tobacco user syndrome 11/22/2014  . Varicose veins of both lower extremities 11/22/2014  . Hidradenitis suppurativa 09/06/2012  . Benign  paroxysmal positional nystagmus 07/29/2004    Allergies  Allergen Reactions  . Eggs Or Egg-Derived Products Shortness Of  Breath    Throat swells  . Fish Allergy Shortness Of Breath    Throat swells    Past Surgical History:  Procedure Laterality Date  . COLONOSCOPY WITH PROPOFOL N/A 06/04/2015   Procedure: COLONOSCOPY WITH PROPOFOL;  Surgeon: Lucilla Lame, MD;  5 tubular adenomas  . HIP ARTHROPLASTY Left 2013  . LEG SKIN LESION  BIOPSY / EXCISION Right 2015   Hidradenitis  . POLYPECTOMY  06/04/2015   Procedure: POLYPECTOMY;  Surgeon: Lucilla Lame, MD;  Location: [place];  Service: Endoscopy;;  . VARICOSE VEIN SURGERY Bilateral 2015    Social History   Tobacco Use  . Smoking status: Current Every Day Smoker    Packs/day: 0.25    Years: 30.00    Pack years: 7.50    Types: Cigarettes  . Smokeless tobacco: Never Used  . Tobacco comment: cutting back- 01/22/2017  Substance Use Topics  . Alcohol use: No    Alcohol/week: 4.0 standard drinks    Types: 4 Standard drinks or equivalent per week  . Drug use: No     Medication list has been reviewed and updated.  Current Meds  Medication Sig  . allopurinol (ZYLOPRIM) 100 MG tablet TAKE 1 TABLET EVERY DAY  . amLODipine (NORVASC) 5 MG tablet TAKE 1 TABLET BY MOUTH EVERY DAY  . glucose blood (FREESTYLE LITE) test strip FREESTYLE LITE TEST (In Vitro Strip)  1 (one) Strip Strip two times daily for 50 days  Quantity: 100;  Refills: 5   Ordered :10-Nov-2012  Alison Stalling ;  Started 06-October-2012 Active Comments: dx: 250.02 dispense covered supplies  . JANUMET XR [phone] MG TB24 TAKE 1 TABLET BY MOUTH EVERY DAY  . Multiple Vitamin (MULTIVITAMIN) capsule Take 1 capsule by mouth daily.  Marland Kitchen olmesartan-hydrochlorothiazide (BENICAR HCT) 40-25 MG tablet Take 1 tablet by mouth daily.  . pravastatin (PRAVACHOL) 40 MG tablet TAKE 1 TABLET BY MOUTH AT BEDTIME    PHQ 2/9 Scores 12/24/2017 04/28/2016 09/19/2015  PHQ - 2 Score 0 0 0    Physical Exam  Constitutional: He is oriented to person, place, and time. He appears well-developed and well-nourished.   HENT:  Head: Normocephalic.  Right Ear: Tympanic membrane, external ear and ear canal normal.  Left Ear: Tympanic membrane, external ear and ear canal normal.  Nose: Nose normal.  Mouth/Throat: Uvula is midline and oropharynx is clear and moist.  Eyes: Pupils are equal, round, and reactive to light. Conjunctivae and EOM are normal.  Neck: Normal range of motion. Neck supple. Carotid bruit is not present. No thyromegaly present.  Cardiovascular: Normal rate, regular rhythm, normal heart sounds and intact distal pulses.  Pulmonary/Chest: Effort normal and breath sounds normal. He has no wheezes. Right breast exhibits no mass. Left breast exhibits no mass.  Abdominal: Soft. Normal appearance and bowel sounds are normal. There is no hepatosplenomegaly. There is no tenderness.  Musculoskeletal: Normal range of motion.  Lymphadenopathy:    He has no cervical adenopathy.  Neurological: He is alert and oriented to person, place, and time. He has normal reflexes.  Skin: Skin is warm, dry and intact.  Multiple scars from previous comedones and infections in the groin area.  No open skin, drainage or blisters.  Psychiatric: He has a normal mood and affect. His speech is normal and behavior is normal. Judgment and thought content normal.  Nursing note and vitals reviewed.  BP 114/68 (BP Location: Right Arm, Patient Position: Sitting, Cuff Size: Normal)   Pulse 91   Ht 5\' 11"  (1.803 m)   Wt 277 lb (125.6 kg)   SpO2 98%   BMI 38.63 kg/m   Assessment and Plan: 1. Annual physical exam Encourage better diet, weight loss - POCT urinalysis dipstick  2. Prostate cancer screening DRE deferred - PSA  3. Controlled type 2 diabetes mellitus with stage 3 chronic kidney disease, without long-term current use of insulin ([facility]) Continue current therapy - glucose blood (FREESTYLE LITE) test strip; Use as instructed  Dispense: 100 each; Refill: 12 - Lancets (FREESTYLE) lancets; Use as instructed   Dispense: 100 each; Refill: 12 - Hemoglobin A1c  4. Essential (primary) hypertension controlled - CBC with Differential/Platelet - Comprehensive metabolic panel - TSH  5. Hyperlipidemia associated with type 2 diabetes mellitus ([place]) On statin therapy - Lipid panel  6. Compulsive tobacco user syndrome Cutting back - continue and try to stop completely  7. Microcytic anemia Seen by Hematology - Iron, TIBC and Ferritin Panel  8. Neutrophilia Felt by hematology to be due to smoking - CBC with Differential/Platelet   Partially dictated using Editor, commissioning. Any errors are unintentional.  Halina Maidens, MD [facility]  12/24/2017

## 2017-12-26 LAB — CBC WITH DIFFERENTIAL/PLATELET
Basophils Absolute: 0.1 10*3/uL (ref 0.0–0.2)
Basos: 1 %
EOS (ABSOLUTE): 0.3 10*3/uL (ref 0.0–0.4)
EOS: 2 %
HEMATOCRIT: 41.3 % (ref 37.5–51.0)
HEMOGLOBIN: 13.5 g/dL (ref 13.0–17.7)
Immature Grans (Abs): 0 10*3/uL (ref 0.0–0.1)
Immature Granulocytes: 0 %
LYMPHS ABS: 2.5 10*3/uL (ref 0.7–3.1)
Lymphs: 19 %
MCH: 25.6 pg — AB (ref 26.6–33.0)
MCHC: 32.7 g/dL (ref 31.5–35.7)
MCV: 78 fL — AB (ref 79–97)
MONOS ABS: 0.9 10*3/uL (ref 0.1–0.9)
Monocytes: 7 %
NEUTROS ABS: 9.4 10*3/uL — AB (ref 1.4–7.0)
Neutrophils: 71 %
Platelets: 301 10*3/uL (ref 150–450)
RBC: 5.28 x10E6/uL (ref 4.14–5.80)
RDW: 15.1 % (ref 12.3–15.4)
WBC: 13.2 10*3/uL — AB (ref 3.4–10.8)

## 2017-12-26 LAB — IRON,TIBC AND FERRITIN PANEL
FERRITIN: 320 ng/mL (ref 30–400)
Iron Saturation: 15 % (ref 15–55)
Iron: 61 ug/dL (ref 38–169)
TIBC: 401 ug/dL (ref 250–450)
UIBC: 340 ug/dL (ref 111–343)

## 2017-12-26 LAB — COMPREHENSIVE METABOLIC PANEL
A/G RATIO: 1.3 (ref 1.2–2.2)
ALBUMIN: 4.5 g/dL (ref 3.5–5.5)
ALK PHOS: 126 IU/L — AB (ref 39–117)
ALT: 19 IU/L (ref 0–44)
AST: 21 IU/L (ref 0–40)
BILIRUBIN TOTAL: 0.5 mg/dL (ref 0.0–1.2)
BUN / CREAT RATIO: 16 (ref 9–20)
BUN: 28 mg/dL — ABNORMAL HIGH (ref 6–24)
CO2: 18 mmol/L — AB (ref 20–29)
Calcium: 9.5 mg/dL (ref 8.7–10.2)
Chloride: 101 mmol/L (ref 96–106)
Creatinine, Ser: 1.7 mg/dL — ABNORMAL HIGH (ref 0.76–1.27)
GFR calc Af Amer: 51 mL/min/{1.73_m2} — ABNORMAL LOW (ref >59)
GFR calc non Af Amer: 44 mL/min/{1.73_m2} — ABNORMAL LOW (ref >59)
GLOBULIN, TOTAL: 3.6 g/dL (ref 1.5–4.5)
Glucose: 95 mg/dL (ref 65–99)
POTASSIUM: 5.1 mmol/L (ref 3.5–5.2)
SODIUM: 139 mmol/L (ref 134–144)
Total Protein: 8.1 g/dL (ref 6.0–8.5)

## 2017-12-26 LAB — HEMOGLOBIN A1C
Est. average glucose Bld gHb Est-mCnc: 137 mg/dL
Hgb A1c MFr Bld: 6.4 % — ABNORMAL HIGH (ref 4.8–5.6)

## 2017-12-26 LAB — LIPID PANEL
CHOL/HDL RATIO: 4.5 ratio (ref 0.0–5.0)
Cholesterol, Total: 195 mg/dL (ref 100–199)
HDL: 43 mg/dL (ref >39)
LDL CALC: 123 mg/dL — AB (ref 0–99)
Triglycerides: 146 mg/dL (ref 0–149)
VLDL Cholesterol Cal: 29 mg/dL (ref 5–40)

## 2017-12-26 LAB — PSA: PROSTATE SPECIFIC AG, SERUM: 1.8 ng/mL (ref 0.0–4.0)

## 2017-12-26 LAB — TSH: TSH: 1.78 u[IU]/mL (ref 0.450–4.500)

## 2018-01-10 ENCOUNTER — Other Ambulatory Visit: Payer: Self-pay | Admitting: *Deleted

## 2018-01-10 DIAGNOSIS — D729 Disorder of white blood cells, unspecified: Secondary | ICD-10-CM

## 2018-01-11 ENCOUNTER — Other Ambulatory Visit: Payer: Commercial Managed Care - PPO

## 2018-01-11 ENCOUNTER — Ambulatory Visit: Payer: Commercial Managed Care - PPO | Admitting: Oncology

## 2018-01-29 ENCOUNTER — Other Ambulatory Visit: Payer: Self-pay | Admitting: Internal Medicine

## 2018-02-15 ENCOUNTER — Other Ambulatory Visit: Payer: Self-pay | Admitting: Internal Medicine

## 2018-02-15 DIAGNOSIS — I10 Essential (primary) hypertension: Secondary | ICD-10-CM

## 2018-02-15 MED ORDER — VALSARTAN-HYDROCHLOROTHIAZIDE 160-25 MG PO TABS: 1.0000 | ORAL_TABLET | Freq: Every day | ORAL | 5 refills | Status: DC

## 2018-05-02 ENCOUNTER — Other Ambulatory Visit: Payer: Self-pay | Admitting: Internal Medicine

## 2018-05-03 ENCOUNTER — Ambulatory Visit: Payer: Commercial Managed Care - PPO

## 2018-05-03 ENCOUNTER — Emergency Department: Payer: Commercial Managed Care - PPO

## 2018-05-03 ENCOUNTER — Other Ambulatory Visit: Payer: Self-pay

## 2018-05-03 ENCOUNTER — Ambulatory Visit
Admission: EM | Admit: 2018-05-03 | Discharge: 2018-05-03 | Disposition: A | Discharge status: Home / Self Care | Payer: Commercial Managed Care - PPO | Source: Home / Self Care | Attending: Family Medicine | Admitting: Family Medicine

## 2018-05-03 ENCOUNTER — Encounter: Payer: Self-pay | Admitting: Emergency Medicine

## 2018-05-03 ENCOUNTER — Inpatient Hospital Stay
Admission: EM | Admit: 2018-05-03 | Discharge: 2018-05-06 | DRG: 308 | Disposition: A | Discharge status: Home / Self Care | Payer: Commercial Managed Care - PPO | Source: Ambulatory Visit | Attending: Internal Medicine | Admitting: Internal Medicine

## 2018-05-03 DIAGNOSIS — J189 Pneumonia, unspecified organism: Secondary | ICD-10-CM | POA: Diagnosis present

## 2018-05-03 DIAGNOSIS — Z7984 Long term (current) use of oral hypoglycemic drugs: Secondary | ICD-10-CM | POA: Diagnosis not present

## 2018-05-03 DIAGNOSIS — N1832 Chronic kidney disease, stage 3b: Secondary | ICD-10-CM | POA: Diagnosis present

## 2018-05-03 DIAGNOSIS — Z96642 Presence of left artificial hip joint: Secondary | ICD-10-CM | POA: Diagnosis present

## 2018-05-03 DIAGNOSIS — I10 Essential (primary) hypertension: Secondary | ICD-10-CM | POA: Diagnosis present

## 2018-05-03 DIAGNOSIS — Z833 Family history of diabetes mellitus: Secondary | ICD-10-CM

## 2018-05-03 DIAGNOSIS — Z807 Family history of other malignant neoplasms of lymphoid, hematopoietic and related tissues: Secondary | ICD-10-CM | POA: Diagnosis not present

## 2018-05-03 DIAGNOSIS — I4891 Unspecified atrial fibrillation: Principal | ICD-10-CM | POA: Diagnosis not present

## 2018-05-03 DIAGNOSIS — Z720 Tobacco use: Secondary | ICD-10-CM | POA: Diagnosis not present

## 2018-05-03 DIAGNOSIS — J40 Bronchitis, not specified as acute or chronic: Secondary | ICD-10-CM | POA: Diagnosis present

## 2018-05-03 DIAGNOSIS — E1169 Type 2 diabetes mellitus with other specified complication: Secondary | ICD-10-CM | POA: Diagnosis present

## 2018-05-03 DIAGNOSIS — R0603 Acute respiratory distress: Secondary | ICD-10-CM | POA: Diagnosis present

## 2018-05-03 DIAGNOSIS — E7800 Pure hypercholesterolemia, unspecified: Secondary | ICD-10-CM | POA: Diagnosis present

## 2018-05-03 DIAGNOSIS — I959 Hypotension, unspecified: Secondary | ICD-10-CM | POA: Diagnosis not present

## 2018-05-03 DIAGNOSIS — N183 Chronic kidney disease, stage 3 (moderate): Secondary | ICD-10-CM | POA: Diagnosis present

## 2018-05-03 DIAGNOSIS — Z79899 Other long term (current) drug therapy: Secondary | ICD-10-CM | POA: Diagnosis not present

## 2018-05-03 DIAGNOSIS — I129 Hypertensive chronic kidney disease with stage 1 through stage 4 chronic kidney disease, or unspecified chronic kidney disease: Secondary | ICD-10-CM | POA: Diagnosis present

## 2018-05-03 DIAGNOSIS — I480 Paroxysmal atrial fibrillation: Secondary | ICD-10-CM

## 2018-05-03 DIAGNOSIS — N1831 Chronic kidney disease, stage 3a: Secondary | ICD-10-CM | POA: Diagnosis present

## 2018-05-03 DIAGNOSIS — E785 Hyperlipidemia, unspecified: Secondary | ICD-10-CM | POA: Diagnosis present

## 2018-05-03 DIAGNOSIS — R059 Cough, unspecified: Secondary | ICD-10-CM

## 2018-05-03 DIAGNOSIS — R05 Cough: Secondary | ICD-10-CM

## 2018-05-03 DIAGNOSIS — Z8249 Family history of ischemic heart disease and other diseases of the circulatory system: Secondary | ICD-10-CM | POA: Diagnosis not present

## 2018-05-03 DIAGNOSIS — F17210 Nicotine dependence, cigarettes, uncomplicated: Secondary | ICD-10-CM | POA: Diagnosis present

## 2018-05-03 DIAGNOSIS — I491 Atrial premature depolarization: Secondary | ICD-10-CM | POA: Diagnosis not present

## 2018-05-03 DIAGNOSIS — E1122 Type 2 diabetes mellitus with diabetic chronic kidney disease: Secondary | ICD-10-CM | POA: Diagnosis present

## 2018-05-03 DIAGNOSIS — E118 Type 2 diabetes mellitus with unspecified complications: Secondary | ICD-10-CM | POA: Diagnosis present

## 2018-05-03 DIAGNOSIS — J209 Acute bronchitis, unspecified: Secondary | ICD-10-CM | POA: Diagnosis not present

## 2018-05-03 DIAGNOSIS — R062 Wheezing: Secondary | ICD-10-CM

## 2018-05-03 DIAGNOSIS — I499 Cardiac arrhythmia, unspecified: Secondary | ICD-10-CM | POA: Diagnosis not present

## 2018-05-03 DIAGNOSIS — E860 Dehydration: Secondary | ICD-10-CM | POA: Diagnosis present

## 2018-05-03 HISTORY — DX: Chronic kidney disease, stage 3 (moderate): N18.3

## 2018-05-03 HISTORY — DX: Chronic kidney disease, stage 3 unspecified: N18.30

## 2018-05-03 LAB — CBC WITH DIFFERENTIAL/PLATELET
ABS IMMATURE GRANULOCYTES: 0.07 10*3/uL (ref 0.00–0.07)
Basophils Absolute: 0.1 10*3/uL (ref 0.0–0.1)
Basophils Relative: 0 %
Eosinophils Absolute: 0.2 10*3/uL (ref 0.0–0.5)
Eosinophils Relative: 1 %
HCT: 43.2 % (ref 39.0–52.0)
Hemoglobin: 13.9 g/dL (ref 13.0–17.0)
Immature Granulocytes: 0 %
LYMPHS ABS: 3.1 10*3/uL (ref 0.7–4.0)
Lymphocytes Relative: 20 %
MCH: 25.1 pg — ABNORMAL LOW (ref 26.0–34.0)
MCHC: 32.2 g/dL (ref 30.0–36.0)
MCV: 78 fL — ABNORMAL LOW (ref 80.0–100.0)
Monocytes Absolute: 1.6 10*3/uL — ABNORMAL HIGH (ref 0.1–1.0)
Monocytes Relative: 10 %
Neutro Abs: 10.5 10*3/uL — ABNORMAL HIGH (ref 1.7–7.7)
Neutrophils Relative %: 69 %
Platelets: 312 10*3/uL (ref 150–400)
RBC: 5.54 MIL/uL (ref 4.22–5.81)
RDW: 15.6 % — ABNORMAL HIGH (ref 11.5–15.5)
WBC: 15.6 10*3/uL — ABNORMAL HIGH (ref 4.0–10.5)
nRBC: 0 % (ref 0.0–0.2)

## 2018-05-03 LAB — INFLUENZA PANEL BY PCR (TYPE A & B)
Influenza A By PCR: NEGATIVE
Influenza B By PCR: NEGATIVE

## 2018-05-03 LAB — TROPONIN I: Troponin I: 0.03 ng/mL (ref <0.03)

## 2018-05-03 LAB — COMPREHENSIVE METABOLIC PANEL
ALT: 31 U/L (ref 0–44)
AST: 26 U/L (ref 15–41)
Albumin: 3.7 g/dL (ref 3.5–5.0)
Alkaline Phosphatase: 119 U/L (ref 38–126)
Anion gap: 9 (ref 5–15)
BUN: 20 mg/dL (ref 6–20)
CO2: 23 mmol/L (ref 22–32)
Calcium: 8.8 mg/dL — ABNORMAL LOW (ref 8.9–10.3)
Chloride: 102 mmol/L (ref 98–111)
Creatinine, Ser: 1.45 mg/dL — ABNORMAL HIGH (ref 0.61–1.24)
GFR calc Af Amer: >60 mL/min (ref >60)
GFR calc non Af Amer: 53 mL/min — ABNORMAL LOW (ref >60)
GLUCOSE: 105 mg/dL — AB (ref 70–99)
Potassium: 3.7 mmol/L (ref 3.5–5.1)
Sodium: 134 mmol/L — ABNORMAL LOW (ref 135–145)
Total Bilirubin: 0.7 mg/dL (ref 0.3–1.2)
Total Protein: 8.4 g/dL — ABNORMAL HIGH (ref 6.5–8.1)

## 2018-05-03 LAB — MAGNESIUM: Magnesium: 2.3 mg/dL (ref 1.7–2.4)

## 2018-05-03 LAB — TSH: TSH: 3.208 u[IU]/mL (ref 0.350–4.500)

## 2018-05-03 LAB — PROTIME-INR
INR: 0.97
Prothrombin Time: 12.8 seconds (ref 11.4–15.2)

## 2018-05-03 LAB — BRAIN NATRIURETIC PEPTIDE: B Natriuretic Peptide: 248 pg/mL — ABNORMAL HIGH (ref 0.0–100.0)

## 2018-05-03 MED ORDER — BENZONATATE 100 MG PO CAPS
200.0000 mg | ORAL_CAPSULE | Freq: Three times a day (TID) | ORAL | Status: DC | PRN
  Administered 2018-05-04 – 2018-05-05 (×5): 200 mg via ORAL
  Filled 2018-05-03 (×5): qty 2

## 2018-05-03 MED ORDER — AZITHROMYCIN 250 MG PO TABS
500.0000 mg | ORAL_TABLET | Freq: Every day | ORAL | Status: DC
  Administered 2018-05-04 (×2): 500 mg via ORAL
  Filled 2018-05-03: qty 1
  Filled 2018-05-03: qty 2

## 2018-05-03 MED ORDER — DILTIAZEM HCL 25 MG/5ML IV SOLN
10.0000 mg | Freq: Once | INTRAVENOUS | Status: AC
  Administered 2018-05-03: 10 mg via INTRAVENOUS

## 2018-05-03 MED ORDER — GUAIFENESIN-DM 100-10 MG/5ML PO SYRP
5.0000 mL | ORAL_SOLUTION | ORAL | Status: DC | PRN
  Administered 2018-05-04: 5 mL via ORAL
  Filled 2018-05-03 (×3): qty 5

## 2018-05-03 MED ORDER — IPRATROPIUM-ALBUTEROL 0.5-2.5 (3) MG/3ML IN SOLN
3.0000 mL | Freq: Once | RESPIRATORY_TRACT | Status: AC
  Administered 2018-05-03: 3 mL via RESPIRATORY_TRACT

## 2018-05-03 NOTE — ED Provider Notes (Signed)
I have seen and evaluated this patient in conjunction with [facility].  58 year old gentleman with dizziness, now with new onset A. fib.  Here, the patient is hypertensive with a blood pressure in the 150s over low 100s with a heart rate on my exam in the 140s.  He is comfortable and able to breathe without any difficulty.  He will be admitted to the hospitalist for continued evaluation and treatment.   Eula Listen, MD 05/03/18 [phone]

## 2018-05-03 NOTE — ED Provider Notes (Signed)
[facility] ____________________________________________  Time seen: Approximately 7:37 PM  I have reviewed the triage vital signs and the nursing notes.   HISTORY  Chief Complaint Cough   HPI Jon Mendez is a 58 y.o. male presenting for evaluation of 1 week of runny nose, nasal congestion and cough.  Patient states that he initially felt this was only a cold, but reports cough continues and not going away.  States wife is seen in urgent care for similar complaints yesterday and diagnosed with bronchitis and given cough medication and doing better.  States cough is occasionally productive, often dry.  No hemoptysis.  Has had some chills, and states possible fevers at symptom onset, denies fever since.  Denies history of recurrent lung infections or asthma.  Has continued to overall remain active though has felt tired.  Denies chest pain or shortness of breath.  Unresolved with over-the-counter cough and congestion medications.  Denies other aggravating leaving factors.  Denies recent sickness.  Glean Hess, MD: PCP   Past Medical History:  Diagnosis Date  . Diabetes mellitus without complication ([place])   . H/O hidradenitis suppurativa   . H/O varicose veins   . High cholesterol   . Hypertension   . Vertigo     Patient Active Problem List   Diagnosis Date Noted  . Controlled type 2 diabetes mellitus with stage 3 chronic kidney disease, without long-term current use of insulin ([place]) 09/19/2015  . Tubular adenoma of colon   . Chronic renal insufficiency, stage III (moderate) ([place]) 04/25/2015  . Chronic gouty arthritis 11/22/2014  . Hyperlipidemia associated with type 2 diabetes mellitus ([place]) 11/22/2014  . Essential (primary) hypertension 11/22/2014  . Compulsive tobacco user syndrome 11/22/2014  . Varicose veins of both lower extremities 11/22/2014  . Hidradenitis suppurativa 09/06/2012  . Benign paroxysmal positional nystagmus 07/29/2004    Past  Surgical History:  Procedure Laterality Date  . COLONOSCOPY WITH PROPOFOL N/A 06/04/2015   Procedure: COLONOSCOPY WITH PROPOFOL;  Surgeon: Lucilla Lame, MD;  5 tubular adenomas  . HIP ARTHROPLASTY Left 2013  . LEG SKIN LESION  BIOPSY / EXCISION Right 2015   Hidradenitis  . POLYPECTOMY  06/04/2015   Procedure: POLYPECTOMY;  Surgeon: Lucilla Lame, MD;  Location: [place];  Service: Endoscopy;;  . VARICOSE VEIN SURGERY Bilateral 2015      Current Facility-Administered Medications:  .  ipratropium-albuterol (DUONEB) 0.5-2.5 (3) MG/3ML nebulizer solution 3 mL, 3 mL, Nebulization, Once, Marylene Land, NP  Current Outpatient Medications:  .  allopurinol (ZYLOPRIM) 100 MG tablet, TAKE 1 TABLET EVERY DAY, Disp: 30 tablet, Rfl: 12 .  amLODipine (NORVASC) 5 MG tablet, TAKE 1 TABLET BY MOUTH EVERY DAY, Disp: 30 tablet, Rfl: 12 .  glucose blood (FREESTYLE LITE) test strip, Use as instructed, Disp: 100 each, Rfl: 12 .  JANUMET XR [phone] MG TB24, TAKE 1 TABLET BY MOUTH EVERY DAY, Disp: 30 tablet, Rfl: 5 .  Lancets (FREESTYLE) lancets, Use as instructed, Disp: 100 each, Rfl: 12 .  Multiple Vitamin (MULTIVITAMIN) capsule, Take 1 capsule by mouth daily., Disp: , Rfl:  .  pravastatin (PRAVACHOL) 40 MG tablet, TAKE 1 TABLET BY MOUTH AT BEDTIME, Disp: 30 tablet, Rfl: 12 .  valsartan-hydrochlorothiazide (DIOVAN-HCT) 160-25 MG tablet, Take 1 tablet by mouth daily., Disp: 30 tablet, Rfl: 5  Allergies Eggs or egg-derived products and Fish allergy  Family History  Problem Relation Age of Onset  . Hypertension Mother   . Lymphoma Father   . Diabetes Sister   .  Diabetes Brother   . Diabetes Brother     Social History Social History   Tobacco Use  . Smoking status: Current Every Day Smoker    Packs/day: 0.25    Years: 30.00    Pack years: 7.50    Types: Cigarettes  . Smokeless tobacco: Never Used  . Tobacco comment: cutting back- 01/22/2017  Substance Use Topics  . Alcohol use: No     Alcohol/week: 4.0 standard drinks    Types: 4 Standard drinks or equivalent per week  . Drug use: No    Review of Systems Constitutional: Subjective fever. ENT: States her throat only with cough.  Nasal congestion. Cardiovascular: Denies chest pain. Respiratory: Denies shortness of breath. Gastrointestinal: No abdominal pain.   Musculoskeletal: Negative for back pain. Skin: Negative for rash.   ____________________________________________   PHYSICAL EXAM:  VITAL SIGNS: ED Triage Vitals  Enc Vitals Group     BP 05/03/18 1833 (!) 156/104     Pulse Rate 05/03/18 1833 65     Resp 05/03/18 1833 18     Temp 05/03/18 1833 98.4 F (36.9 C)     Temp Source 05/03/18 1833 Oral     SpO2 05/03/18 1833 96 %     Weight 05/03/18 1832 270 lb (122.5 kg)     Height 05/03/18 1832 5\' 11"  (1.803 m)     Head Circumference --      Peak Flow --      Pain Score 05/03/18 1832 0     Pain Loc --      Pain Edu? --      Excl. in [place]? --    Constitutional: Alert and oriented. Well appearing and in no acute distress. Eyes: Conjunctivae are normal. Head: Atraumatic. No sinus tenderness to palpation. No swelling. No erythema.  Ears: no erythema, normal TMs bilaterally.   Nose:Nasal congestion   Mouth/Throat: Mucous membranes are moist. No pharyngeal erythema. No tonsillar swelling or exudate.  Neck: No stridor.  No cervical spine tenderness to palpation. Hematological/Lymphatic/Immunilogical: No cervical lymphadenopathy. Cardiovascular: Irregularly irregular rhythm.  Good peripheral circulation. Respiratory: Normal respiratory effort.  No retractions.  Coarse scattered rhonchi, increased bilateral base.  Mild scattered expiratory wheeze.  Intermittent cough. Gastrointestinal: Soft and nontender. Musculoskeletal: Ambulatory with steady gait.  Neurologic:  Normal speech and language. No gait instability. Skin:  Skin appears warm, dry and intact. No rash noted. Psychiatric: Mood and affect are normal.  Speech and behavior are normal. ___________________________________________   LABS (all labs ordered are listed, but only abnormal results are displayed)  Labs Reviewed - No data to display ____________________________________________  EKG  ED ECG REPORT I, Marylene Land, the attending provider, personally viewed and interpreted this ECG.   Date: 05/03/2018  EKG Time: 1922  Rate: 140  Rhythm: a.fib with rvr  Axis: normal  ST&T Change: none noted  Most recent EKG visible 07/07/2011.  Able to see dictated report from September 05 2017 via care everywhere noting sinus rhythm with premature supraventricular beats, inferior infarct.   RADIOLOGY  No results found. ____________________________________________   PROCEDURES Procedures     INITIAL IMPRESSION / ASSESSMENT AND PLAN / ED COURSE  Pertinent labs & imaging results that were available during my care of the patient were reviewed by me and considered in my medical decision making (see chart for details).  Overall well-appearing patient.  Patient presenting for upper respiratory infection complaints.  However on exam noted to have irregular heart rhythm further evaluated with EKG noted to have  A. fib with RVR.  Patient declines any previous history of this.  Also concern for pneumonia vs sepsis.  At this time recommend further evaluation in emergency room.  Patient to be transported by EMS.  Patient agrees with this plan.  Albuterol DuoNeb given once in urgent care.  Racquel RN called and given report at [facility].   ____________________________________________   FINAL CLINICAL IMPRESSION(S) / ED DIAGNOSES  Final diagnoses:  Cough  Wheezing  Atrial fibrillation with rapid ventricular response ([place])  New onset atrial fibrillation [facility])     ED Discharge Orders    None       Note: This dictation was prepared with Dragon dictation along with smaller phrase technology. Any transcriptional errors that result from  this process are unintentional.         Marylene Land, NP 05/03/18 [id number]

## 2018-05-03 NOTE — ED Triage Notes (Signed)
Patient c/o cough and congestion that started over 1 week ago. Patient has been taking OTC medications for his symptoms with no relief.

## 2018-05-03 NOTE — ED Provider Notes (Signed)
[facility] Emergency Department Provider Note  ____________________________________________   None    (approximate)  I have reviewed the triage vital signs and the nursing notes.   HISTORY  Chief Complaint Atrial Fibrillation   HPI Jon Mendez is a 58 y.o. male who presents to the emergency department for treatment and evaluation of irregular heart rate. He had initially presented to the emergency department for treatment of 1 week of cough and congestion. While there, his he was noted to be tachycardic and subsequently an EKG was performed that shows afib. Patient denies history of the same. He is not followed by cardiology as he has not had any concerning symptoms previously. He denies chest pain or shortness of breath currently.    Past Medical History:  Diagnosis Date  . Diabetes mellitus without complication ([facility])   . H/O hidradenitis suppurativa   . H/O varicose veins   . High cholesterol   . Hypertension   . Vertigo     Patient Active Problem List   Diagnosis Date Noted  . Controlled type 2 diabetes mellitus with stage 3 chronic kidney disease, without long-term current use of insulin ([place]) 09/19/2015  . Tubular adenoma of colon   . Chronic renal insufficiency, stage III (moderate) ([place]) 04/25/2015  . Chronic gouty arthritis 11/22/2014  . Hyperlipidemia associated with type 2 diabetes mellitus ([place]) 11/22/2014  . Essential (primary) hypertension 11/22/2014  . Compulsive tobacco user syndrome 11/22/2014  . Varicose veins of both lower extremities 11/22/2014  . Hidradenitis suppurativa 09/06/2012  . Benign paroxysmal positional nystagmus 07/29/2004    Past Surgical History:  Procedure Laterality Date  . COLONOSCOPY WITH PROPOFOL N/A 06/04/2015   Procedure: COLONOSCOPY WITH PROPOFOL;  Surgeon: Lucilla Lame, MD;  5 tubular adenomas  . HIP ARTHROPLASTY Left 2013  . LEG SKIN LESION  BIOPSY / EXCISION Right 2015   Hidradenitis  .  POLYPECTOMY  06/04/2015   Procedure: POLYPECTOMY;  Surgeon: Lucilla Lame, MD;  Location: [facility];  Service: Endoscopy;;  . VARICOSE VEIN SURGERY Bilateral 2015    Prior to Admission medications   Medication Sig Start Date End Date Taking? Authorizing Provider  allopurinol (ZYLOPRIM) 100 MG tablet TAKE 1 TABLET EVERY DAY 05/14/17   Glean Hess, MD  amLODipine (NORVASC) 5 MG tablet TAKE 1 TABLET BY MOUTH EVERY DAY 05/14/17   Glean Hess, MD  glucose blood (FREESTYLE LITE) test strip Use as instructed 12/24/17   Glean Hess, MD  JANUMET XR [phone] MG TB24 TAKE 1 TABLET BY MOUTH EVERY DAY 05/02/18   Glean Hess, MD  Lancets (FREESTYLE) lancets Use as instructed 12/24/17   Glean Hess, MD  Multiple Vitamin (MULTIVITAMIN) capsule Take 1 capsule by mouth daily.    [provider]  pravastatin (PRAVACHOL) 40 MG tablet TAKE 1 TABLET BY MOUTH AT BEDTIME 05/14/17   Glean Hess, MD  valsartan-hydrochlorothiazide (DIOVAN-HCT) 160-25 MG tablet Take 1 tablet by mouth daily. 02/15/18   Glean Hess, MD    Allergies Eggs or egg-derived products and Fish allergy  Family History  Problem Relation Age of Onset  . Hypertension Mother   . Lymphoma Father   . Diabetes Sister   . Diabetes Brother   . Diabetes Brother     Social History Social History   Tobacco Use  . Smoking status: Current Every Day Smoker    Packs/day: 0.25    Years: 30.00    Pack years: 7.50    Types:  Cigarettes  . Smokeless tobacco: Never Used  . Tobacco comment: cutting back- 01/22/2017  Substance Use Topics  . Alcohol use: No    Alcohol/week: 4.0 standard drinks    Types: 4 Standard drinks or equivalent per week  . Drug use: No    Review of Systems  Constitutional: No fever/chills. Eyes: No visual changes. ENT: No sore throat. Cardiovascular: Negative for chest pain or pressure . Negative for pleuritic pain. Positive for palpitations. Negative for leg  pain. Respiratory: Negative for shortness of breath. Positive for cough. Gastrointestinal: Negative for abdominal pain. Negative for nausea, no vomiting.  No diarrhea.  No constipation. Genitourinary: Negative for dysuria. Musculoskeletal: Negative for back pain.  Skin: Negative for rash, lesion, wound. Neurological: Negative for headaches, focal weakness or numbness. ____________________________________________   PHYSICAL EXAM:  VITAL SIGNS: ED Triage Vitals [05/03/18 2053]  Enc Vitals Group     BP 109/88     Pulse Rate (!) 141     Resp (!) 34     Temp 98.8 F (37.1 C)     Temp Source Oral     SpO2 95 %     Weight 270 lb (122.5 kg)     Height 5\' 11"  (1.803 m)     Head Circumference      Peak Flow      Pain Score 0     Pain Loc      Pain Edu?      Excl. in [place]?     Constitutional: Alert and oriented. Well appearing and in no acute distress. Normal mental status. Eyes: Conjunctivae are normal. PERRL. Head: Atraumatic. Nose: No congestion/rhinnorhea. Mouth/Throat: Mucous membranes are moist.  Oropharynx non-erythematous. Tongue normal in size and color. Neck: No stridor. No carotid bruit appreciated on exam. Hematological/Lymphatic/Immunilogical: No cervical lymphadenopathy. Cardiovascular: Normal rate, regular rhythm. Grossly normal heart sounds.  Good peripheral circulation. Respiratory: Normal respiratory effort.  No retractions. Rhonchi and scattered wheeze that clears with cough. Gastrointestinal: Soft and nontender. No distention. No abdominal bruits. No CVA tenderness. Genitourinary: Exam deferred. Musculoskeletal: No lower extremity tenderness. No edema of extremities. Neurologic:  Normal speech and language. No gross focal neurologic deficits are appreciated. Skin:  Skin is warm, dry and intact. No rash noted. Psychiatric: Mood and affect are normal. Speech and behavior are normal.  ____________________________________________   LABS (all labs ordered are  listed, but only abnormal results are displayed)  Labs Reviewed  CBC WITH DIFFERENTIAL/PLATELET - Abnormal; Notable for the following components:      Result Value   WBC 15.6 (*)    MCV 78.0 (*)    MCH 25.1 (*)    RDW 15.6 (*)    Neutro Abs 10.5 (*)    Monocytes Absolute 1.6 (*)    All other components within normal limits  COMPREHENSIVE METABOLIC PANEL - Abnormal; Notable for the following components:   Sodium 134 (*)    Glucose, Bld 105 (*)    Creatinine, Ser 1.45 (*)    Calcium 8.8 (*)    Total Protein 8.4 (*)    GFR calc non Af Amer 53 (*)    All other components within normal limits  TROPONIN I - Abnormal; Notable for the following components:   Troponin I 0.03 (*)    All other components within normal limits  PROTIME-INR  BRAIN NATRIURETIC PEPTIDE  INFLUENZA PANEL BY PCR (TYPE A & B)  MAGNESIUM  TSH   ____________________________________________  EKG  ED ECG REPORT I, Sherrie George, FNP-BC personally  viewed and interpreted this ECG.   Date: 05/03/2018  EKG Time: 2049  Rate: 142  Rhythm: normal EKG, normal sinus rhythm, unchanged from previous tracings, atrial fibrillation, rate 142 with occasional PVC  Axis: normal  Intervals:none  ST&T Change: No ST elevation  ____________________________________________  RADIOLOGY  ED MD interpretation:  Chest x-ray negative for acute findings per radiology.  Official radiology report(s): Dg Chest 2 View  Result Date: 05/03/2018 CLINICAL DATA:  Cough, congestion EXAM: CHEST - 2 VIEW COMPARISON:  04/28/2011 FINDINGS: Linear atelectasis at the left lung base. Right lung clear. Heart is normal size. No effusions or acute bony abnormality. IMPRESSION: Left base atelectasis. Electronically Signed   By: Rolm Baptise M.D.   On: 05/03/2018 21:27    ____________________________________________   PROCEDURES  Procedure(s) performed: None  Procedures  Critical Care performed: Yes, see critical care  note(s)  ____________________________________________   INITIAL IMPRESSION / ASSESSMENT AND PLAN / ED COURSE  As part of my medical decision making, I reviewed the following data within the electronic MEDICAL RECORD NUMBER Notes from prior ED visits  58 year old male presenting to the emergency department via EMS due to new onset of atrial fibrillation. He is unsure when this may have started. His presenting complaint was cough x 1 week. He has no other symptoms of concern.   ----------------------------------------- 10:37 PM on 05/03/2018 ----------------------------------------- Patient to be admitted for further evaluation, testing, and treatment of new onset atrial fibrillation. While here, he has remained asymptomatic with the exception of occasionally noticing "a palpitation." Chest x-ray is reassuring. Troponin is boarderline at 0.03 and I have no previous for comparison. He is noted to have a mild leukocytosis, but this appears to be chronic. Influenza testing is pending as is the BNP. Dr. Jannifer Franklin has agreed to bring the patient into the hospital for further evaluation and treatment.      ____________________________________________   FINAL CLINICAL IMPRESSION(S) / ED DIAGNOSES  Final diagnoses:  Atrial fibrillation, new onset ([place])  Cough     ED Discharge Orders    None       Note:  This document was prepared using Dragon voice recognition software and may include unintentional dictation errors.    Victorino Dike, FNP 05/03/18 2239    Eula Listen, MD 05/03/18 [phone]

## 2018-05-03 NOTE — ED Triage Notes (Signed)
Pt evaluated at [place] prior to arrival for cough x 1 week. HR elevated, ekg performed pt noted to be in Afib RVR with a rate of 150's- 180's, new onset. Pt denies any SOB or CP. Given 229mL NS bolus pta by EMS.

## 2018-05-03 NOTE — ED Notes (Signed)
Patient transported to X-ray 

## 2018-05-03 NOTE — H&P (Signed)
[facility] NAME: Jon Mendez    MR#:  [id number]  DATE OF BIRTH:  03/23/1960  DATE OF ADMISSION:  05/03/2018  PRIMARY CARE PHYSICIAN: Glean Hess, MD   REQUESTING/REFERRING PHYSICIAN: Vanessa [place], FNP  CHIEF COMPLAINT:   Chief Complaint  Patient presents with  . Atrial Fibrillation    HISTORY OF PRESENT ILLNESS:  Jon Mendez  is a 58 y.o. male who presents with chief complaint as above.  Patient presented to urgent care today to be evaluated for a cough that has been going on for about a week.  While there he was noted to be tachycardic and in A. fib.  This is a new diagnosis for this patient, so he was sent to the ED for evaluation.  He complains of this cough and has had some yellow-greenish sputum production with it.  Work-up in the ED does not show any pneumonia, though does indicate possibility of some CHF with an elevated BNP.  Hospitalist were called for admission and further evaluation  PAST MEDICAL HISTORY:   Past Medical History:  Diagnosis Date  . CKD (chronic kidney disease), stage III ([place])   . Diabetes mellitus without complication ([place])   . H/O hidradenitis suppurativa   . H/O varicose veins   . High cholesterol   . Hypertension   . Vertigo      PAST SURGICAL HISTORY:   Past Surgical History:  Procedure Laterality Date  . COLONOSCOPY WITH PROPOFOL N/A 06/04/2015   Procedure: COLONOSCOPY WITH PROPOFOL;  Surgeon: Lucilla Lame, MD;  5 tubular adenomas  . HIP ARTHROPLASTY Left 2013  . LEG SKIN LESION  BIOPSY / EXCISION Right 2015   Hidradenitis  . POLYPECTOMY  06/04/2015   Procedure: POLYPECTOMY;  Surgeon: Lucilla Lame, MD;  Location: [facility];  Service: Endoscopy;;  . VARICOSE VEIN SURGERY Bilateral 2015     SOCIAL HISTORY:   Social History   Tobacco Use  . Smoking status: Current Every Day Smoker    Packs/day: 0.25    Years: 30.00    Pack years: 7.50    Types:  Cigarettes  . Smokeless tobacco: Never Used  . Tobacco comment: cutting back- 01/22/2017  Substance Use Topics  . Alcohol use: No    Alcohol/week: 4.0 standard drinks    Types: 4 Standard drinks or equivalent per week     FAMILY HISTORY:   Family History  Problem Relation Age of Onset  . Hypertension Mother   . Lymphoma Father   . Diabetes Sister   . Diabetes Brother   . Diabetes Brother      DRUG ALLERGIES:   Allergies  Allergen Reactions  . Eggs Or Egg-Derived Products Shortness Of Breath    Throat swells  . Fish Allergy Shortness Of Breath    Throat swells    MEDICATIONS AT HOME:   Prior to Admission medications   Medication Sig Start Date End Date Taking? Authorizing Provider  allopurinol (ZYLOPRIM) 100 MG tablet TAKE 1 TABLET EVERY DAY Patient taking differently: Take 100 mg by mouth daily.  05/14/17  Yes Glean Hess, MD  amLODipine (NORVASC) 5 MG tablet TAKE 1 TABLET BY MOUTH EVERY DAY Patient taking differently: Take 5 mg by mouth daily.  05/14/17  Yes Glean Hess, MD  JANUMET XR [phone] MG TB24 TAKE 1 TABLET BY MOUTH EVERY DAY Patient taking differently: Take 1 tablet by mouth daily.  05/02/18  Yes Glean Hess, MD  Multiple Vitamin (MULTIVITAMIN) capsule Take 1 capsule by mouth daily.   Yes [provider]  pravastatin (PRAVACHOL) 40 MG tablet TAKE 1 TABLET BY MOUTH AT BEDTIME Patient taking differently: Take 40 mg by mouth at bedtime.  05/14/17  Yes Glean Hess, MD  glucose blood (FREESTYLE LITE) test strip Use as instructed 12/24/17   Glean Hess, MD  Lancets (FREESTYLE) lancets Use as instructed 12/24/17   Glean Hess, MD  valsartan-hydrochlorothiazide (DIOVAN-HCT) 160-25 MG tablet Take 1 tablet by mouth daily. Patient not taking: Reported on 05/03/2018 02/15/18   Glean Hess, MD    REVIEW OF SYSTEMS:  Review of Systems  Constitutional: Negative for chills, fever, malaise/fatigue and weight loss.  HENT:  Negative for ear pain, hearing loss and tinnitus.   Eyes: Negative for blurred vision, double vision, pain and redness.  Respiratory: Positive for cough and sputum production. Negative for hemoptysis and shortness of breath.   Cardiovascular: Negative for chest pain, palpitations, orthopnea and leg swelling.  Gastrointestinal: Negative for abdominal pain, constipation, diarrhea, nausea and vomiting.  Genitourinary: Negative for dysuria, frequency and hematuria.  Musculoskeletal: Negative for back pain, joint pain and neck pain.  Skin:       No acne, rash, or lesions  Neurological: Negative for dizziness, tremors, focal weakness and weakness.  Endo/Heme/Allergies: Negative for polydipsia. Does not bruise/bleed easily.  Psychiatric/Behavioral: Negative for depression. The patient is not nervous/anxious and does not have insomnia.      VITAL SIGNS:   Vitals:   05/03/18 2130 05/03/18 2200 05/03/18 2230 05/03/18 2300  BP: 124/81 (!) 142/90 (!) 162/90 (!) 151/107  Pulse: 77 (!) 49 (!) 124   Resp: (!) 25 12 (!) 32 (!) 31  Temp:      TempSrc:      SpO2: 98% 96% 98%   Weight:      Height:       Wt Readings from Last 3 Encounters:  05/03/18 122.5 kg  05/03/18 122.5 kg  12/24/17 125.6 kg    PHYSICAL EXAMINATION:  Physical Exam  Vitals reviewed. Constitutional: He is oriented to person, place, and time. He appears well-developed and well-nourished. No distress.  HENT:  Head: Normocephalic and atraumatic.  Mouth/Throat: Oropharynx is clear and moist.  Eyes: Pupils are equal, round, and reactive to light. Conjunctivae and EOM are normal. No scleral icterus.  Neck: Normal range of motion. Neck supple. No JVD present. No thyromegaly present.  Cardiovascular: Intact distal pulses. Exam reveals no gallop and no friction rub.  No murmur heard. Tachycardic, irregular rhythm  Respiratory: Effort normal and breath sounds normal. No respiratory distress. He has no wheezes. He has no rales.   GI: Soft. Bowel sounds are normal. He exhibits no distension. There is no abdominal tenderness.  Musculoskeletal: Normal range of motion.        General: No edema.     Comments: No arthritis, no gout  Lymphadenopathy:    He has no cervical adenopathy.  Neurological: He is alert and oriented to person, place, and time. No cranial nerve deficit.  No dysarthria, no aphasia  Skin: Skin is warm and dry. No rash noted. No erythema.  Psychiatric: He has a normal mood and affect. His behavior is normal. Judgment and thought content normal.    LABORATORY PANEL:   CBC Recent Labs  Lab 05/03/18 2110  WBC 15.6*  HGB 13.9  HCT 43.2  PLT 312   ------------------------------------------------------------------------------------------------------------------  Chemistries  Recent Labs  Lab 05/03/18 2110  NA 134*  K 3.7  CL 102  CO2 23  GLUCOSE 105*  BUN 20  CREATININE 1.45*  CALCIUM 8.8*  AST 26  ALT 31  ALKPHOS 119  BILITOT 0.7   ------------------------------------------------------------------------------------------------------------------  Cardiac Enzymes Recent Labs  Lab 05/03/18 2110  TROPONINI 0.03*   ------------------------------------------------------------------------------------------------------------------  RADIOLOGY:  Dg Chest 2 View  Result Date: 05/03/2018 CLINICAL DATA:  Cough, congestion EXAM: CHEST - 2 VIEW COMPARISON:  04/28/2011 FINDINGS: Linear atelectasis at the left lung base. Right lung clear. Heart is normal size. No effusions or acute bony abnormality. IMPRESSION: Left base atelectasis. Electronically Signed   By: Rolm Baptise M.D.   On: 05/03/2018 21:27    EKG:   Orders placed or performed during the hospital encounter of 05/03/18  . EKG 12-Lead  . EKG 12-Lead    IMPRESSION AND PLAN:  Principal Problem:   New onset atrial fibrillation (HCC) -IV diltiazem given in the ED, we will give a second dose if needed, goal is for heart rate  less than 110.  We will order an echocardiogram and a cardiology consult Active Problems:   Bronchitis -POA azithromycin, PRN antitussive   Essential (primary) hypertension -continue home dose antihypertensives   Controlled type 2 diabetes mellitus with stage 3 chronic kidney disease, without long-term current use of insulin (H[facility]) -sliding scale insulin coverage   Hyperlipidemia associated with type 2 diabetes mellitus ([place]) -continue home dose antilipid   Chronic renal insufficiency, stage III (moderate) ([facility]) -at baseline, avoid nephrotoxins and monitor  Chart review performed and case discussed with ED provider. Labs, imaging and/or ECG reviewed by provider and discussed with patient/family. Management plans discussed with the patient and/or family.  DVT PROPHYLAXIS: SubQ lovenox   GI PROPHYLAXIS:  None  ADMISSION STATUS: Inpatient     CODE STATUS: Full  TOTAL TIME TAKING CARE OF THIS PATIENT: 45 minutes.   Ethlyn Daniels 05/03/2018, 11:30 PM  [facility]  Office  [phone]  CC: Primary care physician; Glean Hess, MD  Note:  This document was prepared using Dragon voice recognition software and may include unintentional dictation errors.

## 2018-05-04 ENCOUNTER — Inpatient Hospital Stay (HOSPITAL_COMMUNITY)
Admit: 2018-05-04 | Discharge: 2018-05-04 | Disposition: A | Discharge status: Home / Self Care | Payer: Commercial Managed Care - PPO | Attending: Internal Medicine | Admitting: Internal Medicine

## 2018-05-04 DIAGNOSIS — I129 Hypertensive chronic kidney disease with stage 1 through stage 4 chronic kidney disease, or unspecified chronic kidney disease: Secondary | ICD-10-CM

## 2018-05-04 DIAGNOSIS — E1169 Type 2 diabetes mellitus with other specified complication: Secondary | ICD-10-CM

## 2018-05-04 DIAGNOSIS — I10 Essential (primary) hypertension: Secondary | ICD-10-CM

## 2018-05-04 DIAGNOSIS — J40 Bronchitis, not specified as acute or chronic: Secondary | ICD-10-CM

## 2018-05-04 DIAGNOSIS — N183 Chronic kidney disease, stage 3 (moderate): Secondary | ICD-10-CM

## 2018-05-04 DIAGNOSIS — I4891 Unspecified atrial fibrillation: Secondary | ICD-10-CM

## 2018-05-04 DIAGNOSIS — E785 Hyperlipidemia, unspecified: Secondary | ICD-10-CM

## 2018-05-04 LAB — BASIC METABOLIC PANEL
Anion gap: 9 (ref 5–15)
BUN: 21 mg/dL — ABNORMAL HIGH (ref 6–20)
CHLORIDE: 100 mmol/L (ref 98–111)
CO2: 24 mmol/L (ref 22–32)
Calcium: 8.7 mg/dL — ABNORMAL LOW (ref 8.9–10.3)
Creatinine, Ser: 1.51 mg/dL — ABNORMAL HIGH (ref 0.61–1.24)
GFR calc Af Amer: 58 mL/min — ABNORMAL LOW (ref >60)
GFR calc non Af Amer: 50 mL/min — ABNORMAL LOW (ref >60)
Glucose, Bld: 115 mg/dL — ABNORMAL HIGH (ref 70–99)
POTASSIUM: 3.6 mmol/L (ref 3.5–5.1)
Sodium: 133 mmol/L — ABNORMAL LOW (ref 135–145)

## 2018-05-04 LAB — CBC
HCT: 42.9 % (ref 39.0–52.0)
Hemoglobin: 13.7 g/dL (ref 13.0–17.0)
MCH: 25.2 pg — ABNORMAL LOW (ref 26.0–34.0)
MCHC: 31.9 g/dL (ref 30.0–36.0)
MCV: 79 fL — AB (ref 80.0–100.0)
PLATELETS: 321 10*3/uL (ref 150–400)
RBC: 5.43 MIL/uL (ref 4.22–5.81)
RDW: 15.7 % — ABNORMAL HIGH (ref 11.5–15.5)
WBC: 13.3 10*3/uL — ABNORMAL HIGH (ref 4.0–10.5)
nRBC: 0 % (ref 0.0–0.2)

## 2018-05-04 LAB — GLUCOSE, CAPILLARY
GLUCOSE-CAPILLARY: 98 mg/dL (ref 70–99)
Glucose-Capillary: 97 mg/dL (ref 70–99)
Glucose-Capillary: 193 mg/dL — ABNORMAL HIGH (ref 70–99)
Glucose-Capillary: 99 mg/dL (ref 70–99)

## 2018-05-04 LAB — TROPONIN I
Troponin I: <0.03 ng/mL (ref <0.03)
Troponin I: <0.03 ng/mL (ref <0.03)
Troponin I: 0.03 ng/mL (ref <0.03)

## 2018-05-04 LAB — ECHOCARDIOGRAM COMPLETE
Height: 71 in
Weight: 4380.8 oz

## 2018-05-04 MED ORDER — POTASSIUM CHLORIDE CRYS ER 10 MEQ PO TBCR: 10.0000 meq | EXTENDED_RELEASE_TABLET | Freq: Two times a day (BID) | ORAL | Status: DC

## 2018-05-04 MED ORDER — ONDANSETRON HCL 4 MG/2ML IJ SOLN: 4.0000 mg | Freq: Four times a day (QID) | INTRAMUSCULAR | Status: DC | PRN

## 2018-05-04 MED ORDER — RIVAROXABAN 20 MG PO TABS: 20.0000 mg | ORAL_TABLET | Freq: Every day | ORAL | Status: DC

## 2018-05-04 MED ORDER — METOPROLOL TARTRATE 25 MG PO TABS
12.5000 mg | ORAL_TABLET | Freq: Once | ORAL | Status: AC
  Administered 2018-05-04: 12.5 mg via ORAL
  Filled 2018-05-04: qty 1

## 2018-05-04 MED ORDER — ENOXAPARIN SODIUM 40 MG/0.4ML ~~LOC~~ SOLN: 40.0000 mg | SUBCUTANEOUS | Status: DC

## 2018-05-04 MED ORDER — PRAVASTATIN SODIUM 40 MG PO TABS
40.0000 mg | ORAL_TABLET | Freq: Every day | ORAL | Status: DC
  Administered 2018-05-04 – 2018-05-05 (×2): 40 mg via ORAL
  Filled 2018-05-04 (×2): qty 1

## 2018-05-04 MED ORDER — POTASSIUM CHLORIDE CRYS ER 20 MEQ PO TBCR
20.0000 meq | EXTENDED_RELEASE_TABLET | Freq: Two times a day (BID) | ORAL | Status: DC
  Administered 2018-05-04: 20 meq via ORAL
  Filled 2018-05-04: qty 1

## 2018-05-04 MED ORDER — ONDANSETRON HCL 4 MG PO TABS: 4.0000 mg | ORAL_TABLET | Freq: Four times a day (QID) | ORAL | Status: DC | PRN

## 2018-05-04 MED ORDER — RIVAROXABAN 20 MG PO TABS
20.0000 mg | ORAL_TABLET | Freq: Every day | ORAL | Status: DC
  Administered 2018-05-04 – 2018-05-05 (×2): 20 mg via ORAL
  Filled 2018-05-04 (×2): qty 1

## 2018-05-04 MED ORDER — ACETAMINOPHEN 325 MG PO TABS: 650.0000 mg | ORAL_TABLET | Freq: Four times a day (QID) | ORAL | Status: DC | PRN

## 2018-05-04 MED ORDER — APIXABAN 5 MG PO TABS
5.0000 mg | ORAL_TABLET | Freq: Two times a day (BID) | ORAL | Status: DC
  Administered 2018-05-04: 5 mg via ORAL
  Filled 2018-05-04: qty 1

## 2018-05-04 MED ORDER — ACETAMINOPHEN 650 MG RE SUPP: 650.0000 mg | Freq: Four times a day (QID) | RECTAL | Status: DC | PRN

## 2018-05-04 MED ORDER — METOPROLOL TARTRATE 25 MG PO TABS: 25.0000 mg | ORAL_TABLET | Freq: Two times a day (BID) | ORAL | Status: DC

## 2018-05-04 MED ORDER — METOPROLOL TARTRATE 25 MG PO TABS
12.5000 mg | ORAL_TABLET | Freq: Two times a day (BID) | ORAL | Status: DC
  Administered 2018-05-04: 12.5 mg via ORAL
  Filled 2018-05-04 (×2): qty 1

## 2018-05-04 MED ORDER — POTASSIUM CHLORIDE CRYS ER 20 MEQ PO TBCR
20.0000 meq | EXTENDED_RELEASE_TABLET | Freq: Two times a day (BID) | ORAL | Status: AC
  Administered 2018-05-04: 20 meq via ORAL
  Filled 2018-05-04: qty 1

## 2018-05-04 MED ORDER — NICOTINE 21 MG/24HR TD PT24
21.0000 mg | MEDICATED_PATCH | Freq: Every day | TRANSDERMAL | Status: DC
  Filled 2018-05-04: qty 1

## 2018-05-04 MED ORDER — METOPROLOL SUCCINATE ER 50 MG PO TB24
50.0000 mg | ORAL_TABLET | Freq: Every day | ORAL | Status: DC
  Administered 2018-05-04: 50 mg via ORAL
  Filled 2018-05-04: qty 1

## 2018-05-04 MED ORDER — DILTIAZEM HCL 25 MG/5ML IV SOLN
10.0000 mg | Freq: Once | INTRAVENOUS | Status: AC
  Administered 2018-05-04: 10 mg via INTRAVENOUS
  Filled 2018-05-04: qty 5

## 2018-05-04 MED ORDER — INSULIN ASPART 100 UNIT/ML ~~LOC~~ SOLN
0.0000 [IU] | Freq: Three times a day (TID) | SUBCUTANEOUS | Status: DC
  Administered 2018-05-04: 2 [IU] via SUBCUTANEOUS
  Administered 2018-05-05 (×2): 1 [IU] via SUBCUTANEOUS
  Filled 2018-05-04 (×3): qty 1

## 2018-05-04 MED ORDER — HYDROCOD POLST-CPM POLST ER 10-8 MG/5ML PO SUER
5.0000 mL | Freq: Two times a day (BID) | ORAL | Status: DC | PRN
  Administered 2018-05-04 – 2018-05-06 (×5): 5 mL via ORAL
  Filled 2018-05-04 (×5): qty 5

## 2018-05-04 NOTE — Progress Notes (Signed)
*  PRELIMINARY RESULTS* Echocardiogram 2D Echocardiogram has been performed.  Jon Mendez 05/04/2018, 12:11 PM

## 2018-05-04 NOTE — Progress Notes (Signed)
Advanced care plan.  Purpose of the Encounter: CODE STATUS  Parties in Attendance: Patient  Patient's Decision Capacity: Good  Subjective/Patient's story: Presented with palpitations and rapid heart rate   Objective/Medical story Patient has atrial fibrillation with rapid rate Needs rate control and anticoagulation Needs cardiology evaluation and work-up   Goals of care determination:  Advance care directives goals of care and treatment plan discussed Patient wants everything done which includes CPR, intubation and ventilator if the need arises   CODE STATUS: Full code   Time spent discussing advanced care planning: 16 minutes

## 2018-05-04 NOTE — Progress Notes (Signed)
[facility] [place] NAME: Jon Mendez    MR#:  [id number]  DATE OF BIRTH:  03/23/1960  SUBJECTIVE:  CHIEF COMPLAINT:   Chief Complaint  Patient presents with  . Atrial Fibrillation  Patient seen and evaluated today Has some palpitations No complaints of chest pain No shortness of breath  REVIEW OF SYSTEMS:    ROS  CONSTITUTIONAL: No documented fever. No fatigue, weakness. No weight gain, no weight loss.  EYES: No blurry or double vision.  ENT: No tinnitus. No postnasal drip. No redness of the oropharynx.  RESPIRATORY: No cough, no wheeze, no hemoptysis. No dyspnea.  CARDIOVASCULAR: No chest pain. No orthopnea. Has palpitations. No syncope.  GASTROINTESTINAL: No nausea, no vomiting or diarrhea. No abdominal pain. No melena or hematochezia.  GENITOURINARY: No dysuria or hematuria.  ENDOCRINE: No polyuria or nocturia. No heat or cold intolerance.  HEMATOLOGY: No anemia. No bruising. No bleeding.  INTEGUMENTARY: No rashes. No lesions.  MUSCULOSKELETAL: No arthritis. No swelling. No gout.  NEUROLOGIC: No numbness, tingling, or ataxia. No seizure-type activity.  Lightheadedness PSYCHIATRIC: No anxiety. No insomnia. No ADD.   DRUG ALLERGIES:   Allergies  Allergen Reactions  . Eggs Or Egg-Derived Products Shortness Of Breath    Throat swells  . Fish Allergy Shortness Of Breath    Throat swells    VITALS:  Blood pressure 123/86, pulse (!) 102, temperature 98.1 F (36.7 C), temperature source Oral, resp. rate 20, height 5\' 11"  (1.803 m), weight 124.2 kg, SpO2 96 %.  PHYSICAL EXAMINATION:   Physical Exam  GENERAL:  57 y.o.-year-old patient lying in the bed with no acute distress.  EYES: Pupils equal, round, reactive to light and accommodation. No scleral icterus. Extraocular muscles intact.  HEENT: Head atraumatic, normocephalic. Oropharynx and nasopharynx clear.  NECK:  Supple, no jugular venous distention. No thyroid  enlargement, no tenderness.  LUNGS: Normal breath sounds bilaterally, no wheezing, rales, rhonchi. No use of accessory muscles of respiration.  CARDIOVASCULAR: S1, S2 irregular. No murmurs, rubs, or gallops.  ABDOMEN: Soft, nontender, nondistended. Bowel sounds present. No organomegaly or mass.  EXTREMITIES: No cyanosis, clubbing or edema b/l.    NEUROLOGIC: Cranial nerves II through XII are intact. No focal Motor or sensory deficits b/l.   PSYCHIATRIC: The patient is alert and oriented x 3.  SKIN: No obvious rash, lesion, or ulcer.   LABORATORY PANEL:   CBC Recent Labs  Lab 05/04/18 0200  WBC 13.3*  HGB 13.7  HCT 42.9  PLT 321   ------------------------------------------------------------------------------------------------------------------ Chemistries  Recent Labs  Lab 05/03/18 2110 05/04/18 0200  NA 134* 133*  K 3.7 3.6  CL 102 100  CO2 23 24  GLUCOSE 105* 115*  BUN 20 21*  CREATININE 1.45* 1.51*  CALCIUM 8.8* 8.7*  MG 2.3  --   AST 26  --   ALT 31  --   ALKPHOS 119  --   BILITOT 0.7  --    ------------------------------------------------------------------------------------------------------------------  Cardiac Enzymes Recent Labs  Lab 05/04/18 0750  TROPONINI <0.03   ------------------------------------------------------------------------------------------------------------------  RADIOLOGY:  Dg Chest 2 View  Result Date: 05/03/2018 CLINICAL DATA:  Cough, congestion EXAM: CHEST - 2 VIEW COMPARISON:  04/28/2011 FINDINGS: Linear atelectasis at the left lung base. Right lung clear. Heart is normal size. No effusions or acute bony abnormality. IMPRESSION: Left base atelectasis. Electronically Signed   By: Rolm Baptise M.D.   On: 05/03/2018 21:27     ASSESSMENT AND PLAN:   58 year old  male patient with history of atrial fibrillation, hypertension, type 2 diabetes mellitus, hyperlipidemia, chronic kidney disease stage III currently under hospitalist service  for atrial fibrillation with rapid rate  -Atrial fibrillation with rapid rate Start anticoagulation with oral Eliquis 5 mg twice daily Continue metoprolol 25 mg orally twice daily for rate control Status post cardiology evaluation Monitor electrolytes No cardioversion recommended at this time  -Hypertension Continue metoprolol for blood pressure control Add valsartan HCTZ Follow-up echocardiogram  -Hyperlipidemia Statin therapy Lifestyle modification  -Type 2 diabetes mellitus Diabetic diet with sliding scale coverage with insulin  -DVT prophylaxis Oral Eliquis for anticoagulation  -Tobacco abuse Tobacco cessation counseled to the patient for 6 minutes Nicotine patch offered  All the records are reviewed and case discussed with Care Management/Social Worker. Management plans discussed with the patient, family and they are in agreement.  CODE STATUS: Full code  DVT Prophylaxis: SCDs  TOTAL TIME TAKING CARE OF THIS PATIENT: 37 minutes.   POSSIBLE D/C IN 2 to 3 DAYS, DEPENDING ON CLINICAL CONDITION.  Saundra Shelling M.D on 05/04/2018 at 12:10 PM  Between 7am to 6pm - Pager - [phone]  After 6pm go to [email or web address] - password [email or web address] [facility]  [facility]  Office  [phone]  CC: Primary care physician; Glean Hess, MD  Note: This dictation was prepared with Dragon dictation along with smaller phrase technology. Any transcriptional errors that result from this process are unintentional.

## 2018-05-04 NOTE — Consult Note (Signed)
Cardiology Consultation:   Patient ID: Jon Mendez MRN: [id number]; DOB: 03/23/1960  Admit date: 05/03/2018 Date of Consult: 05/04/2018  Primary Care Provider: Glean Hess, MD Primary Cardiologist: No primary care provider on file.  Primary Electrophysiologist:  None    Patient Profile:   Jon Mendez is a 58 y.o. male with a hx of HTN, DM2, HLD, CKD3 and palpitations who is being seen today for the evaluation of new onset Afib with RVR at the request of Dr. Jannifer Franklin.  History of Present Illness:   Jon Mendez is  A 58 yo male with PMH as above and no known cardiac history.  No reported family history of atrial fibrillation of cardiac disease.  PTA medications included allopurinol, amlodipine, valsartan-HCTZ, and junumet.  Previously seen by [facility] for palpitations and intermittent episodes of feeling his heart race. He also reported associated lightheadedness and diaphoresis, but he denied chest pain. He stated that he had been drinking energy drinks, including mountain dew, recently and while at work. His wife stated at that time her feelings that his symptoms were due to this beverage choice. He also stated he felt his symptoms were made worse by working in the hot environment of his [employer].   Since Sunday of last week, he has reportedly had a cough that he attributed to bronchitis. Associated symptoms included fatigue and weakness but no reported chest pain. His wife also reportedly had a cough with bronchitis, which he thought he had caught from her. He presented to urgent care after no relief from OTC medications. At that time, he was noted to be in Afib with RVR with ventricular rate of 150bpm.   In the ED, he was started on IV diltiazem. Labs significant for TSH 3.208, magnesium 2.3, BNP 248, troponin 0 0.03, potassium 3.7, BUN 20, creatinine 1.45, WBC 15.6.  CXR without acute cardiopulmonary disease.  EKG showed A. fib with RVR at 142 bpm  ventricular rate.   Past Medical History:  Diagnosis Date  . CKD (chronic kidney disease), stage III ([place])   . Diabetes mellitus without complication ([place])   . H/O hidradenitis suppurativa   . H/O varicose veins   . High cholesterol   . Hypertension   . Vertigo     Past Surgical History:  Procedure Laterality Date  . COLONOSCOPY WITH PROPOFOL N/A 06/04/2015   Procedure: COLONOSCOPY WITH PROPOFOL;  Surgeon: Lucilla Lame, MD;  5 tubular adenomas  . HIP ARTHROPLASTY Left 2013  . LEG SKIN LESION  BIOPSY / EXCISION Right 2015   Hidradenitis  . POLYPECTOMY  06/04/2015   Procedure: POLYPECTOMY;  Surgeon: Lucilla Lame, MD;  Location: [facility];  Service: Endoscopy;;  . VARICOSE VEIN SURGERY Bilateral 2015     Home Medications:  Prior to Admission medications   Medication Sig Start Date End Date Taking? Authorizing Provider  allopurinol (ZYLOPRIM) 100 MG tablet TAKE 1 TABLET EVERY DAY Patient taking differently: Take 100 mg by mouth daily.  05/14/17  Yes Glean Hess, MD  amLODipine (NORVASC) 5 MG tablet TAKE 1 TABLET BY MOUTH EVERY DAY Patient taking differently: Take 5 mg by mouth daily.  05/14/17  Yes Glean Hess, MD  JANUMET XR [phone] MG TB24 TAKE 1 TABLET BY MOUTH EVERY DAY Patient taking differently: Take 1 tablet by mouth daily.  05/02/18  Yes Glean Hess, MD  Multiple Vitamin (MULTIVITAMIN) capsule Take 1 capsule by mouth daily.   Yes [provider]  pravastatin (PRAVACHOL) 40  MG tablet TAKE 1 TABLET BY MOUTH AT BEDTIME Patient taking differently: Take 40 mg by mouth at bedtime.  05/14/17  Yes Glean Hess, MD  glucose blood (FREESTYLE LITE) test strip Use as instructed 12/24/17   Glean Hess, MD  Lancets (FREESTYLE) lancets Use as instructed 12/24/17   Glean Hess, MD  valsartan-hydrochlorothiazide (DIOVAN-HCT) 160-25 MG tablet Take 1 tablet by mouth daily. Patient not taking: Reported on 05/03/2018 02/15/18   Glean Hess, MD     Inpatient Medications: Scheduled Meds: . azithromycin  500 mg Oral Daily  . enoxaparin (LOVENOX) injection  40 mg Subcutaneous Q24H  . insulin aspart  0-9 Units Subcutaneous TID WC  . pravastatin  40 mg Oral QHS   Continuous Infusions:  PRN Meds: acetaminophen **OR** acetaminophen, benzonatate, chlorpheniramine-HYDROcodone, ondansetron **OR** ondansetron (ZOFRAN) IV  Allergies:    Allergies  Allergen Reactions  . Eggs Or Egg-Derived Products Shortness Of Breath    Throat swells  . Fish Allergy Shortness Of Breath    Throat swells    Social History:   Social History   Socioeconomic History  . Marital status: Married    Spouse name: Not on file  . Number of children: Not on file  . Years of education: Not on file  . Highest education level: Not on file  Occupational History  . Not on file  Social Needs  . Financial resource strain: Not on file  . Food insecurity:    Worry: Not on file    Inability: Not on file  . Transportation needs:    Medical: Not on file    Non-medical: Not on file  Tobacco Use  . Smoking status: Current Every Day Smoker    Packs/day: 0.25    Years: 30.00    Pack years: 7.50    Types: Cigarettes  . Smokeless tobacco: Never Used  . Tobacco comment: cutting back- 01/22/2017  Substance and Sexual Activity  . Alcohol use: No    Alcohol/week: 4.0 standard drinks    Types: 4 Standard drinks or equivalent per week  . Drug use: No  . Sexual activity: Not on file  Lifestyle  . Physical activity:    Days per week: Not on file    Minutes per session: Not on file  . Stress: Not on file  Relationships  . Social connections:    Talks on phone: Not on file    Gets together: Not on file    Attends religious service: Not on file    Active member of club or organization: Not on file    Attends meetings of clubs or organizations: Not on file    Relationship status: Not on file  . Intimate partner violence:    Fear of current or ex partner: Not  on file    Emotionally abused: Not on file    Physically abused: Not on file    Forced sexual activity: Not on file  Other Topics Concern  . Not on file  Social History Narrative  . Not on file    Family History:    Family History  Problem Relation Age of Onset  . Hypertension Mother   . Lymphoma Father   . Diabetes Sister   . Diabetes Brother   . Diabetes Brother      ROS:  Please see the history of present illness.   Review of Systems  Constitutional: Positive for malaise/fatigue.  Respiratory: Positive for cough.   Cardiovascular: Negative for chest pain, palpitations,  orthopnea and leg swelling.  Gastrointestinal: Negative for melena, nausea and vomiting.  Neurological: Positive for weakness. Negative for dizziness, seizures and loss of consciousness.  Psychiatric/Behavioral: Negative for substance abuse.  All other systems reviewed and are negative.   All other ROS reviewed and negative.     Physical Exam/Data:   Vitals:   05/04/18 0044 05/04/18 0319 05/04/18 0345 05/04/18 0723  BP: 137/82 117/84  123/86  Pulse: (!) 112 (!) 124 (!) 116 (!) 102  Resp: 20 20    Temp: 98.2 F (36.8 C)   98.1 F (36.7 C)  TempSrc: Oral   Oral  SpO2: 98% 98%  96%  Weight: 124.2 kg     Height:        Intake/Output Summary (Last 24 hours) at 05/04/2018 0756 Last data filed at 05/04/2018 0316 Gross per 24 hour  Intake -  Output 0 ml  Net 0 ml   Filed Weights   05/03/18 2053 05/04/18 0044  Weight: 122.5 kg 124.2 kg   Body mass index is 38.19 kg/m.  General:  Well nourished, well developed, in no acute distress. Accompanied by wife HEENT: normal Neck: no JVD Vascular: No carotid bruits; radial pulses 2+ bilaterally without bruits  Cardiac:  IRIR; no murmur  Lungs:  clear to auscultation bilaterally, reduced breath sounds left side, no wheezing, rhonchi or rales  Abd: soft, nontender, no hepatomegaly   Ext: no b/l LEE  Musculoskeletal:  No deformities, BUE and BLE  strength normal and equal Skin: warm and dry  Neuro:  no focal abnormalities noted Psych:  Normal affect   EKG: Refer to HPI Telemetry:  Telemetry was personally reviewed and demonstrates:  IRIR, ventricular rates 120s-140s, PVCs  CV Studies:   Relevant CV Studies:  Pending echo  Laboratory Data:  Chemistry Recent Labs  Lab 05/03/18 2110 05/04/18 0200  NA 134* 133*  K 3.7 3.6  CL 102 100  CO2 23 24  GLUCOSE 105* 115*  BUN 20 21*  CREATININE 1.45* 1.51*  CALCIUM 8.8* 8.7*  GFRNONAA 53* 50*  GFRAA >60 58*  ANIONGAP 9 9    Recent Labs  Lab 05/03/18 2110  PROT 8.4*  ALBUMIN 3.7  AST 26  ALT 31  ALKPHOS 119  BILITOT 0.7   Hematology Recent Labs  Lab 05/03/18 2110 05/04/18 0200  WBC 15.6* 13.3*  RBC 5.54 5.43  HGB 13.9 13.7  HCT 43.2 42.9  MCV 78.0* 79.0*  MCH 25.1* 25.2*  MCHC 32.2 31.9  RDW 15.6* 15.7*  PLT 312 321   Cardiac Enzymes Recent Labs  Lab 05/03/18 2110 05/04/18 0200  TROPONINI 0.03* <0.03   No results for input(s): TROPIPOC in the last 168 hours.  BNP Recent Labs  Lab 05/03/18 2110  BNP 248.0*    DDimer No results for input(s): DDIMER in the last 168 hours.  Radiology/Studies:  Dg Chest 2 View  Result Date: 05/03/2018 CLINICAL DATA:  Cough, congestion EXAM: CHEST - 2 VIEW COMPARISON:  04/28/2011 FINDINGS: Linear atelectasis at the left lung base. Right lung clear. Heart is normal size. No effusions or acute bony abnormality. IMPRESSION: Left base atelectasis. Electronically Signed   By: Rolm Baptise M.D.   On: 05/03/2018 21:27    Assessment and Plan:    Afib with RVR - Possibly symptomatic with cough and fatigue. Previous documentation as above in HPI indicates also has had palpitations and feelings of racing HR in the past with acutal onset of Afib with RVR unclear and cannot guarantee  onset in last 24h-48h.  - CHA2DS2VASc score of at least 2 (HTN, DM2). Recommendation for anticoagulation. Started on Eliquis 5mg  BID. Daily CBC  recommended.  - Rate control recommended and started with Metoprolol tartrate 12.5mg  x1, now increased to 25mg  BID. Continue to monitor HR and BP and uptitrate as BP allows. Recommend transition/consolidation of this medication to Toprol XL prior to discharge. - Labs checked: TSH 2.020, A1C update pending. Daily CBC/BMET recommended. K 3.7. Started on KCl repletion with KCl tab 37mEq X1 this AM and 31mEq x1 in PM. Mg 2.3.  Cr 1.51 with baseline Cr 1.4-1.7. - Pending echo.  - Recommend outpatient follow-up in our office including outpatient labs. Once patient is therapeutically anticoagulated (after 1 month of anticoagulation), could consider DCCV pending results of echo and if no LAE that would prevent holding SR. No cardioversion recommended at this time given not therapeutically anticoagulated. For now, plan to medically manage with rate control and anticoagulation.   HTN - BP borderline at times but controlled today. Continue to monitor BP with increase in metoprolol as above for rate control and reduce dosage if significant hypotension. - Considering patient's DM and HTN, recommend restart PTA combination ARB (valsartan-HCTZ) prior to discharge with follow-up labs in the office and as long as kidney function and potassium stable. Pending echo to assess LVEF  PTA amlodipine on hold for HTN. Given starting patient on metoprolol for rate control, consider discontinuing the amlodipine at discharge.   HLD - Continue statin therapy with pravastatin 40mg  daily. Considering patient risk factors, would benefit from further ischemic workup as an outpatient in the future. Would also recommend rechecking LDL in the future.   CKD3 - Continue to monitor renal function and electrolytes with daily CBC  DM2 - Pending updated A1C - SSI. PTA medication on hold. Per IM  For questions or updates, please contact [place] Please consult [email or web address] for contact info under     Signed, Arvil Chaco, PA-C  05/04/2018 7:56 AM

## 2018-05-04 NOTE — ED Notes (Signed)
Pt off the unit at this time with cardiac monitoring, taken via wheelchair by NA.

## 2018-05-05 LAB — CBC WITH DIFFERENTIAL/PLATELET
Abs Immature Granulocytes: 0.16 10*3/uL — ABNORMAL HIGH (ref 0.00–0.07)
BASOS PCT: 1 %
Basophils Absolute: 0.1 10*3/uL (ref 0.0–0.1)
EOS ABS: 0.4 10*3/uL (ref 0.0–0.5)
EOS PCT: 3 %
HCT: 45.7 % (ref 39.0–52.0)
Hemoglobin: 14.9 g/dL (ref 13.0–17.0)
Immature Granulocytes: 1 %
Lymphocytes Relative: 23 %
Lymphs Abs: 3.4 10*3/uL (ref 0.7–4.0)
MCH: 25.8 pg — ABNORMAL LOW (ref 26.0–34.0)
MCHC: 32.6 g/dL (ref 30.0–36.0)
MCV: 79.2 fL — ABNORMAL LOW (ref 80.0–100.0)
Monocytes Absolute: 1.5 10*3/uL — ABNORMAL HIGH (ref 0.1–1.0)
Monocytes Relative: 10 %
Neutro Abs: 9.1 10*3/uL — ABNORMAL HIGH (ref 1.7–7.7)
Neutrophils Relative %: 62 %
Platelets: 338 10*3/uL (ref 150–400)
RBC: 5.77 MIL/uL (ref 4.22–5.81)
RDW: 15.8 % — ABNORMAL HIGH (ref 11.5–15.5)
WBC: 14.5 10*3/uL — ABNORMAL HIGH (ref 4.0–10.5)
nRBC: 0 % (ref 0.0–0.2)

## 2018-05-05 LAB — BASIC METABOLIC PANEL
Anion gap: 8 (ref 5–15)
BUN: 21 mg/dL — ABNORMAL HIGH (ref 6–20)
CO2: 23 mmol/L (ref 22–32)
CREATININE: 1.37 mg/dL — AB (ref 0.61–1.24)
Calcium: 9.2 mg/dL (ref 8.9–10.3)
Chloride: 101 mmol/L (ref 98–111)
GFR calc Af Amer: >60 mL/min (ref >60)
GFR calc non Af Amer: 56 mL/min — ABNORMAL LOW (ref >60)
Glucose, Bld: 101 mg/dL — ABNORMAL HIGH (ref 70–99)
Potassium: 4.4 mmol/L (ref 3.5–5.1)
Sodium: 132 mmol/L — ABNORMAL LOW (ref 135–145)

## 2018-05-05 LAB — HEMOGLOBIN A1C
Hgb A1c MFr Bld: 6.5 % — ABNORMAL HIGH (ref 4.8–5.6)
MEAN PLASMA GLUCOSE: 139.85 mg/dL

## 2018-05-05 LAB — GLUCOSE, CAPILLARY
Glucose-Capillary: 104 mg/dL — ABNORMAL HIGH (ref 70–99)
Glucose-Capillary: 137 mg/dL — ABNORMAL HIGH (ref 70–99)
Glucose-Capillary: 130 mg/dL — ABNORMAL HIGH (ref 70–99)
Glucose-Capillary: 122 mg/dL — ABNORMAL HIGH (ref 70–99)

## 2018-05-05 LAB — HIV ANTIBODY (ROUTINE TESTING W REFLEX): HIV Screen 4th Generation wRfx: NONREACTIVE

## 2018-05-05 MED ORDER — METOPROLOL SUCCINATE ER 100 MG PO TB24
100.0000 mg | ORAL_TABLET | Freq: Every day | ORAL | Status: DC
  Administered 2018-05-05 – 2018-05-06 (×2): 100 mg via ORAL
  Filled 2018-05-05 (×2): qty 1

## 2018-05-05 MED ORDER — DIGOXIN 250 MCG PO TABS
0.2500 mg | ORAL_TABLET | Freq: Every day | ORAL | Status: DC
  Administered 2018-05-05 – 2018-05-06 (×2): 0.25 mg via ORAL
  Filled 2018-05-05 (×2): qty 1

## 2018-05-05 MED ORDER — SODIUM CHLORIDE 0.9 % IV SOLN
500.0000 mg | INTRAVENOUS | Status: DC
  Administered 2018-05-05 – 2018-05-06 (×2): 500 mg via INTRAVENOUS
  Filled 2018-05-05 (×2): qty 500

## 2018-05-05 MED ORDER — DIGOXIN 250 MCG PO TABS
0.2500 mg | ORAL_TABLET | Freq: Every day | ORAL | Status: DC
  Filled 2018-05-05: qty 1

## 2018-05-05 MED ORDER — AMOXICILLIN-POT CLAVULANATE 875-125 MG PO TABS: 1.0000 | ORAL_TABLET | Freq: Two times a day (BID) | ORAL | Status: DC

## 2018-05-05 MED ORDER — METOPROLOL SUCCINATE ER 50 MG PO TB24: 50.0000 mg | ORAL_TABLET | Freq: Once | ORAL | Status: DC

## 2018-05-05 MED ORDER — SODIUM CHLORIDE 0.9 % IV SOLN
1.0000 g | INTRAVENOUS | Status: DC
  Administered 2018-05-05 – 2018-05-06 (×2): 1 g via INTRAVENOUS
  Filled 2018-05-05 (×2): qty 10

## 2018-05-05 MED ORDER — METOPROLOL SUCCINATE ER 100 MG PO TB24: 100.0000 mg | ORAL_TABLET | Freq: Every day | ORAL | Status: DC

## 2018-05-05 NOTE — Progress Notes (Signed)
[facility] at [place] NAME: Jon Mendez    MR#:  [id number]  DATE OF BIRTH:  03/23/1960  SUBJECTIVE:  CHIEF COMPLAINT:   Chief Complaint  Patient presents with  . Atrial Fibrillation  Patient seen and evaluated today Has some palpitations Has cough productive of phlegm No complaints of chest pain mild shortness of breath  REVIEW OF SYSTEMS:    ROS  CONSTITUTIONAL: No documented fever. No fatigue, weakness. No weight gain, no weight loss.  EYES: No blurry or double vision.  ENT: No tinnitus. No postnasal drip. No redness of the oropharynx.  RESPIRATORY: Has cough, no wheeze, no hemoptysis. mild dyspnea.  CARDIOVASCULAR: No chest pain. No orthopnea. Has palpitations. No syncope.  GASTROINTESTINAL: No nausea, no vomiting or diarrhea. No abdominal pain. No melena or hematochezia.  GENITOURINARY: No dysuria or hematuria.  ENDOCRINE: No polyuria or nocturia. No heat or cold intolerance.  HEMATOLOGY: No anemia. No bruising. No bleeding.  INTEGUMENTARY: No rashes. No lesions.  MUSCULOSKELETAL: No arthritis. No swelling. No gout.  NEUROLOGIC: No numbness, tingling, or ataxia. No seizure-type activity.  Lightheadedness PSYCHIATRIC: No anxiety. No insomnia. No ADD.   DRUG ALLERGIES:   Allergies  Allergen Reactions  . Eggs Or Egg-Derived Products Shortness Of Breath    Throat swells. Patient able to eat products that contain egg just not whole egg alone.  . Fish Allergy Shortness Of Breath    Throat swells    VITALS:  Blood pressure 116/74, pulse 82, temperature 98.5 F (36.9 C), temperature source Oral, resp. rate 17, height 5\' 11"  (1.803 m), weight 125.4 kg, SpO2 98 %.  PHYSICAL EXAMINATION:   Physical Exam  GENERAL:  58 y.o.-year-old patient lying in the bed with no acute distress.  EYES: Pupils equal, round, reactive to light and accommodation. No scleral icterus. Extraocular muscles intact.  HEENT: Head atraumatic,  normocephalic. Oropharynx and nasopharynx clear.  NECK:  Supple, no jugular venous distention. No thyroid enlargement, no tenderness.  LUNGS: Decreased breath sounds bilaterally, rales heard in left lung. No use of accessory muscles of respiration.  CARDIOVASCULAR: S1, S2 irregular. No murmurs, rubs, or gallops.  ABDOMEN: Soft, nontender, nondistended. Bowel sounds present. No organomegaly or mass.  EXTREMITIES: No cyanosis, clubbing or edema b/l.    NEUROLOGIC: Cranial nerves II through XII are intact. No focal Motor or sensory deficits b/l.   PSYCHIATRIC: The patient is alert and oriented x 3.  SKIN: No obvious rash, lesion, or ulcer.   LABORATORY PANEL:   CBC Recent Labs  Lab 05/05/18 0433  WBC 14.5*  HGB 14.9  HCT 45.7  PLT 338   ------------------------------------------------------------------------------------------------------------------ Chemistries  Recent Labs  Lab 05/03/18 2110  05/05/18 0433  NA 134*   < > 132*  K 3.7   < > 4.4  CL 102   < > 101  CO2 23   < > 23  GLUCOSE 105*   < > 101*  BUN 20   < > 21*  CREATININE 1.45*   < > 1.37*  CALCIUM 8.8*   < > 9.2  MG 2.3  --   --   AST 26  --   --   ALT 31  --   --   ALKPHOS 119  --   --   BILITOT 0.7  --   --    < > = values in this interval not displayed.   ------------------------------------------------------------------------------------------------------------------  Cardiac Enzymes Recent Labs  Lab 05/04/18 1234  TROPONINI  0.03*   ------------------------------------------------------------------------------------------------------------------  RADIOLOGY:  Dg Chest 2 View  Result Date: 05/03/2018 CLINICAL DATA:  Cough, congestion EXAM: CHEST - 2 VIEW COMPARISON:  04/28/2011 FINDINGS: Linear atelectasis at the left lung base. Right lung clear. Heart is normal size. No effusions or acute bony abnormality. IMPRESSION: Left base atelectasis. Electronically Signed   By: Rolm Baptise M.D.   On:  05/03/2018 21:27     ASSESSMENT AND PLAN:   58 year old male patient with history of atrial fibrillation, hypertension, type 2 diabetes mellitus, hyperlipidemia, chronic kidney disease stage III currently under hospitalist service for atrial fibrillation with rapid rate  -Community-acquired pneumonia Continue IV Rocephin and Zithromax antibiotic Mucolytic's Follow-up WBC count  -Atrial fibrillation with rapid rate Anticoagulation with oral Xarelto Crease metoprolol to 100 mg daily orally Status post cardiology evaluation Monitor electrolytes No cardioversion recommended at this time  -Hypertension Continue metoprolol for blood pressure control Echocardiogram reviewed Systolic function is normal Ejection fraction 50 to 55%  -Hyperlipidemia Statin therapy Lifestyle modification  -Type 2 diabetes mellitus Diabetic diet with sliding scale coverage with insulin  -DVT prophylaxis Oral Xarelto for anticoagulation  -Tobacco abuse Tobacco cessation counseled to the patient for 6 minutes Nicotine patch offered  All the records are reviewed and case discussed with Care Management/Social Worker. Management plans discussed with the patient, family and they are in agreement.  CODE STATUS: Full code  DVT Prophylaxis: SCDs  TOTAL TIME TAKING CARE OF THIS PATIENT: 38 minutes.   POSSIBLE D/C IN 2 to 3 DAYS, DEPENDING ON CLINICAL CONDITION.  Saundra Shelling M.D on 05/05/2018 at 1:23 PM  Between 7am to 6pm - Pager - [phone]  After 6pm go to [email or web address] - password [email or web address] [facility]  [facility]  Office  [phone]  CC: Primary care physician; Glean Hess, MD  Note: This dictation was prepared with Dragon dictation along with smaller phrase technology. Any transcriptional errors that result from this process are unintentional.

## 2018-05-05 NOTE — Plan of Care (Signed)
  Problem: Clinical Measurements: Goal: Cardiovascular complication will be avoided Outcome: Progressing   Problem: Pain Managment: Goal: General experience of comfort will improve Outcome: Progressing   Problem: Education: Goal: Knowledge of disease or condition will improve Outcome: Progressing Goal: Understanding of medication regimen will improve Outcome: Progressing

## 2018-05-05 NOTE — Progress Notes (Signed)
Progress Note  Patient Name: Jon Mendez Date of Encounter: 05/05/2018  Primary Cardiologist: [facility], Dr. Rockey Situ  Subjective   Patient denies cardiac symptoms of chest pain, palpitations, or racing HR. No sx of pre-syncope. He denied SOB. No reported fatigue. He still reports a cough with yellow/green sputum. He stated they just started him on an additional abx.   He has not yet ambulated in the hall with recommendations regarding this ambulation below. He did report he has been more active today and ambulating between bed, bathroom, and chair.   Inpatient Medications    Scheduled Meds: . insulin aspart  0-9 Units Subcutaneous TID WC  . metoprolol succinate  100 mg Oral Daily  . nicotine  21 mg Transdermal Daily  . pravastatin  40 mg Oral QHS  . rivaroxaban  20 mg Oral Daily   Continuous Infusions: . azithromycin    . cefTRIAXone (ROCEPHIN)  IV 1 g (05/05/18 1223)   PRN Meds: acetaminophen **OR** acetaminophen, benzonatate, chlorpheniramine-HYDROcodone, ondansetron **OR** ondansetron (ZOFRAN) IV   Vital Signs    Vitals:   05/04/18 1944 05/05/18 0344 05/05/18 0721 05/05/18 1205  BP: 119/72 (!) 99/55 129/79 116/74  Pulse: 98 90 86 82  Resp: (!) 24 (!) 24 17   Temp: 98.5 F (36.9 C) 98.5 F (36.9 C) 98.5 F (36.9 C)   TempSrc: Oral Oral Oral   SpO2: 96% 100% 98%   Weight:  125.4 kg    Height:        Intake/Output Summary (Last 24 hours) at 05/05/2018 1241 Last data filed at 05/05/2018 0344 Gross per 24 hour  Intake -  Output 1150 ml  Net -1150 ml   Filed Weights   05/03/18 2053 05/04/18 0044 05/05/18 0344  Weight: 122.5 kg 124.2 kg 125.4 kg    Telemetry    IRIR, PVCs ventricular rates still uncontrolled - Personally Reviewed  ECG    No new tracings- Personally Reviewed  Physical Exam   GEN: No acute distress.   Neck: No JVD Cardiac: IRIR, no murmurs, rubs, or gallops.  Respiratory: Reduced bibasilar breath sounds. Slight wheezing  bilaterally. Unable to take deep breaths without cough. GI: Soft, nontender, non-distended  MS: No bilateral lower extremity edema; No deformity. Neuro:  Nonfocal  Psych: Normal affect   Labs    Chemistry Recent Labs  Lab 05/03/18 2110 05/04/18 0200 05/05/18 0433  NA 134* 133* 132*  K 3.7 3.6 4.4  CL 102 100 101  CO2 23 24 23   GLUCOSE 105* 115* 101*  BUN 20 21* 21*  CREATININE 1.45* 1.51* 1.37*  CALCIUM 8.8* 8.7* 9.2  PROT 8.4*  --   --   ALBUMIN 3.7  --   --   AST 26  --   --   ALT 31  --   --   ALKPHOS 119  --   --   BILITOT 0.7  --   --   GFRNONAA 53* 50* 56*  GFRAA >60 58* >60  ANIONGAP 9 9 8      Hematology Recent Labs  Lab 05/03/18 2110 05/04/18 0200 05/05/18 0433  WBC 15.6* 13.3* 14.5*  RBC 5.54 5.43 5.77  HGB 13.9 13.7 14.9  HCT 43.2 42.9 45.7  MCV 78.0* 79.0* 79.2*  MCH 25.1* 25.2* 25.8*  MCHC 32.2 31.9 32.6  RDW 15.6* 15.7* 15.8*  PLT 312 321 338    Cardiac Enzymes Recent Labs  Lab 05/03/18 2110 05/04/18 0200 05/04/18 0750 05/04/18 1234  TROPONINI 0.03* <0.03 <  0.03 0.03*   No results for input(s): TROPIPOC in the last 168 hours.   BNP Recent Labs  Lab 05/03/18 2110  BNP 248.0*     DDimer No results for input(s): DDIMER in the last 168 hours.   Radiology    Dg Chest 2 View  Result Date: 05/03/2018 CLINICAL DATA:  Cough, congestion EXAM: CHEST - 2 VIEW COMPARISON:  04/28/2011 FINDINGS: Linear atelectasis at the left lung base. Right lung clear. Heart is normal size. No effusions or acute bony abnormality. IMPRESSION: Left base atelectasis. Electronically Signed   By: Rolm Baptise M.D.   On: 05/03/2018 21:27    Cardiac Studies   05/04/2018 TTE Left ventricle: The cavity size was normal. There was mild   concentric hypertrophy. Systolic function was normal. The   estimated ejection fraction was in the range of 50% to 55%. Wall   motion was normal; there were no regional wall motion   abnormalities. The study is not technically  sufficient to allow   evaluation of LV diastolic function. - Left atrium: The atrium was normal in size. - Right ventricle: Systolic function was normal. - Pulmonary arteries: Systolic pressure could not be accurately   estimated. Impressions: - Rhythm is atrial fibrillation.  Patient Profile     58 y.o. male hx of HTN, DM2, HLD, CKD3 and palpitations who is being seen today for the evaluation of new onset Afib with RVR.  Assessment & Plan    Afib with RVR - Relatively asymptomatic while in hospital. Does report cough; however, in the setting of suspected bronchitis. In past, he has felt palpitations and feelings of racing HR. Labs: TSH 2.020, A1C 6.5. Echo as above and updated this admission. Ruled a difficult study with mild concentric hypertrophy and normal LVEF. See above. - Daily BMET recommended to monitor renal function and electrolytes. Cr 1.37 with baseline 1.4-1.7. Most recent  K 4.4 - Rate control: ventricular rate has been difficult to control and likely exacerbated by active pulmonary infection (WBC 14.5 / patient suspected bronchitis), as well as dehydration with high ventricular rates and borderline soft BP. He was started on a saline drip already today. BB consolidated and increased to Toprol XL 100mg  daily with current HR 100-high 150s; SBP 110s-120s at rest. Per Dr. Rockey Situ, could consider addition of digoxin for rate control; however, would defer if possible given reduced renal function. Recommend monitor HR with ambulation in hall to assess rise in ventricular rates with exertion. - Continue anticoagulation: Onset of Afib unclear. Cannot guarantee onset in last 24h-48h. Given CHA2DS2VASc score of at least 2 (HTN, DM2), recommendation for anticoagulation. Started on Xarelto 20mg  po qd yesterday. Daily CBC recommended.  - Recommend outpatient follow-up in our office including outpatient labs. Once patient is therapeutically anticoagulated (after 1 month of anticoagulation), could  consider DCCV. No cardioversion recommended at this time as not therapeutically anticoagulated. Rate control only. Consider outpatient sleep study to r/o OSA or get CPAP if needed. Recommend lifestyle changes, including diet and exercise / weight loss.    HTN - BP borderline / soft at times but controlled today. Continue to monitor BP with increase in metoprolol as above for rate control and with consideration of BP. Holding PTA amlodipine - continue holding and will likely discontinue at discharge given BB addition this admission.  - Continue holding PTA combination ARB (valsartan-HCTZ). Considering comorbid HTN and DM, recommend restart prior to discharge if BP, renal function, and K+ allow.   HLD - Continue statin therapy  with pravastatin 40mg  daily. Considering patient risk factors, would benefit from further ischemic workup as an outpatient in the future. Would also recommend rechecking LDL in the future.   CKD3 - Continue to monitor renal function and electrolytes with daily BMET. Started on saline per IM. Recommend strict glucose monitoring to prevent worsening renal function.   DM2 - Updated A1C - 6.5 - SSI. PTA medication on hold. Per IM  Elevated WBC - Worsening pulmonary sx. Elevated WBC. Per IM  For questions or updates, please contact [place] Please consult [email or web address] for contact info under        Signed, Arvil Chaco, PA-C  05/05/2018, 12:41 PM

## 2018-05-06 ENCOUNTER — Telehealth: Payer: Self-pay | Admitting: *Deleted

## 2018-05-06 LAB — CBC
HCT: 43.7 % (ref 39.0–52.0)
HEMOGLOBIN: 14.1 g/dL (ref 13.0–17.0)
MCH: 25.4 pg — ABNORMAL LOW (ref 26.0–34.0)
MCHC: 32.3 g/dL (ref 30.0–36.0)
MCV: 78.7 fL — AB (ref 80.0–100.0)
Platelets: 311 10*3/uL (ref 150–400)
RBC: 5.55 MIL/uL (ref 4.22–5.81)
RDW: 15.5 % (ref 11.5–15.5)
WBC: 13.9 10*3/uL — ABNORMAL HIGH (ref 4.0–10.5)
nRBC: 0 % (ref 0.0–0.2)

## 2018-05-06 LAB — GLUCOSE, CAPILLARY
Glucose-Capillary: 109 mg/dL — ABNORMAL HIGH (ref 70–99)
Glucose-Capillary: 89 mg/dL (ref 70–99)

## 2018-05-06 LAB — BASIC METABOLIC PANEL
Anion gap: 6 (ref 5–15)
BUN: 26 mg/dL — ABNORMAL HIGH (ref 6–20)
CO2: 24 mmol/L (ref 22–32)
Calcium: 8.9 mg/dL (ref 8.9–10.3)
Chloride: 104 mmol/L (ref 98–111)
Creatinine, Ser: 1.49 mg/dL — ABNORMAL HIGH (ref 0.61–1.24)
GFR, EST AFRICAN AMERICAN: 59 mL/min — AB (ref >60)
GFR, EST NON AFRICAN AMERICAN: 51 mL/min — AB (ref >60)
Glucose, Bld: 90 mg/dL (ref 70–99)
Potassium: 4.7 mmol/L (ref 3.5–5.1)
Sodium: 134 mmol/L — ABNORMAL LOW (ref 135–145)

## 2018-05-06 MED ORDER — BENZONATATE 200 MG PO CAPS: 200.0000 mg | ORAL_CAPSULE | Freq: Three times a day (TID) | ORAL | 0 refills | Status: DC | PRN

## 2018-05-06 MED ORDER — RIVAROXABAN 20 MG PO TABS: 20.0000 mg | ORAL_TABLET | Freq: Every day | ORAL | 0 refills | Status: DC

## 2018-05-06 MED ORDER — DIGOXIN 250 MCG PO TABS: 0.2500 mg | ORAL_TABLET | Freq: Every day | ORAL | 0 refills | Status: DC

## 2018-05-06 MED ORDER — METOPROLOL SUCCINATE ER 100 MG PO TB24: 100.0000 mg | ORAL_TABLET | Freq: Every day | ORAL | 0 refills | Status: DC

## 2018-05-06 MED ORDER — NICOTINE 21 MG/24HR TD PT24: 21.0000 mg | MEDICATED_PATCH | Freq: Every day | TRANSDERMAL | 0 refills | Status: DC

## 2018-05-06 MED ORDER — LEVOFLOXACIN 500 MG PO TABS: 500.0000 mg | ORAL_TABLET | Freq: Every day | ORAL | 0 refills | Status: AC

## 2018-05-06 NOTE — Telephone Encounter (Signed)
Patient contacted regarding discharge from [facility] on 05/06/18.   Patient understands to follow up with provider ? On 05/18/18 at 8:20 AM at [facility].  Patient understands discharge instructions? Yes   Patient understands medications and regiment? Yes Patient understands to bring all medications to this visit? Yes

## 2018-05-06 NOTE — Progress Notes (Signed)
Progress Note  Patient Name: Jon Mendez Date of Encounter: 05/06/2018  Primary Cardiologist: [facility], Dr. Rockey Situ  Subjective   Denies CP, palpitations, and feelings of racing HR. Went to the restroom ~ 8:20AM, at which time telemetry shows elevated ventricular rate ~ 145bpm. Also had a run of 3PVCs ~11:46; however, per patient, no symptoms at that time.   SOB and cough significantly improved with abx. Ambulated yesterday with elevated rates and subsequent addition of digoxin to medications. Remains on Toprol XL 100mg  daily. BP borderline soft and currently still receiving fluids but no symptoms of pre-syncope or syncope.  Plans to follow-up with our office. Plans for DCCV in a month.  Inpatient Medications    Scheduled Meds: . digoxin  0.25 mg Oral Daily  . insulin aspart  0-9 Units Subcutaneous TID WC  . metoprolol succinate  100 mg Oral Daily  . nicotine  21 mg Transdermal Daily  . pravastatin  40 mg Oral QHS  . rivaroxaban  20 mg Oral Daily   Continuous Infusions: . cefTRIAXone (ROCEPHIN)  IV 1 g (05/06/18 0936)   PRN Meds: acetaminophen **OR** acetaminophen, benzonatate, chlorpheniramine-HYDROcodone, ondansetron **OR** ondansetron (ZOFRAN) IV   Vital Signs    Vitals:   05/05/18 1957 05/06/18 0427 05/06/18 0819 05/06/18 0927  BP: 112/89 119/86 109/77   Pulse: 95 95 (!) 104 (!) 133  Resp: 20 (!) 22    Temp: 98 F (36.7 C) 98.1 F (36.7 C) 98.7 F (37.1 C)   TempSrc: Oral Oral Oral   SpO2: 100% 95% 97%   Weight:  125.9 kg    Height:       No intake or output data in the 24 hours ending 05/06/18 1200 Filed Weights   05/04/18 0044 05/05/18 0344 05/06/18 0427  Weight: 124.2 kg 125.4 kg 125.9 kg    Telemetry    IRIR, PVCs, ventricular rates 80-140s - Personally Reviewed  ECG    No new tracings- Personally Reviewed  Physical Exam   GEN: No acute distress.   Neck: No JVD Cardiac: IRIR, no murmurs, rubs, or gallops.  Respiratory: Coarse breath  sounds bilaterally, significantly improved from admission date.  GI: Soft, nontender, non-distended  MS: Minimal bilateral likely dependent edema; No deformity. Neuro:  Nonfocal  Psych: Normal affect   Labs    Chemistry Recent Labs  Lab 05/03/18 2110 05/04/18 0200 05/05/18 0433  NA 134* 133* 132*  K 3.7 3.6 4.4  CL 102 100 101  CO2 23 24 23   GLUCOSE 105* 115* 101*  BUN 20 21* 21*  CREATININE 1.45* 1.51* 1.37*  CALCIUM 8.8* 8.7* 9.2  PROT 8.4*  --   --   ALBUMIN 3.7  --   --   AST 26  --   --   ALT 31  --   --   ALKPHOS 119  --   --   BILITOT 0.7  --   --   GFRNONAA 53* 50* 56*  GFRAA >60 58* >60  ANIONGAP 9 9 8      Hematology Recent Labs  Lab 05/04/18 0200 05/05/18 0433 05/06/18 0406  WBC 13.3* 14.5* 13.9*  RBC 5.43 5.77 5.55  HGB 13.7 14.9 14.1  HCT 42.9 45.7 43.7  MCV 79.0* 79.2* 78.7*  MCH 25.2* 25.8* 25.4*  MCHC 31.9 32.6 32.3  RDW 15.7* 15.8* 15.5  PLT 321 338 311    Cardiac Enzymes Recent Labs  Lab 05/03/18 2110 05/04/18 0200 05/04/18 0750 05/04/18 1234  TROPONINI 0.03* <0.03 <  0.03 0.03*   No results for input(s): TROPIPOC in the last 168 hours.   BNP Recent Labs  Lab 05/03/18 2110  BNP 248.0*     DDimer No results for input(s): DDIMER in the last 168 hours.   Radiology    No results found.  Cardiac Studies   05/04/2018 TTE Left ventricle: The cavity size was normal. There was mild   concentric hypertrophy. Systolic function was normal. The   estimated ejection fraction was in the range of 50% to 55%. Wall   motion was normal; there were no regional wall motion   abnormalities. The study is not technically sufficient to allow   evaluation of LV diastolic function. - Left atrium: The atrium was normal in size. - Right ventricle: Systolic function was normal. - Pulmonary arteries: Systolic pressure could not be accurately   estimated. Impressions: - Rhythm is atrial fibrillation.  Patient Profile     58 y.o. male hx of  HTN, DM2, HLD, CKD3 and palpitations who is being seen today for the evaluation of new onset Afib with RVR.  Assessment & Plan    Afib with RVR - Remains in Afib. Relatively asymptomatic with rates exacerbated d/t infection. Echo as above and updated this admission. See above. - High ventricular rates and borderline soft BP during admission. CHA2DS2VASc score of at least 2 (HTN, DM2).  - Continue Toprol XL 100mg  daily and digoxin for rate control. Would recommend discontinue the PTA amlodipine given soft BP. Continue anticoagulation with Xarelto.  - Recommend outpatient follow-up in our office in 2 weeks, including outpatient labs to check digoxin level, BMP, CBC. Once patient is therapeutically anticoagulated (after 1 month of anticoagulation), recommend DCCV. Will need TCM scheduled in our office.  - Consider outpatient sleep study to r/o OSA or get CPAP if needed. Recommend lifestyle changes, including diet and exercise / weight loss.    HTN - BP borderline / soft at times. Recommend continue medical therapy as above. Discontinue amlodipine at discharge given BB addition this admission and soft Bps. - Considering comorbid HTN and DM, as outpatient, recommend ACE/ARB therapy as BP tolerates. PTA diovan previously prescribed: continue to hold d/t soft pressures.  HLD - Continue statin therapy with pravastatin 40mg  daily. Considering patient risk factors, would benefit from further ischemic workup as an outpatient in the future. Would also recommend rechecking LDL in the future.   CKD3 - Continue to monitor renal function and electrolytes with BMET. Receiving IVF. Recommend strict glucose monitoring to prevent worsening renal function.   DM2 - Updated A1C - 6.5 - Per IM  Elevated WBC - Per IM  For questions or updates, please contact [place] Please consult [email or web address] for contact info under        Signed, Arvil Chaco, PA-C  05/06/2018, 12:00 PM

## 2018-05-06 NOTE — Progress Notes (Signed)
Patient discharged to home with family.  Tele and IV d/c'd prior to discharge.  Patient and family verbalized understanding of discharge instructions.

## 2018-05-06 NOTE — Discharge Summary (Addendum)
[facility] [place] NAME: Jon Mendez    MR#:  [id number]  DATE OF BIRTH:  03/23/1960  DATE OF ADMISSION:  05/03/2018 ADMITTING PHYSICIAN: Lance Coon, MD  DATE OF DISCHARGE: 05/06/2018  PRIMARY CARE PHYSICIAN: Glean Hess, MD   ADMISSION DIAGNOSIS:  Cough [R05] Atrial fibrillation, new onset ([employer]) [I48.91]  DISCHARGE DIAGNOSIS:  Atrial fibrillation with rapid rate Community-acquired pneumonia Diabetes mellitus type 2 Hyperlipidemia Hypertension  SECONDARY DIAGNOSIS:   Past Medical History:  Diagnosis Date  . CKD (chronic kidney disease), stage III ([place])   . Diabetes mellitus without complication ([place])   . H/O hidradenitis suppurativa   . H/O varicose veins   . High cholesterol   . Hypertension   . Vertigo      ADMITTING HISTORY Jon Mendez  is a 58 y.o. male who presents with chief complaint as above.  Patient presented to urgent care today to be evaluated for a cough that has been going on for about a week.  While there he was noted to be tachycardic and in A. fib.  This is a new diagnosis for this patient, so he was sent to the ED for evaluation.  He complains of this cough and has had some yellow-greenish sputum production with it.  Work-up in the ED does not show any pneumonia, though does indicate possibility of some CHF with an elevated BNP.  Hospitalist were called for admission and further evaluation   HOSPITAL COURSE:  Patient was admitted to telemetry.  Cardiology consultation was done with [facility] health cardiology.  Patient was initially started on IV Cardizem drip for rate control and later on switched to oral metoprolol.  Metoprolol dose was increased to 100 mg orally daily for better control of heart rate.  Patient was started initially on oral Zithromax antibiotic but upgraded to IV Rocephin and Zithromax antibiotics for pneumonia.  Antitussive medication and mucolytic's were given for cough with  expectoration.  His leukocytosis improved.  Patient's cough also improved.  Digoxin was also added for rate control.  Patient was worked up with echocardiogram.  Echocardiogram revealed EF of 50 to 55% with no wall motion abnormality.  Anticoagulation was started with oral Xarelto.  Cardiology recommended continue anticoagulation, rate control and possible [place] at a later date 1 month after anticoagulation.  Patient hemodynamically stable will be discharged home today Case has been discussed with cardiology.  CONSULTS OBTAINED:  Treatment Team:  Minna Merritts, MD  DRUG ALLERGIES:   Allergies  Allergen Reactions  . Eggs Or Egg-Derived Products Shortness Of Breath    Throat swells. Patient able to eat products that contain egg just not whole egg alone.  . Fish Allergy Shortness Of Breath    Throat swells    DISCHARGE MEDICATIONS:   Allergies as of 05/06/2018      Reactions   Eggs Or Egg-derived Products Shortness Of Breath   Throat swells. Patient able to eat products that contain egg just not whole egg alone.   Fish Allergy Shortness Of Breath   Throat swells      Medication List    STOP taking these medications   amLODipine 5 MG tablet Commonly known as:  NORVASC   valsartan-hydrochlorothiazide 160-25 MG tablet Commonly known as:  DIOVAN-HCT     TAKE these medications   allopurinol 100 MG tablet Commonly known as:  ZYLOPRIM TAKE 1 TABLET EVERY DAY   benzonatate 200 MG capsule Commonly known as:  TESSALON Take 1  capsule (200 mg total) by mouth 3 (three) times daily as needed for cough.   digoxin 0.25 MG tablet Commonly known as:  LANOXIN Take 1 tablet (0.25 mg total) by mouth daily for 30 days. Start taking on:  May 07, 2018   freestyle lancets Use as instructed   glucose blood test strip Commonly known as:  FREESTYLE LITE Use as instructed   JANUMET XR [phone] MG Tb24 Generic drug:  SitaGLIPtin-MetFORMIN HCl TAKE 1 TABLET BY MOUTH EVERY DAY    levofloxacin 500 MG tablet Commonly known as:  LEVAQUIN Take 1 tablet (500 mg total) by mouth daily for 5 days.   metoprolol succinate 100 MG 24 hr tablet Commonly known as:  TOPROL-XL Take 1 tablet (100 mg total) by mouth daily for 30 days. Take with or immediately following a meal. Start taking on:  May 07, 2018   multivitamin capsule Take 1 capsule by mouth daily.   nicotine 21 mg/24hr patch Commonly known as:  NICODERM CQ - dosed in mg/24 hours Place 1 patch (21 mg total) onto the skin daily. Start taking on:  May 07, 2018   pravastatin 40 MG tablet Commonly known as:  PRAVACHOL TAKE 1 TABLET BY MOUTH AT BEDTIME   rivaroxaban 20 MG Tabs tablet Commonly known as:  XARELTO Take 1 tablet (20 mg total) by mouth daily for 30 days.       Today  Patient seen and evaluated today No palpitations No cough Decreased shortness of breath No fever Feels better Hemodynamically stable VITAL SIGNS:  Blood pressure 108 / 78 mmHg, heart rate 96/min, respiratory rate 18/min, saturation 98% on room airTemperature 98.4 F  I/O:  No intake or output data in the 24 hours ending 05/06/18 1325  PHYSICAL EXAMINATION:  Physical Exam  GENERAL:  58 y.o.-year-old patient lying in the bed with no acute distress.  LUNGS: Normal breath sounds bilaterally, no wheezing, rales,rhonchi or crepitation. No use of accessory muscles of respiration.  CARDIOVASCULAR: S1, S2 irregular. No murmurs, rubs, or gallops.  ABDOMEN: Soft, non-tender, non-distended. Bowel sounds present. No organomegaly or mass.  NEUROLOGIC: Moves all 4 extremities. PSYCHIATRIC: The patient is alert and oriented x 3.  SKIN: No obvious rash, lesion, or ulcer.   DATA REVIEW:   CBC Recent Labs  Lab 05/06/18 0406  WBC 13.9*  HGB 14.1  HCT 43.7  PLT 311    Chemistries  Recent Labs  Lab 05/03/18 2110  05/06/18 1230  NA 134*   < > 134*  K 3.7   < > 4.7  CL 102   < > 104  CO2 23   < > 24  GLUCOSE 105*   < >  90  BUN 20   < > 26*  CREATININE 1.45*   < > 1.49*  CALCIUM 8.8*   < > 8.9  MG 2.3  --   --   AST 26  --   --   ALT 31  --   --   ALKPHOS 119  --   --   BILITOT 0.7  --   --    < > = values in this interval not displayed.    Cardiac Enzymes Recent Labs  Lab 05/04/18 1234  TROPONINI 0.03*    Microbiology Results  Results for orders placed or performed during the hospital encounter of 12/19/14  Fungus Culture with Smear     Status: None   Collection Time: 12/19/14  3:00 PM  Result Value Ref Range Status  Specimen Description EYE  Final   Special Requests NONE  Final   Culture NO FUNGUS ISOLATED AFTER 21 DAYS  Final   Report Status 01/10/2015 FINAL  Final  Eye culture     Status: None   Collection Time: 12/19/14  3:00 PM  Result Value Ref Range Status   Specimen Description EYE  Final   Special Requests NONE  Final   Culture NO GROWTH 3 DAYS  Final   Report Status 12/22/2014 FINAL  Final    RADIOLOGY:  No results found.  Follow up with PCP in 1 week.  Management plans discussed with the patient, family and they are in agreement.  CODE STATUS: Full code    Code Status Orders  (From admission, onward)         Start     Ordered   05/04/18 0040  Full code  Continuous     05/04/18 0039        Code Status History    This patient has a current code status but no historical code status.      TOTAL TIME TAKING CARE OF THIS PATIENT ON DAY OF DISCHARGE: more than 35 minutes.   Saundra Shelling M.D on 05/06/2018 at 1:25 PM  Between 7am to 6pm - Pager - [phone]  After 6pm go to [email or web address] - password [email or web address] [facility]  [facility] [place] [facility]  Office  [phone]  CC: Primary care physician; Glean Hess, MD  Note: This dictation was prepared with Dragon dictation along with smaller phrase technology. Any transcriptional errors that result from this process are unintentional.

## 2018-05-06 NOTE — Telephone Encounter (Signed)
-----   Message from Clarisse Gouge sent at 05/06/2018  1:11 PM EST ----- Regarding: FW: TCM TCM....   They are scheduled to see  Dr. Rockey Situ 2/11 at 820 am   They were seen for Afib with RVR and is considering DCCV in the next month  They need to be seen within 2 weeks        ----- Message ----- From: Arvil Chaco, PA-C Sent: 05/06/2018  12:59 PM EST To: Rebeca Alert [facility] Scheduling Subject: TCM                                            Good afternoon,  Please schedule Mr. Girardin for TCM appointment with Dr. Rockey Situ within the next 2 weeks.  He was seen in the hospital for Afib with RVR and is considering DCCV in the next month. He will be discharged today.  Thank you!  Signed, Arvil Chaco, PA-C 05/06/2018, 1:00 PM Pager [phone]

## 2018-05-06 NOTE — Plan of Care (Signed)
  Problem: Education: Goal: Knowledge of General Education information will improve Description Including pain rating scale, medication(s)/side effects and non-pharmacologic comfort measures Outcome: Progressing   Problem: Clinical Measurements: Goal: Respiratory complications will improve Outcome: Progressing   Problem: Education: Goal: Knowledge of disease or condition will improve Outcome: Progressing Goal: Understanding of medication regimen will improve Outcome: Progressing

## 2018-05-07 ENCOUNTER — Telehealth: Payer: Self-pay

## 2018-05-07 NOTE — Telephone Encounter (Signed)
Transition Care Management Follow-up Telephone Call  Date of discharge and from where: 05/06/18 [facility]  How have you been since you were released from the hospital? Pt states he is doing okay and feeling better  Any questions or concerns? No   Items Reviewed:  Did the pt receive and understand the discharge instructions provided? Yes   Medications obtained and verified? Yes   Any new allergies since your discharge? No   Dietary orders reviewed? Yes  Do you have support at home? Yes   Functional Questionnaire: (I = Independent and D = Dependent) ADLs: I  Bathing/Dressing- I  Meal Prep- I  Eating- I  Maintaining continence- I  Transferring/Ambulation- I  Managing Meds- I  Follow up appointments reviewed:   PCP Hospital f/u appt confirmed? Yes  Scheduled to see Dr. Army Melia on 05/24/18 @ 2:20.  [facility] f/u appt confirmed? Yes  Scheduled to see Dr. Rockey Situ on 05/18/18 @ 8:20.  Are transportation arrangements needed? No   If their condition worsens, is the pt aware to call PCP or go to the Emergency Dept.? Yes  Was the patient provided with contact information for the PCP's office or ED? Yes  Was to pt encouraged to call back with questions or concerns? Yes

## 2018-05-16 NOTE — Progress Notes (Signed)
Cardiology Office Note  Date:  05/18/2018   ID:  Jon Mendez, DOB: 03/23/1960, MRN: [id number]  PCP:  Glean Hess, MD   Chief Complaint  Patient presents with  . OTHER    Afib and sob. Meds reviewed verbally with pt.    HPI:  Jon Mendez is a 58 y.o. male with a PMHx of: CKD, stage III DM High Cholesterol HTN Hx of Hidradenitis suppurativa Hx of varicose veins He presents for follow-up after recent hospitalization for atrial fibrillation and to establish care in the [facility] office  INTERVAL HISTORY: The patient is here today for follow up after recent hospital visit on 05/03/2018. He is accompanied by his wife.   He feels better overall, but still endorses mild SOB. He is not sure if he has sleep apnea, however, his wife does endorse he stops breathing when he is snoring. He follows up with his PCP on Monday, 05/24/2018.   Denies tachycardia or palpitations Compliant with his Xarelto 20 mg daily Interested in restoring normal sinus rhythm  Blood pressure 128/80 on today's visit  EKG personally reviewed by myself on todays visit Shows Afib. 99 bpm.  Consider inferior infarct   OTHER PAST MEDICAL HISTORY REVIEWED BY ME FOR TODAY'S VISIT: 05/03/2018 ED visit: He presented to Urgent Care with a cough, rhinorrhea and nasal congestion. His wife was seen in Urgent care the day before and was diagnosed with bronchitis. Associated symptoms included fatigue, chills and fever at onset, but has improved since. He denied chest pain and SOB. On exam, noted to have irregular heart rhythm further evaluated with EKG noted to have A. fib with RVR. He was transported to the ED for treatment and evaluation of irregular heart rate. Patient denies history of Afib. He is not followed by cardiology as he has not had any concerning symptoms previously.   Left ventricle: The cavity size was normal. There was mild concentric hypertrophy. Systolic function was normal. The estimated ejection  fraction was in the range of 50% to 55%. Wall motion was normal; there were no regional wall motion  PMH:   has a past medical history of CKD (chronic kidney disease), stage III ([place]), Diabetes mellitus without complication ([place]), H/O hidradenitis suppurativa, H/O varicose veins, High cholesterol, Hypertension, and Vertigo.  PSH:    Past Surgical History:  Procedure Laterality Date  . COLONOSCOPY WITH PROPOFOL N/A 06/04/2015   Procedure: COLONOSCOPY WITH PROPOFOL;  Surgeon: Lucilla Lame, MD;  5 tubular adenomas  . HIP ARTHROPLASTY Left 2013  . LEG SKIN LESION  BIOPSY / EXCISION Right 2015   Hidradenitis  . POLYPECTOMY  06/04/2015   Procedure: POLYPECTOMY;  Surgeon: Lucilla Lame, MD;  Location: [facility];  Service: Endoscopy;;  . VARICOSE VEIN SURGERY Bilateral 2015    Current Outpatient Medications  Medication Sig Dispense Refill  . allopurinol (ZYLOPRIM) 100 MG tablet TAKE 1 TABLET EVERY DAY (Patient taking differently: Take 100 mg by mouth daily. ) 30 tablet 12  . benzonatate (TESSALON) 200 MG capsule Take 1 capsule (200 mg total) by mouth 3 (three) times daily as needed for cough. 20 capsule 0  . digoxin (LANOXIN) 0.25 MG tablet Take 1 tablet (0.25 mg total) by mouth daily for 30 days. 30 tablet 0  . glucose blood (FREESTYLE LITE) test strip Use as instructed 100 each 12  . JANUMET XR [phone] MG TB24 TAKE 1 TABLET BY MOUTH EVERY DAY (Patient taking differently: Take 1 tablet by mouth daily. ) 30 tablet 5  .  Lancets (FREESTYLE) lancets Use as instructed 100 each 12  . metoprolol succinate (TOPROL-XL) 100 MG 24 hr tablet Take 1 tablet (100 mg total) by mouth daily for 30 days. Take with or immediately following a meal. 30 tablet 0  . Multiple Vitamin (MULTIVITAMIN) capsule Take 1 capsule by mouth daily.    . pravastatin (PRAVACHOL) 40 MG tablet TAKE 1 TABLET BY MOUTH AT BEDTIME (Patient taking differently: Take 40 mg by mouth at bedtime. ) 30 tablet 12  . rivaroxaban (XARELTO) 20  MG TABS tablet Take 1 tablet (20 mg total) by mouth daily for 30 days. 30 tablet 0   No current facility-administered medications for this visit.      ALLERGIES:   Eggs or egg-derived products and Fish allergy   SOCIAL HISTORY:  The patient  reports that he has been smoking cigarettes. He has a 15.00 pack-year smoking history. He has never used smokeless tobacco. He reports that he does not drink alcohol or use drugs.   FAMILY HISTORY:   family history includes Diabetes in his brother, brother, and sister; Hypertension in his mother; Lymphoma in his father.    REVIEW OF SYSTEMS: Review of Systems  Constitutional: Negative.   Eyes: Negative.   Respiratory: Positive for shortness of breath.   Cardiovascular: Negative.  Negative for chest pain.  Gastrointestinal: Negative.   Genitourinary: Negative.   Musculoskeletal: Negative.   Neurological: Negative.   Psychiatric/Behavioral: Negative.   All other systems reviewed and are negative.    PHYSICAL EXAM: VS:  BP 128/80 (BP Location: Left Arm, Patient Position: Sitting, Cuff Size: Large)   Pulse 99   Ht 5\' 11"  (1.803 m)   Wt 292 lb 8 oz (132.7 kg)   BMI 40.80 kg/m  , BMI Body mass index is 40.8 kg/m.  GEN: Well nourished, well developed, in no acute distress HEENT: normal Neck: no JVD, carotid bruits, or masses Cardiac: Irregular rate RR; no murmurs, rubs, or gallops,no edema  Respiratory:  Rales to bilateral lungs, normal work of breathing GI: soft, nontender, nondistended, + BS MS: no deformity or atrophy Skin: warm and dry, no rash Neuro:  Strength and sensation are intact Psych: euthymic mood, full affect    RECENT LABS: 05/03/2018: ALT 31; B Natriuretic Peptide 248.0; Magnesium 2.3; TSH 3.208 05/06/2018: BUN 26; Creatinine, Ser 1.49; Hemoglobin 14.1; Platelets 311; Potassium 4.7; Sodium 134    LIPID PANEL:     WEIGHT: Wt Readings from Last 3 Encounters:  05/18/18 292 lb 8 oz (132.7 kg)  05/06/18 277 lb 8 oz  (125.9 kg)  05/03/18 270 lb (122.5 kg)       ASSESSMENT AND PLAN:  New onset atrial fibrillation ([place]) Plan: EKG 12-Lead Coupon for xarelto, as he has commercial coverage Refilled Rx tessalon until he is able to follow up with Dr. Army Melia. Check Digoxin level We have refilled his cardiac medications  We will start diltiazem 30 mg twice daily, continue metoprolol and Xarelto recommend Cardioversion beginning of March Recommend he follows up with Glean Hess, MD for sleep study. -We have recommended EKG day prior to his cardioversion  Obstructive sleep apnea Needs sleep study  Smoker We have encouraged him to continue to work on weaning his cigarettes and smoking cessation. He will continue to work on this and does not want any assistance with chantix.   Bronchitis Recent hospitalization likely contributing to onset of atrial fibrillation COPD exacerbation Slowly improving, still with residual cough   Disposition:   F/U  6  weeks  Total encounter time more than 25 minutes. Greater than 50% was spent in counseling and coordination of care with the patient.    Orders Placed This Encounter  Procedures  . EKG 12-Lead    I, Margit Banda am acting as a scribe for Ida Rogue, M.D., Ph.D.  I have reviewed the above documentation for accuracy and completeness, and I agree with the above.   Signed, Esmond Plants, M.D., Ph.D. 05/18/2018  [place], [place]

## 2018-05-18 ENCOUNTER — Ambulatory Visit: Payer: Commercial Managed Care - PPO | Admitting: Cardiovascular Disease

## 2018-05-18 ENCOUNTER — Encounter: Payer: Self-pay | Admitting: Cardiovascular Disease

## 2018-05-18 VITALS — BP 128/80 | HR 99 | Ht 71.0 in | Wt 292.5 lb

## 2018-05-18 DIAGNOSIS — I359 Nonrheumatic aortic valve disorder, unspecified: Secondary | ICD-10-CM | POA: Diagnosis not present

## 2018-05-18 DIAGNOSIS — I4891 Unspecified atrial fibrillation: Secondary | ICD-10-CM

## 2018-05-18 MED ORDER — BENZONATATE 200 MG PO CAPS: 200.0000 mg | ORAL_CAPSULE | Freq: Three times a day (TID) | ORAL | 0 refills | Status: DC | PRN

## 2018-05-18 MED ORDER — DILTIAZEM HCL 30 MG PO TABS: 30.0000 mg | ORAL_TABLET | Freq: Two times a day (BID) | ORAL | 6 refills | Status: DC

## 2018-05-18 MED ORDER — METOPROLOL SUCCINATE ER 100 MG PO TB24: 100.0000 mg | ORAL_TABLET | Freq: Every day | ORAL | 6 refills | Status: DC

## 2018-05-18 MED ORDER — RIVAROXABAN 20 MG PO TABS: 20.0000 mg | ORAL_TABLET | Freq: Every day | ORAL | 6 refills | Status: DC

## 2018-05-18 MED ORDER — DIGOXIN 250 MCG PO TABS: 0.2500 mg | ORAL_TABLET | Freq: Every day | ORAL | 6 refills | Status: DC

## 2018-05-18 NOTE — Patient Instructions (Addendum)
Coupon for xarelto,commercial coverage  Ask Dr. Carolin Coy about a sleep study  We will refill tessalon pearls   Medication Instructions:  Your physician has recommended you make the following change in your medication:  1. START Diltiazem 30 mg twice a day   If you need a refill on your cardiac medications before your next appointment, please call your pharmacy.    Lab work: We will check a digoxin level today   If you have labs (blood work) drawn today and your tests are completely normal, you will receive your results only by: Marland Kitchen MyChart Message (if you have MyChart) OR . A paper copy in the mail If you have any lab test that is abnormal or we need to change your treatment, we will call you to review the results.   Testing/Procedures: You are scheduled for a Cardioversion on _Thursday March 12th_ with Dr._Gollan_ Please arrive at the [facility] at _06:30_ a.m. on the day of your procedure.  DIET INSTRUCTIONS:  Nothing to eat or drink after midnight except your medications with a small sip of water.         1) Labs: _EKG & Labs that week before procedure.__  2) Medications:  YOU MAY TAKE ALL of your remaining medications with a small amount of water.  3) Must have a responsible person to drive you home.  4) Bring a current list of your medications and current insurance cards.    If you have any questions after you get home, please call the office at [phone]    Follow-Up: At [facility], you and your health needs are our priority.  As part of our continuing mission to provide you with exceptional heart care, we have created designated Provider Care Teams.  These Care Teams include your primary Cardiologist (physician) and Advanced Practice Providers (APPs -  Physician Assistants and Nurse Practitioners) who all work together to provide you with the care you need, when you need it.  . You will need a follow up appointment in 6 weeks   . Providers on  your designated Care Team:   . Murray Hodgkins, NP . Christell Faith, PA-C . Marrianne Mood, PA-C  Any Other Special Instructions Will Be Listed Below (If Applicable).  For educational health videos Log in to : [email or web address] Or : [email or web address], password : [email or web address]   Electrical Cardioversion  Electrical cardioversion is the delivery of a jolt of electricity to restore a normal rhythm to the heart. A rhythm that is too fast or is not regular keeps the heart from pumping well. In this procedure, sticky patches or metal paddles are placed on the chest to deliver electricity to the heart from a device. This procedure may be done in an emergency if:  There is low or no blood pressure as a result of the heart rhythm.  Normal rhythm must be restored as fast as possible to protect the brain and heart from further damage.  It may save a life. This procedure may also be done for irregular or fast heart rhythms that are not immediately life-threatening. Tell a health care provider about:  Any allergies you have.  All medicines you are taking, including vitamins, herbs, eye drops, creams, and over-the-counter medicines.  Any problems you or family members have had with anesthetic medicines.  Any blood disorders you have.  Any surgeries you have had.  Any medical conditions you have.  Whether you are pregnant or may be pregnant. What are the risks? Generally, this  is a safe procedure. However, problems may occur, including:  Allergic reactions to medicines.  A blood clot that breaks free and travels to other parts of your body.  The possible return of an abnormal heart rhythm within hours or days after the procedure.  Your heart stopping (cardiac arrest). This is rare. What happens before the procedure? Medicines  Your health care provider may have you start taking: ? Blood-thinning medicines (anticoagulants) so your blood does not clot as easily. ? Medicines may be given to  help stabilize your heart rate and rhythm.  Ask your health care provider about changing or stopping your regular medicines. This is especially important if you are taking diabetes medicines or blood thinners. General instructions  Plan to have someone take you home from the hospital or clinic.  If you will be going home right after the procedure, plan to have someone with you for 24 hours.  Follow instructions from your health care provider about eating or drinking restrictions. What happens during the procedure?  To lower your risk of infection: ? Your health care team will wash or sanitize their hands. ? Your skin will be washed with soap.  An IV tube will be inserted into one of your veins.  You will be given a medicine to help you relax (sedative).  Sticky patches (electrodes) or metal paddles may be placed on your chest.  An electrical shock will be delivered. The procedure may vary among health care providers and hospitals. What happens after the procedure?   Your blood pressure, heart rate, breathing rate, and blood oxygen level will be monitored until the medicines you were given have worn off.  Do not drive for 24 hours if you were given a sedative.  Your heart rhythm will be watched to make sure it does not change. This information is not intended to replace advice given to you by your health care provider. Make sure you discuss any questions you have with your health care provider. Document Released: 03/14/2002 Document Revised: 11/21/2015 Document Reviewed: 09/28/2015 Elsevier Interactive Patient Education  2019 [employer].

## 2018-05-19 LAB — DIGOXIN LEVEL: Digoxin, Serum: 1.5 ng/mL — ABNORMAL HIGH (ref 0.5–0.9)

## 2018-05-20 ENCOUNTER — Telehealth: Payer: Self-pay | Admitting: Cardiovascular Disease

## 2018-05-20 NOTE — Telephone Encounter (Signed)
Orders received via secure chat from Dr. Rockey Situ to have patient discontinue digoxin. Reviewed this recommendation with patient and he verbalized understanding with no further questions at this time.

## 2018-05-24 ENCOUNTER — Other Ambulatory Visit: Payer: Self-pay | Admitting: Internal Medicine

## 2018-05-24 ENCOUNTER — Encounter: Payer: Self-pay | Admitting: Internal Medicine

## 2018-05-24 ENCOUNTER — Ambulatory Visit: Payer: Commercial Managed Care - PPO | Admitting: Internal Medicine

## 2018-05-24 ENCOUNTER — Other Ambulatory Visit: Payer: Self-pay

## 2018-05-24 VITALS — BP 118/78 | HR 90 | Ht 71.0 in | Wt 281.0 lb

## 2018-05-24 DIAGNOSIS — I10 Essential (primary) hypertension: Secondary | ICD-10-CM

## 2018-05-24 DIAGNOSIS — I4891 Unspecified atrial fibrillation: Secondary | ICD-10-CM

## 2018-05-24 DIAGNOSIS — E1122 Type 2 diabetes mellitus with diabetic chronic kidney disease: Secondary | ICD-10-CM | POA: Diagnosis not present

## 2018-05-24 DIAGNOSIS — G4730 Sleep apnea, unspecified: Secondary | ICD-10-CM | POA: Diagnosis not present

## 2018-05-24 DIAGNOSIS — N183 Chronic kidney disease, stage 3 unspecified: Secondary | ICD-10-CM

## 2018-05-24 DIAGNOSIS — R05 Cough: Secondary | ICD-10-CM | POA: Diagnosis not present

## 2018-05-24 DIAGNOSIS — R059 Cough, unspecified: Secondary | ICD-10-CM

## 2018-05-24 MED ORDER — BENZONATATE 100 MG PO CAPS: 100.0000 mg | ORAL_CAPSULE | Freq: Three times a day (TID) | ORAL | 0 refills | Status: AC

## 2018-05-24 NOTE — Progress Notes (Signed)
Date:  05/24/2018   Name:  Jon Mendez   DOB:  03/23/1960   MRN:  [id number]   Chief Complaint: Diabetes (MICRO) and Hospitalization Follow-up  Atrial Fibrillation - new onset for which he was hospitalized.  He had his medications changed to control his rate.  Juliann Pares was held.  He was started on digoxin and metoprolol. At follow up last week, digoxin was stopped and cardiazem started.  Plans for DC cardioversion underway.  He was initially seen for cough and treated with antibiotics while in the hospital.  Still has a slight cough but he is 95% better.  Tessalon perles have helped.  Possible sleep apnea - wife notes snoring and gasping during sleep.  Because of the Afib, cardiology felt he should have a sleep study. Epworth sleep scale = 6   Review of Systems  Constitutional: Negative for chills, fatigue and fever.  HENT: Negative for trouble swallowing.   Eyes: Negative for visual disturbance.  Respiratory: Positive for cough. Negative for chest tightness, shortness of breath and wheezing.   Cardiovascular: Negative for chest pain, palpitations and leg swelling.  Neurological: Negative for dizziness and headaches.  Hematological: Does not bruise/bleed easily.  Psychiatric/Behavioral: Positive for sleep disturbance. Negative for dysphoric mood. The patient is not nervous/anxious.     Patient Active Problem List   Diagnosis Date Noted  . New onset atrial fibrillation ([place]) 05/03/2018  . Bronchitis 05/03/2018  . Controlled type 2 diabetes mellitus with stage 3 chronic kidney disease, without long-term current use of insulin ([place]) 09/19/2015  . Tubular adenoma of colon   . Chronic renal insufficiency, stage III (moderate) ([place]) 04/25/2015  . Chronic gouty arthritis 11/22/2014  . Hyperlipidemia associated with type 2 diabetes mellitus ([place]) 11/22/2014  . Essential (primary) hypertension 11/22/2014  . Compulsive tobacco user syndrome 11/22/2014  . Varicose veins of both  lower extremities 11/22/2014  . Hidradenitis suppurativa 09/06/2012  . Benign paroxysmal positional nystagmus 07/29/2004    Allergies  Allergen Reactions  . Eggs Or Egg-Derived Products Shortness Of Breath    Throat swells. Patient able to eat products that contain egg just not whole egg alone.  . Fish Allergy Shortness Of Breath    Throat swells    Past Surgical History:  Procedure Laterality Date  . COLONOSCOPY WITH PROPOFOL N/A 06/04/2015   Procedure: COLONOSCOPY WITH PROPOFOL;  Surgeon: Lucilla Lame, MD;  5 tubular adenomas  . HIP ARTHROPLASTY Left 2013  . LEG SKIN LESION  BIOPSY / EXCISION Right 2015   Hidradenitis  . POLYPECTOMY  06/04/2015   Procedure: POLYPECTOMY;  Surgeon: Lucilla Lame, MD;  Location: [facility];  Service: Endoscopy;;  . VARICOSE VEIN SURGERY Bilateral 2015    Social History   Tobacco Use  . Smoking status: Current Every Day Smoker    Packs/day: 0.50    Years: 30.00    Pack years: 15.00    Types: Cigarettes  . Smokeless tobacco: Never Used  . Tobacco comment: cutting back- 01/22/2017  Substance Use Topics  . Alcohol use: No    Alcohol/week: 4.0 standard drinks    Types: 4 Standard drinks or equivalent per week  . Drug use: No     Medication list has been reviewed and updated.  Current Meds  Medication Sig  . allopurinol (ZYLOPRIM) 100 MG tablet TAKE 1 TABLET EVERY DAY (Patient taking differently: Take 100 mg by mouth daily. )  . diltiazem (CARDIZEM) 30 MG tablet Take 1 tablet (30 mg total) by mouth  2 (two) times daily.  Marland Kitchen glucose blood (FREESTYLE LITE) test strip Use as instructed  . JANUMET XR [phone] MG TB24 TAKE 1 TABLET BY MOUTH EVERY DAY (Patient taking differently: Take 1 tablet by mouth daily. )  . Lancets (FREESTYLE) lancets Use as instructed  . metoprolol succinate (TOPROL-XL) 100 MG 24 hr tablet Take 1 tablet (100 mg total) by mouth daily for 30 days. Take with or immediately following a meal.  . Multiple Vitamin  (MULTIVITAMIN) capsule Take 1 capsule by mouth daily.  . pravastatin (PRAVACHOL) 40 MG tablet TAKE 1 TABLET BY MOUTH AT BEDTIME (Patient taking differently: Take 40 mg by mouth at bedtime. )  . rivaroxaban (XARELTO) 20 MG TABS tablet Take 1 tablet (20 mg total) by mouth daily for 30 days.    PHQ 2/9 Scores 05/24/2018 12/24/2017 04/28/2016 09/19/2015  PHQ - 2 Score 0 0 0 0    Physical Exam Vitals signs and nursing note reviewed.  Constitutional:      General: He is not in acute distress.    Appearance: He is well-developed.  HENT:     Head: Normocephalic and atraumatic.  Eyes:     Pupils: Pupils are equal, round, and reactive to light.  Neck:     Musculoskeletal: Normal range of motion and neck supple.  Cardiovascular:     Rate and Rhythm: Rhythm irregular.     Pulses: Normal pulses.  Pulmonary:     Effort: Pulmonary effort is normal. No respiratory distress.     Breath sounds: Normal breath sounds.  Musculoskeletal: Normal range of motion.  Lymphadenopathy:     Cervical: No cervical adenopathy.  Skin:    General: Skin is warm and dry.     Findings: No rash.  Neurological:     Mental Status: He is alert and oriented to person, place, and time.  Psychiatric:        Behavior: Behavior normal.        Thought Content: Thought content normal.     BP 118/78   Pulse 90   Ht 5\' 11"  (1.803 m)   Wt 281 lb (127.5 kg)   SpO2 96%   BMI 39.19 kg/m   Assessment and Plan: 1. Cough Completed antibiotics, much improved - benzonatate (TESSALON) 100 MG capsule; Take 1 capsule (100 mg total) by mouth 3 (three) times daily for 10 days.  Dispense: 30 capsule; Refill: 0  2. New onset atrial fibrillation (HCC) Continue beta blocker and CCB Digoxin stopped  3. Essential (primary) hypertension Off of benicar HCT for now controlled  4. Sleep disorder breathing Need sleep study due to Atrial fibrillation - Nocturnal polysomnography (NPSG); Future  5. Controlled type 2 diabetes mellitus  with stage 3 chronic kidney disease, without long-term current use of insulin (HCC) - Microalbumin / creatinine urine ratio   Partially dictated using Editor, commissioning. Any errors are unintentional.  Halina Maidens, MD [facility]  05/24/2018

## 2018-05-25 LAB — MICROALBUMIN / CREATININE URINE RATIO
Creatinine, Urine: 191.1 mg/dL
Microalb/Creat Ratio: 337 mg/g creat — ABNORMAL HIGH (ref 0–29)
Microalbumin, Urine: 644 ug/mL

## 2018-05-28 ENCOUNTER — Ambulatory Visit: Payer: Commercial Managed Care - PPO | Admitting: Nurse Practitioner

## 2018-05-28 ENCOUNTER — Other Ambulatory Visit: Payer: Self-pay | Admitting: Internal Medicine

## 2018-05-28 DIAGNOSIS — I10 Essential (primary) hypertension: Secondary | ICD-10-CM

## 2018-05-28 DIAGNOSIS — E785 Hyperlipidemia, unspecified: Secondary | ICD-10-CM

## 2018-05-28 DIAGNOSIS — M1000 Idiopathic gout, unspecified site: Secondary | ICD-10-CM

## 2018-06-14 ENCOUNTER — Ambulatory Visit (INDEPENDENT_AMBULATORY_CARE_PROVIDER_SITE_OTHER): Payer: Commercial Managed Care - PPO

## 2018-06-14 ENCOUNTER — Other Ambulatory Visit (INDEPENDENT_AMBULATORY_CARE_PROVIDER_SITE_OTHER): Payer: Commercial Managed Care - PPO

## 2018-06-14 VITALS — BP 129/92 | HR 97 | Ht 71.0 in | Wt 297.0 lb

## 2018-06-14 DIAGNOSIS — I4891 Unspecified atrial fibrillation: Secondary | ICD-10-CM | POA: Diagnosis not present

## 2018-06-14 NOTE — Progress Notes (Signed)
1.) Reason for visit:EKG nurse visit. Patient is scheduled for a DCCV on 06/17/18. Dx AFIB.   2.) Name of MD requesting visit: Ida Rogue, MD  3.) H&P: Patient reports feeling much better since his medication change on 05/18/18. He is currently asymptomatic and he is able to tell that his heart rate is better controlled. He reports that he has not missed any doses of his Xarelto, and he denies any signs of bleeding. He reports being compliant with all his prescribed medications and he has taken his morning medications prior to his office visit. Advised the patient to report any missed doses of Xarelto prior to his DCCV asap. Patient requested another copy of his written pre-procedure instructions. Patient given verbal pre-procedure instructions, the patient questions were answered. Written instructions also provided to the patient.   ROS related to problem: patient denies palpitation , sob, chest pain, swelling, pre-syncope or syncope. He denies fatigue and is anxious to start a exercise regimen when given the ok to do so.  4.) Assessment and plan per MD: Patient's EKG has been reviewed by Dr.Gollan. The EKG demonstrates Atrial fibrillation with a HR of 97bpm. The plan is to proceed with the previously scheduled DCCV on 06/17/18.

## 2018-06-15 LAB — CBC WITH DIFFERENTIAL/PLATELET
Basophils Absolute: 0.1 10*3/uL (ref 0.0–0.2)
Basos: 1 %
EOS (ABSOLUTE): 0.3 10*3/uL (ref 0.0–0.4)
Eos: 2 %
Hematocrit: 44.7 % (ref 37.5–51.0)
Hemoglobin: 14.5 g/dL (ref 13.0–17.7)
IMMATURE GRANS (ABS): 0.1 10*3/uL (ref 0.0–0.1)
IMMATURE GRANULOCYTES: 1 %
LYMPHS: 22 %
Lymphocytes Absolute: 2.7 10*3/uL (ref 0.7–3.1)
MCH: 25.3 pg — ABNORMAL LOW (ref 26.6–33.0)
MCHC: 32.4 g/dL (ref 31.5–35.7)
MCV: 78 fL — ABNORMAL LOW (ref 79–97)
Monocytes Absolute: 1 10*3/uL — ABNORMAL HIGH (ref 0.1–0.9)
Monocytes: 8 %
NEUTROS PCT: 66 %
Neutrophils Absolute: 8.3 10*3/uL — ABNORMAL HIGH (ref 1.4–7.0)
Platelets: 283 10*3/uL (ref 150–450)
RBC: 5.72 x10E6/uL (ref 4.14–5.80)
RDW: 16.4 % — ABNORMAL HIGH (ref 11.6–15.4)
WBC: 12.4 10*3/uL — ABNORMAL HIGH (ref 3.4–10.8)

## 2018-06-15 LAB — BASIC METABOLIC PANEL
BUN/Creatinine Ratio: 12 (ref 9–20)
BUN: 18 mg/dL (ref 6–24)
CALCIUM: 9.1 mg/dL (ref 8.7–10.2)
CO2: 20 mmol/L (ref 20–29)
Chloride: 101 mmol/L (ref 96–106)
Creatinine, Ser: 1.56 mg/dL — ABNORMAL HIGH (ref 0.76–1.27)
GFR calc Af Amer: 56 mL/min/{1.73_m2} — ABNORMAL LOW (ref >59)
GFR calc non Af Amer: 48 mL/min/{1.73_m2} — ABNORMAL LOW (ref >59)
Glucose: 110 mg/dL — ABNORMAL HIGH (ref 65–99)
Potassium: 4.6 mmol/L (ref 3.5–5.2)
Sodium: 137 mmol/L (ref 134–144)

## 2018-06-16 ENCOUNTER — Other Ambulatory Visit: Payer: Self-pay | Admitting: Cardiovascular Disease

## 2018-06-17 ENCOUNTER — Other Ambulatory Visit: Payer: Self-pay

## 2018-06-17 ENCOUNTER — Ambulatory Visit
Admission: RE | Admit: 2018-06-17 | Discharge: 2018-06-17 | Disposition: A | Discharge status: Home / Self Care | Payer: Commercial Managed Care - PPO | Attending: Cardiovascular Disease | Admitting: Cardiovascular Disease

## 2018-06-17 ENCOUNTER — Ambulatory Visit: Payer: Commercial Managed Care - PPO | Admitting: Anesthesiology

## 2018-06-17 ENCOUNTER — Encounter
Admission: RE | Disposition: A | Discharge status: Home / Self Care | Payer: Self-pay | Source: Home / Self Care | Attending: Cardiovascular Disease

## 2018-06-17 DIAGNOSIS — I4819 Other persistent atrial fibrillation: Secondary | ICD-10-CM | POA: Diagnosis not present

## 2018-06-17 DIAGNOSIS — E1122 Type 2 diabetes mellitus with diabetic chronic kidney disease: Secondary | ICD-10-CM | POA: Insufficient documentation

## 2018-06-17 DIAGNOSIS — Z79899 Other long term (current) drug therapy: Secondary | ICD-10-CM | POA: Diagnosis not present

## 2018-06-17 DIAGNOSIS — Z7901 Long term (current) use of anticoagulants: Secondary | ICD-10-CM | POA: Insufficient documentation

## 2018-06-17 DIAGNOSIS — E7800 Pure hypercholesterolemia, unspecified: Secondary | ICD-10-CM | POA: Insufficient documentation

## 2018-06-17 DIAGNOSIS — I129 Hypertensive chronic kidney disease with stage 1 through stage 4 chronic kidney disease, or unspecified chronic kidney disease: Secondary | ICD-10-CM | POA: Diagnosis not present

## 2018-06-17 DIAGNOSIS — N183 Chronic kidney disease, stage 3 (moderate): Secondary | ICD-10-CM | POA: Diagnosis not present

## 2018-06-17 DIAGNOSIS — R0602 Shortness of breath: Secondary | ICD-10-CM | POA: Diagnosis not present

## 2018-06-17 DIAGNOSIS — Z7984 Long term (current) use of oral hypoglycemic drugs: Secondary | ICD-10-CM | POA: Insufficient documentation

## 2018-06-17 DIAGNOSIS — I4891 Unspecified atrial fibrillation: Secondary | ICD-10-CM | POA: Diagnosis not present

## 2018-06-17 DIAGNOSIS — F17210 Nicotine dependence, cigarettes, uncomplicated: Secondary | ICD-10-CM | POA: Insufficient documentation

## 2018-06-17 HISTORY — PX: CARDIOVERSION: EP1203

## 2018-06-17 LAB — GLUCOSE, CAPILLARY: GLUCOSE-CAPILLARY: 136 mg/dL — AB (ref 70–99)

## 2018-06-17 SURGERY — CARDIOVERSION (CATH LAB)
Anesthesia: General

## 2018-06-17 MED ORDER — PROPOFOL 10 MG/ML IV BOLUS
INTRAVENOUS | Status: AC
  Filled 2018-06-17: qty 40

## 2018-06-17 MED ORDER — PROPOFOL 10 MG/ML IV BOLUS
INTRAVENOUS | Status: DC | PRN
  Administered 2018-06-17: 70 mg via INTRAVENOUS

## 2018-06-17 NOTE — Transfer of Care (Signed)
Immediate Anesthesia Transfer of Care Note  Patient: Jon Mendez  Procedure(s) Performed: CARDIOVERSION (CATH LAB) (N/A )  Patient Location: PACU  Anesthesia Type:General  Level of Consciousness: awake and alert   Airway & Oxygen Therapy: Patient Spontanous Breathing and Patient connected to nasal cannula oxygen  Post-op Assessment: Report given to RN and Post -op Vital signs reviewed and stable  Post vital signs: Reviewed and stable  Last Vitals:  Vitals Value Taken Time  BP    Temp    Pulse    Resp    SpO2      Last Pain:  Vitals:   06/17/18 0830  TempSrc:   PainSc: 0-No pain         Complications: No apparent anesthesia complications

## 2018-06-17 NOTE — H&P (Signed)
H&P Addendum, pre-cardioversion  Patient was seen and evaluated prior to -cardioversion procedure Symptoms, prior testing details again confirmed with the patient Patient examined, no significant change from prior exam Lab work reviewed in detail personally by myself Patient understands risk and benefit of the procedure, willing to proceed  Signed, Esmond Plants, MD, Ph.D [facility]

## 2018-06-17 NOTE — Anesthesia Preprocedure Evaluation (Signed)
Anesthesia Evaluation  Patient identified by MRN, date of birth, ID band  Reviewed: Allergy & Precautions, H&P , NPO status , Patient's Chart, lab work & pertinent test results  Airway Mallampati: III  TM Distance: >3 FB Neck ROM: full    Dental  (+) Poor Dentition   Pulmonary Current Smoker,    Pulmonary exam normal        Cardiovascular hypertension,  Rhythm:regular Rate:Normal     Neuro/Psych    GI/Hepatic   Endo/Other  diabetes  Renal/GU Renal InsufficiencyRenal disease     Musculoskeletal   Abdominal   Peds  Hematology   Anesthesia Other Findings   Reproductive/Obstetrics                             Anesthesia Physical  Anesthesia Plan  ASA: III  Anesthesia Plan: General   Post-op Pain Management:    Induction: Intravenous  PONV Risk Score and Plan:   Airway Management Planned: Nasal Cannula  Additional Equipment:   Intra-op Plan:   Post-operative Plan:   Informed Consent: I have reviewed the patients History and Physical, chart, labs and discussed the procedure including the risks, benefits and alternatives for the proposed anesthesia with the patient or authorized representative who has indicated his/her understanding and acceptance.       Plan Discussed with: CRNA  Anesthesia Plan Comments:         Anesthesia Quick Evaluation

## 2018-06-17 NOTE — Anesthesia Post-op Follow-up Note (Signed)
Anesthesia QCDR form completed.        

## 2018-06-17 NOTE — Discharge Instructions (Signed)
Electrical Cardioversion, Care After This sheet gives you information about how to care for yourself after your procedure. Your health care provider may also give you more specific instructions. If you have problems or questions, contact your health care provider. What can I expect after the procedure? After the procedure, it is common to have:  Some redness on the skin where the shocks were given. Follow these instructions at home:   Do not drive for 24 hours if you were given a medicine to help you relax (sedative).  Take over-the-counter and prescription medicines only as told by your health care provider.  Ask your health care provider how to check your pulse. Check it often.  Rest for 48 hours after the procedure or as told by your health care provider.  Avoid or limit your caffeine use as told by your health care provider. Contact a health care provider if:  You feel like your heart is beating too quickly or your pulse is not regular.  You have a serious muscle cramp that does not go away. Get help right away if:   You have discomfort in your chest.  You are dizzy or you feel faint.  You have trouble breathing or you are short of breath.  Your speech is slurred.  You have trouble moving an arm or leg on one side of your body.  Your fingers or toes turn cold or blue. This information is not intended to replace advice given to you by your health care provider. Make sure you discuss any questions you have with your health care provider. Document Released: 01/12/2013 Document Revised: 10/26/2015 Document Reviewed: 09/28/2015 Elsevier Interactive Patient Education  2019 [place]. Moderate Conscious Sedation, Adult, Care After These instructions provide you with information about caring for yourself after your procedure. Your health care provider may also give you more specific instructions. Your treatment has been planned according to current medical practices, but  problems sometimes occur. Call your health care provider if you have any problems or questions after your procedure. What can I expect after the procedure? After your procedure, it is common:  To feel sleepy for several hours.  To feel clumsy and have poor balance for several hours.  To have poor judgment for several hours.  To vomit if you eat too soon. Follow these instructions at home: For at least 24 hours after the procedure:   Do not: ? Participate in activities where you could fall or become injured. ? Drive. ? Use heavy machinery. ? Drink alcohol. ? Take sleeping pills or medicines that cause drowsiness. ? Make important decisions or sign legal documents. ? Take care of children on your own.  Rest. Eating and drinking  Follow the diet recommended by your health care provider.  If you vomit: ? Drink water, juice, or soup when you can drink without vomiting. ? Make sure you have little or no nausea before eating solid foods. General instructions  Have a responsible adult stay with you until you are awake and alert.  Take over-the-counter and prescription medicines only as told by your health care provider.  If you smoke, do not smoke without supervision.  Keep all follow-up visits as told by your health care provider. This is important. Contact a health care provider if:  You keep feeling nauseous or you keep vomiting.  You feel light-headed.  You develop a rash.  You have a fever. Get help right away if:  You have trouble breathing. This information is not intended  to replace advice given to you by your health care provider. Make sure you discuss any questions you have with your health care provider. Document Released: 01/12/2013 Document Revised: 08/27/2015 Document Reviewed: 07/14/2015 Elsevier Interactive Patient Education  2019 [employer].

## 2018-06-17 NOTE — CV Procedure (Signed)
Cardioversion procedure note For atrial fibrillation, persistent  Procedure Details:  Consent: Risks of procedure as well as the alternatives and risks of each were explained to the (patient/caregiver). Consent for procedure obtained.  Time Out: Verified patient identification, verified procedure, site/side was marked, verified correct patient position, special equipment/implants available, medications/allergies/relevent history reviewed, required imaging and test results available. Performed  Patient placed on cardiac monitor, pulse oximetry, supplemental oxygen as necessary.  Sedation given: propofol IV, Dr. Kayleen Memos Pacer pads placed anterior and posterior chest.   Cardioverted 1 time(s).  Cardioverted at  150 J. Synchronized biphasic Converted to NSR   Evaluation: Findings: Post procedure EKG shows: NSR Complications: None Patient did tolerate procedure well.  Time Spent Directly with the Patient:  24 minutes   Esmond Plants, M.D., Ph.D.

## 2018-06-22 NOTE — Anesthesia Postprocedure Evaluation (Signed)
Anesthesia Post Note  Patient: Jon Mendez  Procedure(s) Performed: CARDIOVERSION (CATH LAB) (N/A )  Patient location during evaluation: PACU Anesthesia Type: General Level of consciousness: awake and alert and oriented Pain management: pain level controlled Vital Signs Assessment: post-procedure vital signs reviewed and stable Respiratory status: spontaneous breathing Cardiovascular status: blood pressure returned to baseline Anesthetic complications: no     Last Vitals:  Vitals:   06/17/18 0815 06/17/18 0830  BP: 120/75 132/78  Pulse: 73 67  Resp: (!) 22 (!) 22  Temp:    SpO2: 98% 99%    Last Pain:  Vitals:   06/17/18 0830  TempSrc:   PainSc: 0-No pain                 Tuwana Kapaun

## 2018-06-24 ENCOUNTER — Other Ambulatory Visit: Payer: Self-pay

## 2018-06-24 ENCOUNTER — Ambulatory Visit: Payer: Commercial Managed Care - PPO | Admitting: Internal Medicine

## 2018-06-24 ENCOUNTER — Encounter: Payer: Self-pay | Admitting: Internal Medicine

## 2018-06-24 ENCOUNTER — Telehealth: Payer: Self-pay

## 2018-06-24 VITALS — BP 124/78 | HR 86 | Ht 71.0 in | Wt 285.0 lb

## 2018-06-24 DIAGNOSIS — E1122 Type 2 diabetes mellitus with diabetic chronic kidney disease: Secondary | ICD-10-CM

## 2018-06-24 DIAGNOSIS — I4891 Unspecified atrial fibrillation: Secondary | ICD-10-CM

## 2018-06-24 DIAGNOSIS — N183 Chronic kidney disease, stage 3 unspecified: Secondary | ICD-10-CM

## 2018-06-24 DIAGNOSIS — I10 Essential (primary) hypertension: Secondary | ICD-10-CM | POA: Diagnosis not present

## 2018-06-24 MED ORDER — LISINOPRIL 5 MG PO TABS: 5.0000 mg | ORAL_TABLET | Freq: Every day | ORAL | 1 refills | Status: DC

## 2018-06-24 NOTE — Telephone Encounter (Signed)
-----   Message from Minna Merritts, MD sent at 06/24/2018 10:18 AM EDT ----- Regarding: atrial fib Received a note from primary care Dr. Army Melia that he is back in atrial fibrillation EKG reviewed by myself confirms atrial fibrillation High risk of recurrent arrhythmia given body habitus and possible sleep apnea Would recommend he start amiodarone 400 twice daily for 2 weeks then down to 200 twice daily EKG in 3 weeks time If still in atrial fibrillation could consider repeat cardioversion Would be more likely to hold with antiarrhythmic on board Continue other medications Stay on anticoagulation Thx TG

## 2018-06-24 NOTE — Telephone Encounter (Signed)
Received advise from Dr. Rockey Situ for patient. I attempted to call patient to make him aware of advise. He was unavailable at this time. Left message with family member to return call.

## 2018-06-24 NOTE — Progress Notes (Signed)
Date:  06/24/2018   Name:  Jon Mendez   DOB:  03/23/1960   MRN:  [id number]   Chief Complaint: Diabetes (6 month follow up. ) and Hypertension  Diabetes  He presents for his follow-up diabetic visit. He has type 2 diabetes mellitus. His disease course has been stable. Pertinent negatives for hypoglycemia include no headaches or tremors. Pertinent negatives for diabetes include no chest pain, no fatigue, no polydipsia and no polyuria. Current diabetic treatment includes oral agent (monotherapy). He is compliant with treatment all of the time. An ACE inhibitor/angiotensin II receptor blocker is not being taken.  Hypertension  This is a chronic problem. The problem is controlled. Pertinent negatives include no chest pain, headaches, palpitations or shortness of breath. The current treatment provides significant improvement.  Atrial Fibrillation - recent cardioversion went well.  He feels well and has not had rapid heart beat.  On closer questioning he admits that he has some rapid heart beat but no lightheadedness.  Lab Results  Component Value Date   HGBA1C 6.5 (H) 05/04/2018    Review of Systems  Constitutional: Negative for appetite change, fatigue and unexpected weight change.  HENT: Negative for postnasal drip.   Eyes: Negative for visual disturbance.  Respiratory: Negative for cough, chest tightness, shortness of breath and wheezing.   Cardiovascular: Negative for chest pain, palpitations and leg swelling.  Gastrointestinal: Negative for abdominal pain and blood in stool.  Endocrine: Negative for polydipsia and polyuria.  Genitourinary: Negative for dysuria and hematuria.  Musculoskeletal: Negative for arthralgias.  Skin: Negative for color change and rash.  Neurological: Negative for tremors, numbness and headaches.  Hematological: Negative for adenopathy.  Psychiatric/Behavioral: Negative for dysphoric mood and sleep disturbance.    Patient Active Problem List   Diagnosis Date Noted  . New onset atrial fibrillation ([place]) 05/03/2018  . Bronchitis 05/03/2018  . Controlled type 2 diabetes mellitus with stage 3 chronic kidney disease, without long-term current use of insulin ([place]) 09/19/2015  . Tubular adenoma of colon   . Chronic renal insufficiency, stage III (moderate) ([place]) 04/25/2015  . Chronic gouty arthritis 11/22/2014  . Hyperlipidemia associated with type 2 diabetes mellitus ([place]) 11/22/2014  . Essential (primary) hypertension 11/22/2014  . Compulsive tobacco user syndrome 11/22/2014  . Varicose veins of both lower extremities 11/22/2014  . Hidradenitis suppurativa 09/06/2012  . Benign paroxysmal positional nystagmus 07/29/2004    Allergies  Allergen Reactions  . Eggs Or Egg-Derived Products Shortness Of Breath    Throat swells. Patient able to eat products that contain egg just not whole egg alone.  . Fish Allergy Shortness Of Breath    Throat swells    Past Surgical History:  Procedure Laterality Date  . CARDIOVERSION N/A 06/17/2018   Procedure: CARDIOVERSION (CATH LAB);  Surgeon: Minna Merritts, MD;  Location: [facility] ORS;  Service: Cardiovascular;  Laterality: N/A;  . COLONOSCOPY WITH PROPOFOL N/A 06/04/2015   Procedure: COLONOSCOPY WITH PROPOFOL;  Surgeon: Lucilla Lame, MD;  5 tubular adenomas  . HIP ARTHROPLASTY Left 2013  . LEG SKIN LESION  BIOPSY / EXCISION Right 2015   Hidradenitis  . POLYPECTOMY  06/04/2015   Procedure: POLYPECTOMY;  Surgeon: Lucilla Lame, MD;  Location: [facility];  Service: Endoscopy;;  . VARICOSE VEIN SURGERY Bilateral 2015    Social History   Tobacco Use  . Smoking status: Current Every Day Smoker    Packs/day: 0.25    Years: 30.00    Pack years: 7.50    Types:  Cigarettes  . Smokeless tobacco: Never Used  . Tobacco comment: cutting back- [id number]  Substance Use Topics  . Alcohol use: No    Alcohol/week: 4.0 standard drinks    Types: 4 Standard drinks or equivalent per week  . Drug  use: No     Medication list has been reviewed and updated.  Current Meds  Medication Sig  . allopurinol (ZYLOPRIM) 100 MG tablet TAKE 1 TABLET BY MOUTH EVERY DAY (Patient taking differently: Take 100 mg by mouth daily. )  . amLODipine (NORVASC) 5 MG tablet TAKE 1 TABLET BY MOUTH EVERY DAY (Patient taking differently: Take 5 mg by mouth daily. )  . diltiazem (CARDIZEM) 30 MG tablet Take 1 tablet (30 mg total) by mouth 2 (two) times daily.  Marland Kitchen glucose blood (FREESTYLE LITE) test strip Use as instructed  . JANUMET XR [id number] MG TB24 TAKE 1 TABLET BY MOUTH EVERY DAY (Patient taking differently: Take 1 tablet by mouth daily. )  . Lancets (FREESTYLE) lancets Use as instructed  . Multiple Vitamin (MULTIVITAMIN WITH MINERALS) TABS tablet Take 1 tablet by mouth daily. Complete Multivitamin  . pravastatin (PRAVACHOL) 40 MG tablet TAKE 1 TABLET BY MOUTH EVERYDAY AT BEDTIME (Patient taking differently: Take 40 mg by mouth daily. )    PHQ 2/9 Scores 06/24/2018 05/24/2018 12/24/2017 04/28/2016  PHQ - 2 Score 0 0 0 0    Physical Exam Vitals signs and nursing note reviewed.  Constitutional:      General: He is not in acute distress.    Appearance: He is well-developed.  HENT:     Head: Normocephalic and atraumatic.  Neck:     Musculoskeletal: Normal range of motion and neck supple.  Cardiovascular:     Rate and Rhythm: Rhythm irregular.     Pulses: Normal pulses.     Heart sounds: Normal heart sounds.  Pulmonary:     Effort: Pulmonary effort is normal. No respiratory distress.     Breath sounds: Normal breath sounds. No wheezing or rales.  Abdominal:     General: Bowel sounds are normal.     Palpations: Abdomen is soft.     Tenderness: There is no abdominal tenderness.  Musculoskeletal: Normal range of motion.     Right lower leg: No edema.     Left lower leg: No edema.  Skin:    General: Skin is warm and dry.     Findings: No rash.  Neurological:     Mental Status: He is alert and  oriented to person, place, and time.  Psychiatric:        Behavior: Behavior normal.        Thought Content: Thought content normal.     Wt Readings from Last 3 Encounters:  06/24/18 285 lb (129.3 kg)  06/17/18 280 lb (127 kg)  06/14/18 297 lb (134.7 kg)    BP 124/78   Pulse 86   Ht 5\' 11"  (1.803 m)   Wt 285 lb (129.3 kg)   SpO2 98%   BMI 39.75 kg/m   Assessment and Plan: 1. Controlled type 2 diabetes mellitus with stage 3 chronic kidney disease, without long-term current use of insulin ([facility]) Start ACE for microalbuminuria Continue Janumet Recent A1C good - lisinopril (PRINIVIL,ZESTRIL) 5 MG tablet; Take 1 tablet (5 mg total) by mouth daily.  Dispense: 90 tablet; Refill: 1  2. New onset atrial fibrillation ([place]) Continue cardiazem and Xarelto Cardiology messaged and will schedule pt for follow up - EKG 12-Lead - Afib @ 89  3. Essential (primary) hypertension controlled   Partially dictated using Editor, commissioning. Any errors are unintentional.  Halina Maidens, MD [facility]  06/24/2018

## 2018-06-28 MED ORDER — AMIODARONE HCL 200 MG PO TABS: 200.0000 mg | ORAL_TABLET | Freq: Two times a day (BID) | ORAL | 0 refills | Status: DC

## 2018-06-28 NOTE — Telephone Encounter (Signed)
Incoming call from patient, I gave him informations regarding a fib suggestions from Dr. Rockey Situ.   Pt verbalized understanding. Rx sent to preferred pharmacy.   EKG RN visit scheduled:  4/13@ 9:30A.  Encouraged pt to call back with questions or concerns.   Routed to provider as Juluis Rainier.

## 2018-07-16 ENCOUNTER — Telehealth: Payer: Self-pay | Admitting: Cardiovascular Disease

## 2018-07-16 DIAGNOSIS — I4891 Unspecified atrial fibrillation: Secondary | ICD-10-CM

## 2018-07-16 NOTE — Telephone Encounter (Signed)
Spoke with patient and reviewed that we need to have him go to [facility] on Monday for that EKG due to current COVID 19 crisis. Reviewed that they will screen him once entering and then he will check in for the EKG. He verbalized understanding, was agreeable, and had no further questions at this time. Advised that we would call him with any further recommendations.

## 2018-07-19 ENCOUNTER — Ambulatory Visit: Payer: Commercial Managed Care - PPO
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
## 2018-07-20 NOTE — Telephone Encounter (Signed)
Spoke with patients wife and she requested that I please call back in a little bit because she is currently in the shower. Advised I would call back in a little while.

## 2018-07-22 NOTE — Telephone Encounter (Signed)
I attempted to call the patient to follow up if his EKG had been done at [facility].  The person answering the phone was his mother in law- she advised he was not available and would be back in ~ an hour or so.

## 2018-07-23 ENCOUNTER — Ambulatory Visit
Admission: RE | Admit: 2018-07-23 | Discharge: 2018-07-23 | Disposition: A | Discharge status: Home / Self Care | Payer: Commercial Managed Care - PPO | Source: Ambulatory Visit | Attending: Cardiovascular Disease | Admitting: Cardiovascular Disease

## 2018-07-23 ENCOUNTER — Other Ambulatory Visit: Payer: Self-pay

## 2018-07-23 ENCOUNTER — Other Ambulatory Visit: Payer: Self-pay | Admitting: Cardiovascular Disease

## 2018-07-23 DIAGNOSIS — Z01810 Encounter for preprocedural cardiovascular examination: Secondary | ICD-10-CM | POA: Diagnosis not present

## 2018-07-23 DIAGNOSIS — R9431 Abnormal electrocardiogram [ECG] [EKG]: Secondary | ICD-10-CM | POA: Diagnosis not present

## 2018-07-23 DIAGNOSIS — I4891 Unspecified atrial fibrillation: Secondary | ICD-10-CM | POA: Diagnosis not present

## 2018-07-29 NOTE — Telephone Encounter (Signed)
Called to check in with patient and he was not there. His mother reported that he had EKG done on Friday 07/23/18. Will route message over to Dr. Rockey Situ so he can review those EKG images.

## 2018-07-29 NOTE — Telephone Encounter (Signed)
EKG shows NSR Would decrease the amiodarone down to 200 mg daily Stay on xarelto 20 daily

## 2018-07-30 NOTE — Telephone Encounter (Signed)
Spoke with patients mother and she reports that he is out of town at the moment and will not be back until over the weekend. She provided me with private number to try calling to reach him. Will reach out to him at that number.

## 2018-07-30 NOTE — Telephone Encounter (Signed)
Left voicemail message on private number for patient regarding some medication changes. Advised that I would call back later today and my number may not show up.

## 2018-07-30 NOTE — Telephone Encounter (Signed)
Left voicemail message for patient to please call back so that we can review medication changes. Advised that I would try to call back later and to please answer if there is no caller ID or private number listed.

## 2018-08-12 ENCOUNTER — Other Ambulatory Visit: Payer: Self-pay | Admitting: Internal Medicine

## 2018-08-12 DIAGNOSIS — I10 Essential (primary) hypertension: Secondary | ICD-10-CM

## 2018-08-19 MED ORDER — AMIODARONE HCL 200 MG PO TABS: 200.0000 mg | ORAL_TABLET | Freq: Every day | ORAL | 3 refills | Status: DC

## 2018-08-19 MED ORDER — RIVAROXABAN 20 MG PO TABS: 20.0000 mg | ORAL_TABLET | Freq: Every day | ORAL | 3 refills | Status: DC

## 2018-08-19 NOTE — Addendum Note (Signed)
Addended by: Valora Corporal on: 08/19/2018 10:46 AM   Modules accepted: Orders

## 2018-08-19 NOTE — Telephone Encounter (Addendum)
Spoke with patient and reviewed that I had been trying to reach him for some time now. Reviewed medication recommendations by provider to decrease Amiodarone to 1 tablet (200 mg) once daily and to stay on the Xarelto 20 mg once daily. He verbalized understanding of these recommendations, read back instructions, and had no further questions at this time. Updated prescription sent in to pharmacy.

## 2018-10-16 ENCOUNTER — Other Ambulatory Visit: Payer: Self-pay | Admitting: Cardiovascular Disease

## 2018-10-18 NOTE — Telephone Encounter (Signed)
Please review for refill on Amiodarone. I do not see on the clinical note for 06/14/2018.  Please advise if okay to refill. Thanks!

## 2018-10-18 NOTE — Telephone Encounter (Signed)
Jon Merritts, MD  Physician  Specialty:  Cardiology  Telephone Encounter  Signed  Encounter Date:  07/16/2018          Signed         EKG shows NSR Would decrease the amiodarone down to 200 mg daily Stay on xarelto 20 daily

## 2018-11-23 ENCOUNTER — Other Ambulatory Visit: Payer: Self-pay | Admitting: Internal Medicine

## 2018-12-27 ENCOUNTER — Other Ambulatory Visit: Payer: Self-pay

## 2018-12-27 DIAGNOSIS — E1122 Type 2 diabetes mellitus with diabetic chronic kidney disease: Secondary | ICD-10-CM

## 2018-12-27 DIAGNOSIS — N183 Chronic kidney disease, stage 3 unspecified: Secondary | ICD-10-CM

## 2018-12-27 MED ORDER — LISINOPRIL 5 MG PO TABS: 5.0000 mg | ORAL_TABLET | Freq: Every day | ORAL | 0 refills | Status: DC

## 2018-12-28 ENCOUNTER — Encounter: Payer: Commercial Managed Care - PPO | Admitting: Internal Medicine

## 2019-01-23 ENCOUNTER — Encounter: Payer: Self-pay | Admitting: Internal Medicine

## 2019-01-24 ENCOUNTER — Encounter: Payer: Commercial Managed Care - PPO | Admitting: Internal Medicine

## 2019-01-31 ENCOUNTER — Other Ambulatory Visit: Payer: Self-pay | Admitting: Internal Medicine

## 2019-01-31 ENCOUNTER — Encounter: Payer: Self-pay | Admitting: Internal Medicine

## 2019-01-31 ENCOUNTER — Other Ambulatory Visit: Payer: Self-pay

## 2019-01-31 ENCOUNTER — Ambulatory Visit (INDEPENDENT_AMBULATORY_CARE_PROVIDER_SITE_OTHER): Payer: Commercial Managed Care - PPO | Admitting: Internal Medicine

## 2019-01-31 VITALS — BP 132/70 | HR 75 | Ht 71.0 in | Wt 335.0 lb

## 2019-01-31 DIAGNOSIS — E785 Hyperlipidemia, unspecified: Secondary | ICD-10-CM

## 2019-01-31 DIAGNOSIS — E118 Type 2 diabetes mellitus with unspecified complications: Secondary | ICD-10-CM

## 2019-01-31 DIAGNOSIS — Z0000 Encounter for general adult medical examination without abnormal findings: Secondary | ICD-10-CM | POA: Diagnosis not present

## 2019-01-31 DIAGNOSIS — D126 Benign neoplasm of colon, unspecified: Secondary | ICD-10-CM | POA: Diagnosis not present

## 2019-01-31 DIAGNOSIS — Z125 Encounter for screening for malignant neoplasm of prostate: Secondary | ICD-10-CM

## 2019-01-31 DIAGNOSIS — I10 Essential (primary) hypertension: Secondary | ICD-10-CM

## 2019-01-31 DIAGNOSIS — I4891 Unspecified atrial fibrillation: Secondary | ICD-10-CM | POA: Diagnosis not present

## 2019-01-31 DIAGNOSIS — R040 Epistaxis: Secondary | ICD-10-CM

## 2019-01-31 DIAGNOSIS — E1169 Type 2 diabetes mellitus with other specified complication: Secondary | ICD-10-CM

## 2019-01-31 DIAGNOSIS — N529 Male erectile dysfunction, unspecified: Secondary | ICD-10-CM

## 2019-01-31 DIAGNOSIS — N1831 Chronic kidney disease, stage 3a: Secondary | ICD-10-CM

## 2019-01-31 LAB — POCT URINALYSIS DIPSTICK
Bilirubin, UA: NEGATIVE
Glucose, UA: NEGATIVE
Ketones, UA: NEGATIVE
Leukocytes, UA: NEGATIVE
Nitrite, UA: NEGATIVE
Protein, UA: POSITIVE — AB
Spec Grav, UA: 1.02 (ref 1.010–1.025)
Urobilinogen, UA: 0.2 E.U./dL
pH, UA: 6.5 (ref 5.0–8.0)

## 2019-01-31 MED ORDER — SILDENAFIL CITRATE 100 MG PO TABS: 50.0000 mg | ORAL_TABLET | Freq: Every day | ORAL | 0 refills | Status: DC | PRN

## 2019-01-31 NOTE — Patient Instructions (Addendum)
Stop Lisinopril 5 mg.    Check to be sure that you are taking Losartan-hct. Schedule Diabetic eye exam

## 2019-01-31 NOTE — Progress Notes (Signed)
Date:  01/31/2019   Name:  Jon Mendez   DOB:  03/23/1960   MRN:  [id number]   Chief Complaint: Annual Exam Jon Mendez is a 58 y.o. male who presents today for his Complete Annual Exam. He feels fairly well. He reports exercising none. He reports he is sleeping well.  He has gained 55 lbs since retiring and staying home with Covid.  Colonoscopy  05/2015 - repeat 3 yrs  Hypertension This is a chronic problem. The problem is controlled. Pertinent negatives include no chest pain, headaches, palpitations or shortness of breath. Past treatments include calcium channel blockers, angiotensin blockers and diuretics.  Diabetes Pertinent negatives for hypoglycemia include no dizziness, headaches or nervousness/anxiousness. Pertinent negatives for diabetes include no chest pain, no fatigue, no polydipsia and no polyuria.  Hyperlipidemia Pertinent negatives include no chest pain, myalgias or shortness of breath.  Atrial fibrillation - on multiple medications and Xarelto.  He complains of frequent nose bleeds - 1-2 times per week.  He wonders about another dose or medication.    Lab Results  Component Value Date   HGBA1C 6.5 (H) 05/04/2018   Lab Results  Component Value Date   CREATININE 1.56 (H) 06/14/2018   BUN 18 06/14/2018   NA 137 06/14/2018   K 4.6 06/14/2018   CL 101 06/14/2018   CO2 20 06/14/2018   Lab Results  Component Value Date   CHOL 195 12/24/2017   HDL 43 12/24/2017   LDLCALC 123 (H) 12/24/2017   TRIG 146 12/24/2017   CHOLHDL 4.5 12/24/2017    Review of Systems  Constitutional: Positive for unexpected weight change. Negative for appetite change, chills, diaphoresis and fatigue.  HENT: Positive for nosebleeds. Negative for hearing loss, tinnitus, trouble swallowing and voice change.   Eyes: Negative for visual disturbance.  Respiratory: Negative for choking, shortness of breath and wheezing.   Cardiovascular: Negative for chest pain, palpitations and  leg swelling.  Gastrointestinal: Negative for abdominal pain, blood in stool, constipation and diarrhea.  Endocrine: Negative for polydipsia and polyuria.  Genitourinary: Negative for difficulty urinating, dysuria, frequency and hematuria.       Mild ED - has used viagra in the past  Musculoskeletal: Negative for arthralgias, back pain and myalgias.  Skin: Negative for color change and rash.  Allergic/Immunologic: Negative for environmental allergies.  Neurological: Negative for dizziness, syncope and headaches.  Hematological: Negative for adenopathy.  Psychiatric/Behavioral: Negative for dysphoric mood and sleep disturbance. The patient is not nervous/anxious.     Patient Active Problem List   Diagnosis Date Noted  . New onset atrial fibrillation ([place]) 05/03/2018  . Bronchitis 05/03/2018  . Type II diabetes mellitus with complication ([place]) 78/46/9629  . Tubular adenoma of colon   . CKD stage G3a/A2, GFR 45-59 and albumin creatinine ratio 30-299 mg/g 04/25/2015  . Chronic gouty arthritis 11/22/2014  . Hyperlipidemia associated with type 2 diabetes mellitus ([place]) 11/22/2014  . Essential (primary) hypertension 11/22/2014  . Compulsive tobacco user syndrome 11/22/2014  . Varicose veins of both lower extremities 11/22/2014  . Hidradenitis suppurativa 09/06/2012  . Benign paroxysmal positional nystagmus 07/29/2004    Allergies  Allergen Reactions  . Eggs Or Egg-Derived Products Shortness Of Breath    Throat swells. Patient able to eat products that contain egg just not whole egg alone.  . Fish Allergy Shortness Of Breath    Throat swells    Past Surgical History:  Procedure Laterality Date  . CARDIOVERSION N/A 06/17/2018   Procedure: CARDIOVERSION (  CATH LAB);  Surgeon: Minna Merritts, MD;  Location: [facility] ORS;  Service: Cardiovascular;  Laterality: N/A;  . COLONOSCOPY WITH PROPOFOL N/A 06/04/2015   Procedure: COLONOSCOPY WITH PROPOFOL;  Surgeon: Lucilla Lame, MD;  5 tubular  adenomas  . HIP ARTHROPLASTY Left 2013  . LEG SKIN LESION  BIOPSY / EXCISION Right 2015   Hidradenitis  . POLYPECTOMY  06/04/2015   Procedure: POLYPECTOMY;  Surgeon: Lucilla Lame, MD;  Location: [facility];  Service: Endoscopy;;  . VARICOSE VEIN SURGERY Bilateral 2015    Social History   Tobacco Use  . Smoking status: Current Every Day Smoker    Packs/day: 0.50    Years: 30.00    Pack years: 15.00    Types: Cigarettes  . Smokeless tobacco: Never Used  Substance Use Topics  . Alcohol use: No    Alcohol/week: 4.0 standard drinks    Types: 4 Standard drinks or equivalent per week  . Drug use: No     Medication list has been reviewed and updated.  Current Meds  Medication Sig  . amiodarone (PACERONE) 200 MG tablet Take 1 tablet (200 mg total) by mouth daily.  Marland Kitchen diltiazem (CARDIZEM) 30 MG tablet Take 1 tablet (30 mg total) by mouth 2 (two) times daily.  Marland Kitchen glucose blood (FREESTYLE LITE) test strip Use as instructed  . JANUMET XR [phone] MG TB24 TAKE 1 TABLET BY MOUTH EVERY DAY  . Lancets (FREESTYLE) lancets Use as instructed  . Multiple Vitamin (MULTIVITAMIN WITH MINERALS) TABS tablet Take 1 tablet by mouth daily. Complete Multivitamin  . pravastatin (PRAVACHOL) 40 MG tablet TAKE 1 TABLET BY MOUTH EVERYDAY AT BEDTIME (Patient taking differently: Take 40 mg by mouth daily. )  . rivaroxaban (XARELTO) 20 MG TABS tablet Take 1 tablet (20 mg total) by mouth daily.    PHQ 2/9 Scores 06/24/2018 05/24/2018 12/24/2017 04/28/2016  PHQ - 2 Score 0 0 0 0    BP Readings from Last 3 Encounters:  01/31/19 132/70  06/24/18 124/78  06/17/18 132/78    Physical Exam Vitals signs and nursing note reviewed.  Constitutional:      Appearance: Normal appearance. He is well-developed.  HENT:     Head: Normocephalic.     Right Ear: Tympanic membrane, ear canal and external ear normal.     Left Ear: Tympanic membrane, ear canal and external ear normal.     Nose:     Right Nostril:  Epistaxis present.     Left Nostril: Epistaxis present.     Mouth/Throat:     Pharynx: Uvula midline.  Eyes:     Conjunctiva/sclera: Conjunctivae normal.     Pupils: Pupils are equal, round, and reactive to light.  Neck:     Musculoskeletal: Normal range of motion and neck supple.     Thyroid: No thyromegaly.     Vascular: No carotid bruit.  Cardiovascular:     Rate and Rhythm: Normal rate and regular rhythm.     Heart sounds: Normal heart sounds.  Pulmonary:     Effort: Pulmonary effort is normal.     Breath sounds: Normal breath sounds. No wheezing.  Chest:     Breasts:        Right: No mass.        Left: No mass.  Abdominal:     General: Bowel sounds are normal.     Palpations: Abdomen is soft.     Tenderness: There is no abdominal tenderness.  Musculoskeletal: Normal range of motion.  Lymphadenopathy:     Cervical: No cervical adenopathy.  Skin:    General: Skin is warm and dry.  Neurological:     Mental Status: He is alert and oriented to person, place, and time.     Deep Tendon Reflexes: Reflexes are normal and symmetric.  Psychiatric:        Speech: Speech normal.        Behavior: Behavior normal.        Thought Content: Thought content normal.        Judgment: Judgment normal.     Wt Readings from Last 3 Encounters:  01/31/19 (!) 335 lb (152 kg)  06/24/18 285 lb (129.3 kg)  06/17/18 280 lb (127 kg)    BP 132/70   Pulse 75   Ht 5\' 11"  (1.803 m)   Wt (!) 335 lb (152 kg)   SpO2 97%   BMI 46.72 kg/m   Assessment and Plan: 1. Annual physical exam Normal exam except for massive weight gain Pt counseled on exercise and dietary changes - POCT urinalysis dipstick  2. Type II diabetes mellitus with complication (HCC) Clinically stable by exam and report without s/s of hypoglycemia. DM complicated by htn, lipid and obesity. Tolerating medications metformin and januvia well without side effects or other concerns. He is due for DM eye exam. - Comprehensive  metabolic panel - Hemoglobin A1c  3. Hyperlipidemia associated with type 2 diabetes mellitus (HCC) Tolerating moderate intensity statin medication without side effects at this time LDL is not at goal of < 70 on current dose Continue same therapy - consider change to high intensity statin - Lipid panel  4. Essential (primary) hypertension Clinically stable exam with well controlled BP.   Tolerating medications, amlodipine, losartan, HCT, metoprolol and cardiazam, without side effects at this time.  Stopped Lisinopril.  Cardiology aware of amlodipine and cardiazem dosing. Pt to continue current regimen and low sodium diet; benefits of regular exercise as able discussed. - CBC with Differential/Platelet  5. New onset atrial fibrillation (HCC) In SR today. Continue DOAC - ongoing epistaxis a concern  Stopping anticoagulation is not recommended so will refer to ENT  6. Tubular adenoma of colon Due for 3 yr colonoscopy - Ambulatory referral to Gastroenterology  7. CKD stage G3a/A2, GFR 45-59 and albumin creatinine ratio 30-299 mg/g - Comprehensive metabolic panel  8. Epistaxis, recurrent - Ambulatory referral to ENT  9. Erectile dysfunction, unspecified erectile dysfunction type - sildenafil (VIAGRA) 100 MG tablet; Take 0.5-1 tablets (50-100 mg total) by mouth daily as needed for erectile dysfunction.  Dispense: 10 tablet; Refill: 0  10. Prostate cancer screening DRE deferred - PSA   Partially dictated using Dragon software. Any errors are unintentional.  Halina Maidens, MD [facility]  01/31/2019

## 2019-02-01 LAB — CBC WITH DIFFERENTIAL/PLATELET
Basophils Absolute: 0.1 10*3/uL (ref 0.0–0.2)
Basos: 1 %
EOS (ABSOLUTE): 0.2 10*3/uL (ref 0.0–0.4)
Eos: 2 %
Hematocrit: 40.9 % (ref 37.5–51.0)
Hemoglobin: 13.5 g/dL (ref 13.0–17.7)
Immature Grans (Abs): 0 10*3/uL (ref 0.0–0.1)
Immature Granulocytes: 0 %
Lymphocytes Absolute: 2.2 10*3/uL (ref 0.7–3.1)
Lymphs: 17 %
MCH: 25.9 pg — ABNORMAL LOW (ref 26.6–33.0)
MCHC: 33 g/dL (ref 31.5–35.7)
MCV: 79 fL (ref 79–97)
Monocytes Absolute: 1.2 10*3/uL — ABNORMAL HIGH (ref 0.1–0.9)
Monocytes: 9 %
Neutrophils Absolute: 9 10*3/uL — ABNORMAL HIGH (ref 1.4–7.0)
Neutrophils: 71 %
Platelets: 263 10*3/uL (ref 150–450)
RBC: 5.21 x10E6/uL (ref 4.14–5.80)
RDW: 14.6 % (ref 11.6–15.4)
WBC: 12.7 10*3/uL — ABNORMAL HIGH (ref 3.4–10.8)

## 2019-02-01 LAB — COMPREHENSIVE METABOLIC PANEL
ALT: 20 IU/L (ref 0–44)
AST: 20 IU/L (ref 0–40)
Albumin/Globulin Ratio: 1 — ABNORMAL LOW (ref 1.2–2.2)
Albumin: 4 g/dL (ref 3.8–4.9)
Alkaline Phosphatase: 115 IU/L (ref 39–117)
BUN/Creatinine Ratio: 13 (ref 9–20)
BUN: 22 mg/dL (ref 6–24)
Bilirubin Total: 0.4 mg/dL (ref 0.0–1.2)
CO2: 23 mmol/L (ref 20–29)
Calcium: 9 mg/dL (ref 8.7–10.2)
Chloride: 102 mmol/L (ref 96–106)
Creatinine, Ser: 1.72 mg/dL — ABNORMAL HIGH (ref 0.76–1.27)
GFR calc Af Amer: 50 mL/min/{1.73_m2} — ABNORMAL LOW (ref >59)
GFR calc non Af Amer: 43 mL/min/{1.73_m2} — ABNORMAL LOW (ref >59)
Globulin, Total: 3.9 g/dL (ref 1.5–4.5)
Glucose: 129 mg/dL — ABNORMAL HIGH (ref 65–99)
Potassium: 4.4 mmol/L (ref 3.5–5.2)
Sodium: 139 mmol/L (ref 134–144)
Total Protein: 7.9 g/dL (ref 6.0–8.5)

## 2019-02-01 LAB — LIPID PANEL
Chol/HDL Ratio: 4.1 ratio (ref 0.0–5.0)
Cholesterol, Total: 185 mg/dL (ref 100–199)
HDL: 45 mg/dL (ref >39)
LDL Chol Calc (NIH): 108 mg/dL — ABNORMAL HIGH (ref 0–99)
Triglycerides: 186 mg/dL — ABNORMAL HIGH (ref 0–149)
VLDL Cholesterol Cal: 32 mg/dL (ref 5–40)

## 2019-02-01 LAB — HEMOGLOBIN A1C
Est. average glucose Bld gHb Est-mCnc: 169 mg/dL
Hgb A1c MFr Bld: 7.5 % — ABNORMAL HIGH (ref 4.8–5.6)

## 2019-02-01 LAB — PSA: Prostate Specific Ag, Serum: 2.1 ng/mL (ref 0.0–4.0)

## 2019-02-08 ENCOUNTER — Encounter: Payer: Self-pay | Admitting: *Deleted

## 2019-02-17 ENCOUNTER — Other Ambulatory Visit: Payer: Self-pay

## 2019-02-17 ENCOUNTER — Telehealth: Payer: Self-pay

## 2019-02-17 DIAGNOSIS — Z86010 Personal history of colonic polyps: Secondary | ICD-10-CM

## 2019-02-17 NOTE — Telephone Encounter (Signed)
Gastroenterology Pre-Procedure Review  Request Date: Friday 03/11/19 Requesting Physician: Dr. Allen Norris  PATIENT REVIEW QUESTIONS: The patient responded to the following health history questions as indicated:    1. Are you having any GI issues? no 2. Do you have a personal history of Polyps? yes (2017 with Dr. Allen Norris) 3. Do you have a family history of Colon Cancer or Polyps? yes (brother had colon polyps) 4. Diabetes Mellitus? yes (takes oral meds) 5. Joint replacements in the past 12 months?no 6. Major health problems in the past 3 months?no 7. Any artificial heart valves, MVP, or defibrillator?no  8. Cardiac Health-History of AFIB Cardiac Clearance Sent to Dr. Marianna Payment office    MEDICATIONS & ALLERGIES:    Patient reports the following regarding taking any anticoagulation/antiplatelet therapy:   Plavix, Coumadin, Eliquis, Xarelto, Lovenox, Pradaxa, Brilinta, or Effient? yes (xarelto blood thinner sent to dr. Clayborn Bigness) Aspirin? no  Patient confirms/reports the following medications:  Current Outpatient Medications  Medication Sig Dispense Refill  . allopurinol (ZYLOPRIM) 100 MG tablet TAKE 1 TABLET BY MOUTH EVERY DAY (Patient taking differently: Take 100 mg by mouth daily. ) 90 tablet 4  . amiodarone (PACERONE) 200 MG tablet Take 1 tablet (200 mg total) by mouth daily. 90 tablet 0  . amLODipine (NORVASC) 5 MG tablet TAKE 1 TABLET BY MOUTH EVERY DAY (Patient taking differently: Take 5 mg by mouth daily. ) 90 tablet 4  . diltiazem (CARDIZEM) 30 MG tablet Take 1 tablet (30 mg total) by mouth 2 (two) times daily. 60 tablet 6  . glucose blood (FREESTYLE LITE) test strip Use as instructed 100 each 12  . JANUMET XR [phone] MG TB24 TAKE 1 TABLET BY MOUTH EVERY DAY 30 tablet 5  . Lancets (FREESTYLE) lancets Use as instructed 100 each 12  . losartan-hydrochlorothiazide (HYZAAR) 100-25 MG tablet Take 1 tablet by mouth daily.    . metoprolol succinate (TOPROL-XL) 100 MG 24 hr tablet Take 1 tablet  (100 mg total) by mouth daily for 30 days. Take with or immediately following a meal. 30 tablet 6  . Multiple Vitamin (MULTIVITAMIN WITH MINERALS) TABS tablet Take 1 tablet by mouth daily. Complete Multivitamin    . pravastatin (PRAVACHOL) 40 MG tablet TAKE 1 TABLET BY MOUTH EVERYDAY AT BEDTIME (Patient taking differently: Take 40 mg by mouth daily. ) 90 tablet 4  . rivaroxaban (XARELTO) 20 MG TABS tablet Take 1 tablet (20 mg total) by mouth daily. 90 tablet 3  . sildenafil (VIAGRA) 100 MG tablet Take 0.5-1 tablets (50-100 mg total) by mouth daily as needed for erectile dysfunction. 10 tablet 0   No current facility-administered medications for this visit.     Patient confirms/reports the following allergies:  Allergies  Allergen Reactions  . Eggs Or Egg-Derived Products Shortness Of Breath    Throat swells. Patient able to eat products that contain egg just not whole egg alone.  . Fish Allergy Shortness Of Breath    Throat swells    No orders of the defined types were placed in this encounter.   AUTHORIZATION INFORMATION Primary Insurance: 1D#: Group #:  Secondary Insurance: 1D#: Group #:  SCHEDULE INFORMATION: Date: 03/11/19 Time: Location: [facility]

## 2019-02-21 ENCOUNTER — Telehealth: Payer: Self-pay

## 2019-02-21 NOTE — Telephone Encounter (Signed)
Patient has been advised per Dr. Marianna Payment blood thinner request faxed, to stop xarelto 4 days prior to colonoscopy and restart 2 days after.  Patient expressed verbal understanding.  Thanks [employer]

## 2019-03-08 ENCOUNTER — Other Ambulatory Visit: Admission: RE | Admit: 2019-03-08 | Payer: Commercial Managed Care - PPO | Source: Ambulatory Visit

## 2019-03-10 ENCOUNTER — Telehealth: Payer: Self-pay

## 2019-03-10 NOTE — Telephone Encounter (Signed)
Patient contacted in regards to not having his COVID test done as scheduled.  He Electrical engineer.  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 and is not in network with [facility].  He said he will need to have his colonoscopy somewhere else.  Thanks [employer]

## 2019-03-11 ENCOUNTER — Encounter: Admission: RE | Payer: Self-pay | Source: Home / Self Care

## 2019-03-11 ENCOUNTER — Ambulatory Visit
Admission: RE | Admit: 2019-03-11 | Payer: Commercial Managed Care - PPO | Source: Home / Self Care | Admitting: Gastroenterology

## 2019-03-11 SURGERY — COLONOSCOPY WITH PROPOFOL
Anesthesia: General

## 2019-06-01 ENCOUNTER — Other Ambulatory Visit: Payer: Self-pay | Admitting: Internal Medicine

## 2019-06-01 ENCOUNTER — Telehealth: Payer: Self-pay | Admitting: Cardiovascular Disease

## 2019-06-01 NOTE — Telephone Encounter (Signed)
Received forms from Regino Ramirez to be completed Placed in interoffice mail and sent to [facility]

## 2019-06-02 ENCOUNTER — Other Ambulatory Visit: Payer: Self-pay | Admitting: Internal Medicine

## 2019-06-02 DIAGNOSIS — M1000 Idiopathic gout, unspecified site: Secondary | ICD-10-CM

## 2019-06-03 ENCOUNTER — Ambulatory Visit: Payer: Commercial Managed Care - PPO | Admitting: Internal Medicine

## 2019-06-17 ENCOUNTER — Ambulatory Visit (INDEPENDENT_AMBULATORY_CARE_PROVIDER_SITE_OTHER): Payer: PRIVATE HEALTH INSURANCE | Admitting: Internal Medicine

## 2019-06-17 ENCOUNTER — Encounter: Payer: Self-pay | Admitting: Internal Medicine

## 2019-06-17 ENCOUNTER — Other Ambulatory Visit: Payer: Self-pay

## 2019-06-17 VITALS — BP 132/76 | HR 96 | Temp 98.1°F | Resp 20 | Ht 71.0 in | Wt 326.2 lb

## 2019-06-17 DIAGNOSIS — D126 Benign neoplasm of colon, unspecified: Secondary | ICD-10-CM

## 2019-06-17 DIAGNOSIS — E118 Type 2 diabetes mellitus with unspecified complications: Secondary | ICD-10-CM

## 2019-06-17 DIAGNOSIS — E785 Hyperlipidemia, unspecified: Secondary | ICD-10-CM

## 2019-06-17 DIAGNOSIS — I480 Paroxysmal atrial fibrillation: Secondary | ICD-10-CM

## 2019-06-17 DIAGNOSIS — I10 Essential (primary) hypertension: Secondary | ICD-10-CM | POA: Diagnosis not present

## 2019-06-17 LAB — POCT URINALYSIS DIPSTICK
Bilirubin, UA: NEGATIVE
Glucose, UA: NEGATIVE
Ketones, UA: NEGATIVE
Leukocytes, UA: NEGATIVE
Nitrite, UA: NEGATIVE
Protein, UA: POSITIVE — AB
Spec Grav, UA: 1.02 (ref 1.010–1.025)
Urobilinogen, UA: 0.2 E.U./dL
pH, UA: 7 (ref 5.0–8.0)

## 2019-06-17 MED ORDER — PRAVASTATIN SODIUM 40 MG PO TABS: 40.0000 mg | ORAL_TABLET | Freq: Every day | ORAL | 3 refills | Status: DC

## 2019-06-17 MED ORDER — AMLODIPINE BESYLATE 5 MG PO TABS: 5.0000 mg | ORAL_TABLET | Freq: Every day | ORAL | 4 refills | Status: DC

## 2019-06-17 NOTE — Progress Notes (Signed)
Date:  06/17/2019   Name:  Jon Mendez   DOB:  03/23/1960   MRN:  [id number]   Chief Complaint: Follow-up (DM follow up. sugars have been reading good at home.  )  Diabetes He presents for his follow-up diabetic visit. He has type 2 diabetes mellitus. His disease course has been stable. Pertinent negatives for hypoglycemia include no dizziness, headaches or tremors. Pertinent negatives for diabetes include no chest pain, no fatigue, no polydipsia and no polyuria. Current diabetic treatment includes oral agent (dual therapy). His weight is increasing steadily. He is following a generally healthy diet. He monitors blood glucose at home 1-2 x per week. His breakfast blood glucose is taken between 8-9 am. His breakfast blood glucose range is generally 110-130 mg/dl. An ACE inhibitor/angiotensin II receptor blocker is being taken.  Hypertension This is a chronic problem. The problem is controlled. Pertinent negatives include no chest pain, headaches, palpitations or shortness of breath. Past treatments include diuretics, angiotensin blockers, beta blockers and calcium channel blockers. There are no compliance problems.  Hypertensive end-organ damage includes CAD/MI (atrial fibrillation).    Lab Results  Component Value Date   CREATININE 1.72 (H) 01/31/2019   BUN 22 01/31/2019   NA 139 01/31/2019   K 4.4 01/31/2019   CL 102 01/31/2019   CO2 23 01/31/2019   Lab Results  Component Value Date   CHOL 185 01/31/2019   HDL 45 01/31/2019   LDLCALC 108 (H) 01/31/2019   TRIG 186 (H) 01/31/2019   CHOLHDL 4.1 01/31/2019   Lab Results  Component Value Date   TSH 3.208 05/03/2018   Lab Results  Component Value Date   HGBA1C 7.5 (H) 01/31/2019     Review of Systems  Constitutional: Negative for appetite change, chills, fatigue and unexpected weight change.  Eyes: Negative for visual disturbance.  Respiratory: Negative for cough, shortness of breath and wheezing.   Cardiovascular:  Negative for chest pain, palpitations and leg swelling.  Gastrointestinal: Negative for abdominal pain and blood in stool.  Endocrine: Negative for polydipsia and polyuria.  Genitourinary: Negative for dysuria and hematuria.  Skin: Negative for color change and rash.  Neurological: Negative for dizziness, tremors, numbness and headaches.  Psychiatric/Behavioral: Negative for dysphoric mood.    Patient Active Problem List   Diagnosis Date Noted  . New onset atrial fibrillation ([place]) 05/03/2018  . Bronchitis 05/03/2018  . Type II diabetes mellitus with complication ([place]) 24/40/1027  . Tubular adenoma of colon   . CKD stage G3a/A2, GFR 45-59 and albumin creatinine ratio 30-299 mg/g 04/25/2015  . Chronic gouty arthritis 11/22/2014  . Hyperlipidemia associated with type 2 diabetes mellitus ([place]) 11/22/2014  . Essential (primary) hypertension 11/22/2014  . Compulsive tobacco user syndrome 11/22/2014  . Varicose veins of both lower extremities 11/22/2014  . Hidradenitis suppurativa 09/06/2012  . Benign paroxysmal positional nystagmus 07/29/2004    Allergies  Allergen Reactions  . Eggs Or Egg-Derived Products Shortness Of Breath    Throat swells. Patient able to eat products that contain egg just not whole egg alone.  . Fish Allergy Shortness Of Breath    Throat swells    Past Surgical History:  Procedure Laterality Date  . CARDIOVERSION N/A 06/17/2018   Procedure: CARDIOVERSION (CATH LAB);  Surgeon: Minna Merritts, MD;  Location: [facility] ORS;  Service: Cardiovascular;  Laterality: N/A;  . COLONOSCOPY WITH PROPOFOL N/A 06/04/2015   Procedure: COLONOSCOPY WITH PROPOFOL;  Surgeon: Lucilla Lame, MD;  5 tubular adenomas  . HIP  ARTHROPLASTY Left 2013  . LEG SKIN LESION  BIOPSY / EXCISION Right 2015   Hidradenitis  . POLYPECTOMY  06/04/2015   Procedure: POLYPECTOMY;  Surgeon: Lucilla Lame, MD;  Location: [facility];  Service: Endoscopy;;  . VARICOSE VEIN SURGERY Bilateral 2015     Social History   Tobacco Use  . Smoking status: Current Every Day Smoker    Packs/day: 0.50    Years: 30.00    Pack years: 15.00    Types: Cigarettes  . Smokeless tobacco: Never Used  Substance Use Topics  . Alcohol use: No    Alcohol/week: 4.0 standard drinks    Types: 4 Standard drinks or equivalent per week  . Drug use: No     Medication list has been reviewed and updated.  Current Meds  Medication Sig  . allopurinol (ZYLOPRIM) 100 MG tablet TAKE 1 TABLET BY MOUTH EVERY DAY  . amiodarone (PACERONE) 200 MG tablet Take 1 tablet (200 mg total) by mouth daily.  Marland Kitchen amLODipine (NORVASC) 5 MG tablet TAKE 1 TABLET BY MOUTH EVERY DAY (Patient taking differently: Take 5 mg by mouth daily. )  . diltiazem (CARDIZEM) 30 MG tablet Take 1 tablet (30 mg total) by mouth 2 (two) times daily.  Marland Kitchen glucose blood (FREESTYLE LITE) test strip Use as instructed  . JANUMET XR [id number] MG TB24 TAKE 1 TABLET BY MOUTH EVERY DAY  . Lancets (FREESTYLE) lancets Use as instructed  . losartan-hydrochlorothiazide (HYZAAR) 100-25 MG tablet Take 1 tablet by mouth daily.  . metoprolol succinate (TOPROL-XL) 100 MG 24 hr tablet Take 1 tablet (100 mg total) by mouth daily for 30 days. Take with or immediately following a meal.  . Multiple Vitamin (MULTIVITAMIN WITH MINERALS) TABS tablet Take 1 tablet by mouth daily. Complete Multivitamin  . pravastatin (PRAVACHOL) 40 MG tablet TAKE 1 TABLET BY MOUTH EVERYDAY AT BEDTIME (Patient taking differently: Take 40 mg by mouth daily. )  . rivaroxaban (XARELTO) 20 MG TABS tablet Take 1 tablet (20 mg total) by mouth daily.  . sildenafil (VIAGRA) 100 MG tablet Take 0.5-1 tablets (50-100 mg total) by mouth daily as needed for erectile dysfunction.    PHQ 2/9 Scores 06/24/2018 05/24/2018 12/24/2017 04/28/2016  PHQ - 2 Score 0 0 0 0    BP Readings from Last 3 Encounters:  06/17/19 132/76  01/31/19 132/70  06/24/18 124/78    Physical Exam Vitals and nursing note reviewed.   Constitutional:      General: He is not in acute distress.    Appearance: He is well-developed. He is obese.  HENT:     Head: Normocephalic and atraumatic.  Cardiovascular:     Rate and Rhythm: Normal rate and regular rhythm.     Pulses: Normal pulses.  Pulmonary:     Effort: Pulmonary effort is normal. No respiratory distress.  Musculoskeletal:     Cervical back: Normal range of motion.     Right lower leg: No edema.     Left lower leg: No edema.  Lymphadenopathy:     Cervical: No cervical adenopathy.  Skin:    General: Skin is warm and dry.     Capillary Refill: Capillary refill takes less than 2 seconds.     Findings: No rash.  Neurological:     General: No focal deficit present.     Mental Status: He is alert and oriented to person, place, and time.  Psychiatric:        Behavior: Behavior normal.  Thought Content: Thought content normal.     Wt Readings from Last 3 Encounters:  06/17/19 (!) 326 lb 4 oz (148 kg)  01/31/19 (!) 335 lb (152 kg)  06/24/18 285 lb (129.3 kg)    BP 132/76   Pulse 96   Temp 98.1 F (36.7 C) (Oral)   Resp 20   Ht 5\' 11"  (1.803 m)   Wt (!) 326 lb 4 oz (148 kg)   SpO2 98%   BMI 45.50 kg/m   Assessment and Plan: 1. Type II diabetes mellitus with complication (HCC) Clinically stable by exam and report without s/s of hypoglycemia. DM complicated by HTN. Tolerating medications well without side effects or other concerns. - Hemoglobin V7B - Basic metabolic panel - POCT urinalysis dipstick  2. Essential (primary) hypertension Clinically stable exam with well controlled BP on multiple agents. Tolerating medications without side effects at this time. Pt to continue current regimen and low sodium diet; benefits of regular exercise as able discussed. - amLODipine (NORVASC) 5 MG tablet; Take 1 tablet (5 mg total) by mouth daily.  Dispense: 90 tablet; Refill: 4  3. Paroxysmal atrial fibrillation (HCC) In SR today Followed by  Cardiology Continues on Pacerone and Xarelto  4. Dyslipidemia Fair control of lipids on pravachol 40 mg.  No side effects noted. Consider a change if not improving - pravastatin (PRAVACHOL) 40 MG tablet; Take 1 tablet (40 mg total) by mouth daily.  Dispense: 90 tablet; Refill: 3  5. Tubular adenoma of colon He is overdue for follow up of tubular adenomas Will send message to Dr. Dorothey Baseman office    Partially dictated using Editor, commissioning. Any errors are unintentional.  Halina Maidens, MD [facility]  06/17/2019

## 2019-06-18 LAB — BASIC METABOLIC PANEL
BUN/Creatinine Ratio: 11 (ref 9–20)
BUN: 19 mg/dL (ref 6–24)
CO2: 19 mmol/L — ABNORMAL LOW (ref 20–29)
Calcium: 9.6 mg/dL (ref 8.7–10.2)
Chloride: 99 mmol/L (ref 96–106)
Creatinine, Ser: 1.8 mg/dL — ABNORMAL HIGH (ref 0.76–1.27)
GFR calc Af Amer: 47 mL/min/{1.73_m2} — ABNORMAL LOW (ref >59)
GFR calc non Af Amer: 40 mL/min/{1.73_m2} — ABNORMAL LOW (ref >59)
Glucose: 129 mg/dL — ABNORMAL HIGH (ref 65–99)
Potassium: 4.5 mmol/L (ref 3.5–5.2)
Sodium: 136 mmol/L (ref 134–144)

## 2019-06-18 LAB — HEMOGLOBIN A1C
Est. average glucose Bld gHb Est-mCnc: 183 mg/dL
Hgb A1c MFr Bld: 8 % — ABNORMAL HIGH (ref 4.8–5.6)

## 2019-06-28 ENCOUNTER — Other Ambulatory Visit: Payer: Self-pay

## 2019-06-28 DIAGNOSIS — L732 Hidradenitis suppurativa: Secondary | ICD-10-CM

## 2019-07-01 ENCOUNTER — Other Ambulatory Visit: Payer: Self-pay

## 2019-07-01 DIAGNOSIS — L732 Hidradenitis suppurativa: Secondary | ICD-10-CM

## 2019-08-03 ENCOUNTER — Telehealth: Payer: Self-pay | Admitting: Internal Medicine

## 2019-08-03 NOTE — Telephone Encounter (Unsigned)
Copied from [place] [id number]. Topic: General - Other >> Aug 03, 2019 11:21 AM Antonieta Iba C wrote: Reason for CRM: pts spouse called in to request a call back from providers assistance in regards to pt.

## 2019-08-03 NOTE — Telephone Encounter (Signed)
Called and spoke with patients wife Trustin Chapa. Pt said Mr. Laabs has a form that needs to be completed about his health history - told her she can drop this by the office so we can take a look at this and determine if we can complete this or not.  She said she was going to get [facility] Dr Jefm Bryant to fill this out but he is now retired since doing patients hip replacement. Told her I'll call her after Dr Army Melia reviews the form and let her know what we can do.  CM

## 2019-10-20 ENCOUNTER — Ambulatory Visit: Payer: PRIVATE HEALTH INSURANCE | Admitting: Internal Medicine

## 2019-10-28 ENCOUNTER — Other Ambulatory Visit: Payer: Self-pay

## 2019-10-28 ENCOUNTER — Ambulatory Visit (INDEPENDENT_AMBULATORY_CARE_PROVIDER_SITE_OTHER): Payer: PRIVATE HEALTH INSURANCE | Admitting: Internal Medicine

## 2019-10-28 ENCOUNTER — Encounter: Payer: Self-pay | Admitting: Internal Medicine

## 2019-10-28 VITALS — BP 112/78 | HR 83 | Temp 99.1°F | Ht 71.0 in | Wt 314.0 lb

## 2019-10-28 DIAGNOSIS — N1831 Chronic kidney disease, stage 3a: Secondary | ICD-10-CM | POA: Diagnosis not present

## 2019-10-28 DIAGNOSIS — E118 Type 2 diabetes mellitus with unspecified complications: Secondary | ICD-10-CM | POA: Diagnosis not present

## 2019-10-28 DIAGNOSIS — I10 Essential (primary) hypertension: Secondary | ICD-10-CM

## 2019-10-28 MED ORDER — JANUMET XR 100-1000 MG PO TB24: 1.0000 | ORAL_TABLET | Freq: Every day | ORAL | 5 refills | Status: DC

## 2019-10-28 NOTE — Progress Notes (Signed)
Date:  10/28/2019   Name:  Jon Mendez   DOB:  03/23/1960   MRN:  [id number]   Chief Complaint: Hypertension (follow up ) and Diabetes (last reading -131 X3 days ago )  Diabetes He presents for his follow-up diabetic visit. He has type 2 diabetes mellitus. His disease course has been stable. Pertinent negatives for hypoglycemia include no headaches or tremors. Pertinent negatives for diabetes include no chest pain, no fatigue, no polydipsia and no polyuria. Symptoms are stable. Current diabetic treatments: janumet. He is compliant with treatment all of the time. His weight is stable. He monitors blood glucose at home 1-2 x per week. His breakfast blood glucose is taken between 7-8 am. His breakfast blood glucose range is generally 110-130 mg/dl. An ACE inhibitor/angiotensin II receptor blocker is being taken (should be on ARB or ACE).  Hypertension This is a chronic problem. The problem is controlled. Pertinent negatives include no chest pain, headaches, palpitations or shortness of breath. Past treatments include calcium channel blockers, diuretics, beta blockers and angiotensin blockers. The current treatment provides significant improvement.    Lab Results  Component Value Date   CREATININE 1.80 (H) 06/17/2019   BUN 19 06/17/2019   NA 136 06/17/2019   K 4.5 06/17/2019   CL 99 06/17/2019   CO2 19 (L) 06/17/2019   Lab Results  Component Value Date   CHOL 185 01/31/2019   HDL 45 01/31/2019   LDLCALC 108 (H) 01/31/2019   TRIG 186 (H) 01/31/2019   CHOLHDL 4.1 01/31/2019   Lab Results  Component Value Date   TSH 3.208 05/03/2018   Lab Results  Component Value Date   HGBA1C 8.0 (H) 06/17/2019   Lab Results  Component Value Date   WBC 12.7 (H) 01/31/2019   HGB 13.5 01/31/2019   HCT 40.9 01/31/2019   MCV 79 01/31/2019   PLT 263 01/31/2019   Lab Results  Component Value Date   ALT 20 01/31/2019   AST 20 01/31/2019   ALKPHOS 115 01/31/2019   BILITOT 0.4  01/31/2019     Review of Systems  Constitutional: Negative for appetite change, fatigue and unexpected weight change (15 lbs loss since last visit).  Eyes: Negative for visual disturbance.  Respiratory: Negative for cough, shortness of breath and wheezing.   Cardiovascular: Negative for chest pain, palpitations and leg swelling.  Gastrointestinal: Negative for abdominal pain and blood in stool.  Endocrine: Negative for polydipsia and polyuria.  Genitourinary: Negative for dysuria and hematuria.  Skin: Positive for rash (still waiting on Humira to be approved). Negative for color change.  Neurological: Negative for tremors, numbness and headaches.  Psychiatric/Behavioral: Negative for dysphoric mood.    Patient Active Problem List   Diagnosis Date Noted  . Paroxysmal atrial fibrillation ([place]) 06/17/2019  . Bronchitis 05/03/2018  . Type II diabetes mellitus with complication ([place]) 37/62/8315  . Tubular adenoma of colon   . CKD stage G3a/A2, GFR 45-59 and albumin creatinine ratio 30-299 mg/g 04/25/2015  . Chronic gouty arthritis 11/22/2014  . Hyperlipidemia associated with type 2 diabetes mellitus ([place]) 11/22/2014  . Essential (primary) hypertension 11/22/2014  . Compulsive tobacco user syndrome 11/22/2014  . Varicose veins of both lower extremities 11/22/2014  . Hidradenitis suppurativa 09/06/2012  . Benign paroxysmal positional nystagmus 07/29/2004    Allergies  Allergen Reactions  . Eggs Or Egg-Derived Products Shortness Of Breath    Throat swells. Patient able to eat products that contain egg just not whole egg alone.  Marland Kitchen  Fish Allergy Shortness Of Breath    Throat swells    Past Surgical History:  Procedure Laterality Date  . CARDIOVERSION N/A 06/17/2018   Procedure: CARDIOVERSION (CATH LAB);  Surgeon: Minna Merritts, MD;  Location: [facility]S;  Service: Cardiovascular;  Laterality: N/A;  . COLONOSCOPY WITH PROPOFOL N/A 06/04/2015   Procedure: COLONOSCOPY WITH PROPOFOL;   Surgeon: Lucilla Lame, MD;  5 tubular adenomas  . HIP ARTHROPLASTY Left 2013  . LEG SKIN LESION  BIOPSY / EXCISION Right 2015   Hidradenitis  . POLYPECTOMY  06/04/2015   Procedure: POLYPECTOMY;  Surgeon: Lucilla Lame, MD;  Location: [place];  Service: Endoscopy;;  . VARICOSE VEIN SURGERY Bilateral 2015    Social History   Tobacco Use  . Smoking status: Current Every Day Smoker    Packs/day: 0.25    Years: 30.00    Pack years: 7.50    Types: Cigarettes  . Smokeless tobacco: Never Used  Vaping Use  . Vaping Use: Never assessed  Substance Use Topics  . Alcohol use: No    Alcohol/week: 4.0 standard drinks    Types: 4 Standard drinks or equivalent per week  . Drug use: No     Medication list has been reviewed and updated.  Current Meds  Medication Sig  . acyclovir (ZOVIRAX) 400 MG tablet Take 400 mg by mouth 2 (two) times daily.  Marland Kitchen allopurinol (ZYLOPRIM) 100 MG tablet TAKE 1 TABLET BY MOUTH EVERY DAY  . amiodarone (PACERONE) 200 MG tablet Take 1 tablet (200 mg total) by mouth daily.  Marland Kitchen amLODipine (NORVASC) 5 MG tablet Take 1 tablet (5 mg total) by mouth daily.  Marland Kitchen diltiazem (CARDIZEM) 30 MG tablet Take 1 tablet (30 mg total) by mouth 2 (two) times daily.  Marland Kitchen glucose blood (FREESTYLE LITE) test strip Use as instructed  . JANUMET XR [phone] MG TB24 TAKE 1 TABLET BY MOUTH EVERY DAY  . Lancets (FREESTYLE) lancets Use as instructed  . losartan-hydrochlorothiazide (HYZAAR) 100-25 MG tablet Take 1 tablet by mouth daily.  . metoprolol succinate (TOPROL-XL) 100 MG 24 hr tablet Take 1 tablet (100 mg total) by mouth daily for 30 days. Take with or immediately following a meal.  . Multiple Vitamin (MULTIVITAMIN WITH MINERALS) TABS tablet Take 1 tablet by mouth daily. Complete Multivitamin  . pravastatin (PRAVACHOL) 40 MG tablet Take 1 tablet (40 mg total) by mouth daily.  . rivaroxaban (XARELTO) 20 MG TABS tablet Take 1 tablet (20 mg total) by mouth daily.  . sildenafil (VIAGRA)  100 MG tablet Take 0.5-1 tablets (50-100 mg total) by mouth daily as needed for erectile dysfunction.  . triamcinolone ointment (KENALOG) 0.1 % Apply topically 2 (two) times daily.    PHQ 2/9 Scores 10/28/2019 06/24/2018 05/24/2018 12/24/2017  PHQ - 2 Score 0 0 0 0  PHQ- 9 Score 2 - - -    GAD 7 : Generalized Anxiety Score 10/28/2019  Nervous, Anxious, on Edge 0  Worry too much - different things 1  Trouble relaxing 0  Restless 0  Easily annoyed or irritable 0  Afraid - awful might happen 0  Anxiety Difficulty Not difficult at all    BP Readings from Last 3 Encounters:  10/28/19 112/78  06/17/19 132/76  01/31/19 132/70    Physical Exam Vitals and nursing note reviewed.  Constitutional:      General: He is not in acute distress.    Appearance: He is well-developed.  HENT:     Head: Normocephalic and atraumatic.  Cardiovascular:     Rate and Rhythm: Normal rate and regular rhythm.     Pulses: Normal pulses.     Heart sounds: No murmur heard.   Pulmonary:     Effort: Pulmonary effort is normal. No respiratory distress.     Breath sounds: No wheezing or rhonchi.  Musculoskeletal:        General: Normal range of motion.     Cervical back: Normal range of motion.     Right lower leg: No edema.     Left lower leg: No edema.  Lymphadenopathy:     Cervical: No cervical adenopathy.  Neurological:     Mental Status: He is alert and oriented to person, place, and time.  Psychiatric:        Mood and Affect: Mood normal.     Wt Readings from Last 3 Encounters:  10/28/19 (!) 314 lb (142.4 kg)  06/17/19 (!) 326 lb 4 oz (148 kg)  01/31/19 (!) 335 lb (152 kg)    BP 112/78   Pulse 83   Temp 99.1 F (37.3 C) (Oral)   Ht 5\' 11"  (1.803 m)   Wt (!) 314 lb (142.4 kg)   SpO2 97%   BMI 43.79 kg/m   Assessment and Plan: 1. Type II diabetes mellitus with complication (HCC) Clinically stable by exam and report without s/s of hypoglycemia. DM complicated by HTN, renal  disease. Tolerating medications well without side effects or other concerns. Refer to podiatry for foot and nail care - Hemoglobin A1c - Comprehensive metabolic panel  2. Essential (primary) hypertension Clinically stable exam with well controlled BP on losartan hct, cardiazem, amlodipine. Tolerating medications without side effects at this time. Pt to continue current regimen and low sodium diet; benefits of regular exercise as able discussed.  3. CKD stage G3a/A2, GFR 45-59 and albumin creatinine ratio 30-299 mg/g - Comprehensive metabolic panel   Partially dictated using Editor, commissioning. Any errors are unintentional.  Halina Maidens, MD [facility]  10/28/2019

## 2019-10-29 LAB — COMPREHENSIVE METABOLIC PANEL
ALT: 15 IU/L (ref 0–44)
AST: 15 IU/L (ref 0–40)
Albumin/Globulin Ratio: 1.3 (ref 1.2–2.2)
Albumin: 4.4 g/dL (ref 3.8–4.9)
Alkaline Phosphatase: 135 IU/L — ABNORMAL HIGH (ref 48–121)
BUN/Creatinine Ratio: 10 (ref 9–20)
BUN: 16 mg/dL (ref 6–24)
Bilirubin Total: 0.3 mg/dL (ref 0.0–1.2)
CO2: 23 mmol/L (ref 20–29)
Calcium: 10.2 mg/dL (ref 8.7–10.2)
Chloride: 96 mmol/L (ref 96–106)
Creatinine, Ser: 1.63 mg/dL — ABNORMAL HIGH (ref 0.76–1.27)
GFR calc Af Amer: 53 mL/min/{1.73_m2} — ABNORMAL LOW (ref >59)
GFR calc non Af Amer: 45 mL/min/{1.73_m2} — ABNORMAL LOW (ref >59)
Globulin, Total: 3.4 g/dL (ref 1.5–4.5)
Glucose: 155 mg/dL — ABNORMAL HIGH (ref 65–99)
Potassium: 5 mmol/L (ref 3.5–5.2)
Sodium: 137 mmol/L (ref 134–144)
Total Protein: 7.8 g/dL (ref 6.0–8.5)

## 2019-10-29 LAB — HEMOGLOBIN A1C
Est. average glucose Bld gHb Est-mCnc: 209 mg/dL
Hgb A1c MFr Bld: 8.9 % — ABNORMAL HIGH (ref 4.8–5.6)

## 2019-11-02 ENCOUNTER — Other Ambulatory Visit: Payer: Self-pay

## 2019-11-02 DIAGNOSIS — E118 Type 2 diabetes mellitus with unspecified complications: Secondary | ICD-10-CM

## 2019-11-02 MED ORDER — DAPAGLIFLOZIN PROPANEDIOL 10 MG PO TABS: 10.0000 mg | ORAL_TABLET | Freq: Every day | ORAL | 5 refills | Status: DC

## 2019-11-14 ENCOUNTER — Other Ambulatory Visit: Payer: Self-pay | Admitting: Internal Medicine

## 2019-11-14 ENCOUNTER — Telehealth: Payer: Self-pay

## 2019-11-14 DIAGNOSIS — E118 Type 2 diabetes mellitus with unspecified complications: Secondary | ICD-10-CM

## 2019-11-14 MED ORDER — STEGLATRO 15 MG PO TABS: 15.0000 mg | ORAL_TABLET | Freq: Every day | ORAL | 1 refills | Status: DC

## 2019-11-14 NOTE — Telephone Encounter (Signed)
Called pt could not leave VM. Mailbox was not set up. Called pt to let him know that the PA for Wilder Glade got denied. Dr. Army Melia sent in a alterative medication Steglatro to pharmacy. The medication is free with a coupon. Pt can come pick the coupon up at the office to get the medication for free.   Will route result note to [facility] Nurse Triage for follow up when patient returns call to clinic.     KP

## 2019-11-17 ENCOUNTER — Telehealth: Payer: Self-pay

## 2019-11-17 NOTE — Telephone Encounter (Signed)
Called pt to let him know that Wilder Glade was denied by [employer]. Dr. Army Melia sent in prescription for [facility]. We have a coupon in office for pt to come pick up so the medication will be free. Pt verbalized understanding and said he would come pick the coupon up tomorrow 11/18/2019.  KP

## 2019-11-18 ENCOUNTER — Other Ambulatory Visit: Payer: Self-pay | Admitting: Internal Medicine

## 2019-11-18 DIAGNOSIS — L732 Hidradenitis suppurativa: Secondary | ICD-10-CM

## 2019-11-18 MED ORDER — CEFDINIR 300 MG PO CAPS: 300.0000 mg | ORAL_CAPSULE | Freq: Two times a day (BID) | ORAL | 0 refills | Status: AC

## 2019-12-29 ENCOUNTER — Ambulatory Visit: Payer: PRIVATE HEALTH INSURANCE | Admitting: Podiatry

## 2020-01-03 ENCOUNTER — Ambulatory Visit: Payer: PRIVATE HEALTH INSURANCE | Admitting: Podiatry

## 2020-02-02 ENCOUNTER — Encounter: Payer: PRIVATE HEALTH INSURANCE | Admitting: Internal Medicine

## 2020-02-02 NOTE — Progress Notes (Deleted)
Date:  02/02/2020   Name:  Jon Mendez   DOB:  03/23/1960   MRN:  [id number]   Chief Complaint: No chief complaint on file.  Jon Mendez is a 59 y.o. male who presents today for his Complete Annual Exam. He feels {DESC; WELL/FAIRLY WELL/POORLY:18703}. He reports exercising ***. He reports he is sleeping {DESC; WELL/FAIRLY WELL/POORLY:18703}.   Colonoscopy: 05/2015  Immunization History  Administered Date(s) Administered  . PFIZER SARS-COV-2 Vaccination 06/23/2019, 07/14/2019  . Pneumococcal Polysaccharide-23 04/28/2016  . Tdap 04/09/2011    Hypertension This is a chronic problem. The problem is controlled. Pertinent negatives include no chest pain, headaches, palpitations or shortness of breath. Past treatments include angiotensin blockers, diuretics, beta blockers and calcium channel blockers. The current treatment provides significant improvement.  Diabetes He presents for his follow-up diabetic visit. He has type 2 diabetes mellitus. Pertinent negatives for hypoglycemia include no dizziness or headaches. Pertinent negatives for diabetes include no chest pain and no fatigue. Current diabetic treatment includes oral agent (triple therapy) (janumet and jardiance added last visit). He is compliant with treatment all of the time. An ACE inhibitor/angiotensin II receptor blocker is being taken.  Hyperlipidemia This is a chronic problem. The problem is controlled. Pertinent negatives include no chest pain, myalgias or shortness of breath. Current antihyperlipidemic treatment includes statins. The current treatment provides significant improvement of lipids.    Lab Results  Component Value Date   CREATININE 1.63 (H) 10/28/2019   BUN 16 10/28/2019   NA 137 10/28/2019   K 5.0 10/28/2019   CL 96 10/28/2019   CO2 23 10/28/2019   Lab Results  Component Value Date   CHOL 185 01/31/2019   HDL 45 01/31/2019   LDLCALC 108 (H) 01/31/2019   TRIG 186 (H) 01/31/2019   CHOLHDL  4.1 01/31/2019   Lab Results  Component Value Date   TSH 3.208 05/03/2018   Lab Results  Component Value Date   HGBA1C 8.9 (H) 10/28/2019   Lab Results  Component Value Date   WBC 12.7 (H) 01/31/2019   HGB 13.5 01/31/2019   HCT 40.9 01/31/2019   MCV 79 01/31/2019   PLT 263 01/31/2019   Lab Results  Component Value Date   ALT 15 10/28/2019   AST 15 10/28/2019   ALKPHOS 135 (H) 10/28/2019   BILITOT 0.3 10/28/2019     Review of Systems  Constitutional: Negative for appetite change, chills, diaphoresis, fatigue and unexpected weight change.  HENT: Negative for hearing loss, tinnitus, trouble swallowing and voice change.   Eyes: Negative for visual disturbance.  Respiratory: Negative for choking, shortness of breath and wheezing.   Cardiovascular: Negative for chest pain, palpitations and leg swelling.  Gastrointestinal: Negative for abdominal pain, blood in stool, constipation and diarrhea.  Genitourinary: Negative for difficulty urinating, dysuria and frequency.  Musculoskeletal: Negative for arthralgias, back pain and myalgias.  Skin: Negative for color change and rash.  Neurological: Negative for dizziness, syncope and headaches.  Hematological: Negative for adenopathy.  Psychiatric/Behavioral: Negative for dysphoric mood and sleep disturbance.    Patient Active Problem List   Diagnosis Date Noted  . Paroxysmal atrial fibrillation ([place]) 06/17/2019  . Bronchitis 05/03/2018  . Type II diabetes mellitus with complication ([place]) 28/36/6294  . Tubular adenoma of colon   . CKD stage G3a/A2, GFR 45-59 and albumin creatinine ratio 30-299 mg/g (HCC) 04/25/2015  . Chronic gouty arthritis 11/22/2014  . Hyperlipidemia associated with type 2 diabetes mellitus ([place]) 11/22/2014  . Essential (primary) hypertension  11/22/2014  . Compulsive tobacco user syndrome 11/22/2014  . Varicose veins of both lower extremities 11/22/2014  . Hidradenitis suppurativa 09/06/2012  . Benign  paroxysmal positional nystagmus 07/29/2004    Allergies  Allergen Reactions  . Eggs Or Egg-Derived Products Shortness Of Breath    Throat swells. Patient able to eat products that contain egg just not whole egg alone.  . Fish Allergy Shortness Of Breath    Throat swells    Past Surgical History:  Procedure Laterality Date  . CARDIOVERSION N/A 06/17/2018   Procedure: CARDIOVERSION (CATH LAB);  Surgeon: Minna Merritts, MD;  Location: [facility] ORS;  Service: Cardiovascular;  Laterality: N/A;  . COLONOSCOPY WITH PROPOFOL N/A 06/04/2015   Procedure: COLONOSCOPY WITH PROPOFOL;  Surgeon: Lucilla Lame, MD;  5 tubular adenomas  . HIP ARTHROPLASTY Left 2013  . LEG SKIN LESION  BIOPSY / EXCISION Right 2015   Hidradenitis  . POLYPECTOMY  06/04/2015   Procedure: POLYPECTOMY;  Surgeon: Lucilla Lame, MD;  Location: [place];  Service: Endoscopy;;  . VARICOSE VEIN SURGERY Bilateral 2015    Social History   Tobacco Use  . Smoking status: Current Every Day Smoker    Packs/day: 0.25    Years: 30.00    Pack years: 7.50    Types: Cigarettes  . Smokeless tobacco: Never Used  Vaping Use  . Vaping Use: Never assessed  Substance Use Topics  . Alcohol use: No    Alcohol/week: 4.0 standard drinks    Types: 4 Standard drinks or equivalent per week  . Drug use: No     Medication list has been reviewed and updated.  No outpatient medications have been marked as taking for the 02/02/20 encounter (Appointment) with Glean Hess, MD.    [facility] 2/9 Scores 10/28/2019 06/24/2018 05/24/2018 12/24/2017  PHQ - 2 Score 0 0 0 0  PHQ- 9 Score 2 - - -    GAD 7 : Generalized Anxiety Score 10/28/2019  Nervous, Anxious, on Edge 0  Worry too much - different things 1  Trouble relaxing 0  Restless 0  Easily annoyed or irritable 0  Afraid - awful might happen 0  Anxiety Difficulty Not difficult at all    BP Readings from Last 3 Encounters:  10/28/19 112/78  06/17/19 132/76  01/31/19 132/70     Physical Exam Vitals and nursing note reviewed.  Constitutional:      Appearance: Normal appearance. He is well-developed.  HENT:     Head: Normocephalic.     Right Ear: Tympanic membrane, ear canal and external ear normal.     Left Ear: Tympanic membrane, ear canal and external ear normal.     Nose: Nose normal.     Mouth/Throat:     Pharynx: Uvula midline.  Eyes:     Conjunctiva/sclera: Conjunctivae normal.     Pupils: Pupils are equal, round, and reactive to light.  Neck:     Thyroid: No thyromegaly.     Vascular: No carotid bruit.  Cardiovascular:     Rate and Rhythm: Normal rate and regular rhythm.     Heart sounds: Normal heart sounds.  Pulmonary:     Effort: Pulmonary effort is normal.     Breath sounds: Normal breath sounds. No wheezing.  Chest:     Breasts:        Right: No mass.        Left: No mass.  Abdominal:     General: Bowel sounds are normal.  Palpations: Abdomen is soft.     Tenderness: There is no abdominal tenderness.  Musculoskeletal:        General: Normal range of motion.     Cervical back: Normal range of motion and neck supple.  Lymphadenopathy:     Cervical: No cervical adenopathy.  Skin:    General: Skin is warm and dry.  Neurological:     Mental Status: He is alert and oriented to person, place, and time.     Deep Tendon Reflexes: Reflexes are normal and symmetric.  Psychiatric:        Speech: Speech normal.        Behavior: Behavior normal.        Thought Content: Thought content normal.        Judgment: Judgment normal.     Wt Readings from Last 3 Encounters:  10/28/19 (!) 314 lb (142.4 kg)  06/17/19 (!) 326 lb 4 oz (148 kg)  01/31/19 (!) 335 lb (152 kg)    There were no vitals taken for this visit.  Assessment and Plan:

## 2020-02-15 ENCOUNTER — Ambulatory Visit (INDEPENDENT_AMBULATORY_CARE_PROVIDER_SITE_OTHER): Payer: PRIVATE HEALTH INSURANCE | Admitting: Internal Medicine

## 2020-02-15 ENCOUNTER — Encounter: Payer: Self-pay | Admitting: Internal Medicine

## 2020-02-15 ENCOUNTER — Other Ambulatory Visit: Payer: Self-pay

## 2020-02-15 VITALS — BP 128/70 | HR 84 | Temp 98.0°F | Ht 71.0 in | Wt 302.0 lb

## 2020-02-15 DIAGNOSIS — K047 Periapical abscess without sinus: Secondary | ICD-10-CM | POA: Diagnosis not present

## 2020-02-15 MED ORDER — CLINDAMYCIN HCL 300 MG PO CAPS: 300.0000 mg | ORAL_CAPSULE | Freq: Four times a day (QID) | ORAL | 0 refills | Status: AC

## 2020-02-15 MED ORDER — HYDROCODONE-ACETAMINOPHEN 5-325 MG PO TABS: 1.0000 | ORAL_TABLET | Freq: Four times a day (QID) | ORAL | 0 refills | Status: AC | PRN

## 2020-02-15 NOTE — Patient Instructions (Signed)
Rinse your mouth several times a day with warm salt water.

## 2020-02-15 NOTE — Progress Notes (Signed)
Date:  02/15/2020   Name:  Jon Mendez   DOB:  03/23/1960   MRN:  [id number]   Chief Complaint: Diabetes (Cannot get flu shot here because he is allergic to eggs. BS latley is running around 120s.) and Dental Pain (X 3 days since it started. Right abcess in mouth. 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 Knows he needs abx. )  Dental Pain  This is a new problem. The current episode started in the past 7 days. The problem occurs constantly. The problem has been rapidly worsening. The pain is moderate. Associated symptoms include difficulty swallowing, facial pain, oral bleeding and thermal sensitivity. Pertinent negatives include no fever. He has tried NSAIDs for the symptoms. The treatment provided mild relief.    Lab Results  Component Value Date   CREATININE 1.63 (H) 10/28/2019   BUN 16 10/28/2019   NA 137 10/28/2019   K 5.0 10/28/2019   CL 96 10/28/2019   CO2 23 10/28/2019   Lab Results  Component Value Date   CHOL 185 01/31/2019   HDL 45 01/31/2019   LDLCALC 108 (H) 01/31/2019   TRIG 186 (H) 01/31/2019   CHOLHDL 4.1 01/31/2019   Lab Results  Component Value Date   TSH 3.208 05/03/2018   Lab Results  Component Value Date   HGBA1C 8.9 (H) 10/28/2019   Lab Results  Component Value Date   WBC 12.7 (H) 01/31/2019   HGB 13.5 01/31/2019   HCT 40.9 01/31/2019   MCV 79 01/31/2019   PLT 263 01/31/2019   Lab Results  Component Value Date   ALT 15 10/28/2019   AST 15 10/28/2019   ALKPHOS 135 (H) 10/28/2019   BILITOT 0.3 10/28/2019     Review of Systems  Constitutional: Negative for chills, fatigue and fever.  HENT: Positive for dental problem and facial swelling. Negative for trouble swallowing.   Respiratory: Negative for chest tightness and shortness of breath.   Cardiovascular: Negative for chest pain.    Patient Active Problem List   Diagnosis Date Noted  . Paroxysmal atrial fibrillation ([place]) 06/17/2019  . Bronchitis 05/03/2018  . Type II  diabetes mellitus with complication ([place]) 01/21/5101  . Tubular adenoma of colon   . CKD stage G3a/A2, GFR 45-59 and albumin creatinine ratio 30-299 mg/g (HCC) 04/25/2015  . Chronic gouty arthritis 11/22/2014  . Hyperlipidemia associated with type 2 diabetes mellitus ([place]) 11/22/2014  . Essential (primary) hypertension 11/22/2014  . Compulsive tobacco user syndrome 11/22/2014  . Varicose veins of both lower extremities 11/22/2014  . Hidradenitis suppurativa 09/06/2012  . Benign paroxysmal positional nystagmus 07/29/2004    Allergies  Allergen Reactions  . Eggs Or Egg-Derived Products Shortness Of Breath    Throat swells. Patient able to eat products that contain egg just not whole egg alone.  . Fish Allergy Shortness Of Breath    Throat swells    Past Surgical History:  Procedure Laterality Date  . CARDIOVERSION N/A 06/17/2018   Procedure: CARDIOVERSION (CATH LAB);  Surgeon: Minna Merritts, MD;  Location: [facility];  Service: Cardiovascular;  Laterality: N/A;  . COLONOSCOPY WITH PROPOFOL N/A 06/04/2015   Procedure: COLONOSCOPY WITH PROPOFOL;  Surgeon: Lucilla Lame, MD;  5 tubular adenomas  . HIP ARTHROPLASTY Left 2013  . LEG SKIN LESION  BIOPSY / EXCISION Right 2015   Hidradenitis  . POLYPECTOMY  06/04/2015   Procedure: POLYPECTOMY;  Surgeon: Lucilla Lame, MD;  Location: [place];  Service: Endoscopy;;  . VARICOSE VEIN SURGERY  Bilateral 2015    Social History   Tobacco Use  . Smoking status: Current Every Day Smoker    Packs/day: 0.25    Years: 30.00    Pack years: 7.50    Types: Cigarettes  . Smokeless tobacco: Never Used  Vaping Use  . Vaping Use: Never assessed  Substance Use Topics  . Alcohol use: No    Alcohol/week: 4.0 standard drinks    Types: 4 Standard drinks or equivalent per week  . Drug use: No     Medication list has been reviewed and updated.  Current Meds  Medication Sig  . acyclovir (ZOVIRAX) 400 MG tablet Take 400 mg by mouth 2 (two)  times daily.  Marland Kitchen allopurinol (ZYLOPRIM) 100 MG tablet TAKE 1 TABLET BY MOUTH EVERY DAY  . amiodarone (PACERONE) 200 MG tablet Take 1 tablet (200 mg total) by mouth daily.  Marland Kitchen amLODipine (NORVASC) 5 MG tablet Take 1 tablet (5 mg total) by mouth daily.  Marland Kitchen diltiazem (CARDIZEM) 30 MG tablet Take 1 tablet (30 mg total) by mouth 2 (two) times daily.  . ertugliflozin L-PyroglutamicAc (STEGLATRO) 15 MG TABS tablet Take 1 tablet (15 mg total) by mouth daily before breakfast.  . glucose blood (FREESTYLE LITE) test strip Use as instructed  . losartan-hydrochlorothiazide (HYZAAR) 100-25 MG tablet Take 1 tablet by mouth daily.  . Multiple Vitamin (MULTIVITAMIN WITH MINERALS) TABS tablet Take 1 tablet by mouth daily. Complete Multivitamin  . pravastatin (PRAVACHOL) 40 MG tablet Take 1 tablet (40 mg total) by mouth daily.  . sildenafil (VIAGRA) 100 MG tablet Take 0.5-1 tablets (50-100 mg total) by mouth daily as needed for erectile dysfunction.  . SitaGLIPtin-MetFORMIN HCl (JANUMET XR) [phone] MG TB24 Take 1 tablet by mouth daily.  Marland Kitchen triamcinolone ointment (KENALOG) 0.1 % Apply topically 2 (two) times daily.    PHQ 2/9 Scores 02/15/2020 10/28/2019 06/24/2018 05/24/2018  PHQ - 2 Score 0 0 0 0  PHQ- 9 Score 0 2 - -    GAD 7 : Generalized Anxiety Score 02/15/2020 10/28/2019  Nervous, Anxious, on Edge 0 0  Control/stop worrying 0 -  Worry too much - different things 0 1  Trouble relaxing 0 0  Restless 0 0  Easily annoyed or irritable 0 0  Afraid - awful might happen 0 0  Total GAD 7 Score 0 -  Anxiety Difficulty Not difficult at all Not difficult at all    BP Readings from Last 3 Encounters:  02/15/20 128/70  10/28/19 112/78  06/17/19 132/76    Physical Exam Vitals and nursing note reviewed.  Constitutional:      General: He is not in acute distress.    Appearance: Normal appearance. He is well-developed.  HENT:     Head: Normocephalic and atraumatic.     Mouth/Throat:     Mouth: Mucous  membranes are moist.     Dentition: Dental tenderness, gingival swelling and dental caries present.      Comments: And bleeding Pulmonary:     Effort: Pulmonary effort is normal. No respiratory distress.  Musculoskeletal:        General: Normal range of motion.  Skin:    General: Skin is warm and dry.     Findings: No rash.  Neurological:     Mental Status: He is alert and oriented to person, place, and time.  Psychiatric:        Behavior: Behavior normal.        Thought Content: Thought content normal.  Wt Readings from Last 3 Encounters:  02/15/20 (!) 302 lb (137 kg)  10/28/19 (!) 314 lb (142.4 kg)  06/17/19 (!) 326 lb 4 oz (148 kg)    BP 128/70   Pulse 84   Temp 98 F (36.7 C) (Oral)   Ht 5\' 11"  (1.803 m)   Wt (!) 302 lb (137 kg)   SpO2 98%   BMI 42.12 kg/m   Assessment and Plan: 1. Dental infection Recommend saline rinses several times per day Definitive dental treatment will be needed. - clindamycin (CLEOCIN) 300 MG capsule; Take 1 capsule (300 mg total) by mouth 4 (four) times daily for 10 days.  Dispense: 40 capsule; Refill: 0 - HYDROcodone-acetaminophen (NORCO/VICODIN) 5-325 MG tablet; Take 1 tablet by mouth every 6 (six) hours as needed for up to 5 days for moderate pain.  Dispense: 20 tablet; Refill: 0   Partially dictated using Editor, commissioning. Any errors are unintentional.  Halina Maidens, MD [facility]  02/15/2020

## 2020-03-05 ENCOUNTER — Ambulatory Visit (INDEPENDENT_AMBULATORY_CARE_PROVIDER_SITE_OTHER): Payer: PRIVATE HEALTH INSURANCE | Admitting: Internal Medicine

## 2020-03-05 ENCOUNTER — Other Ambulatory Visit: Payer: Self-pay

## 2020-03-05 ENCOUNTER — Encounter: Payer: Self-pay | Admitting: Internal Medicine

## 2020-03-05 VITALS — BP 112/76 | HR 80 | Temp 97.8°F | Ht 71.0 in | Wt 305.0 lb

## 2020-03-05 DIAGNOSIS — N1831 Chronic kidney disease, stage 3a: Secondary | ICD-10-CM

## 2020-03-05 DIAGNOSIS — I10 Essential (primary) hypertension: Secondary | ICD-10-CM | POA: Diagnosis not present

## 2020-03-05 DIAGNOSIS — E118 Type 2 diabetes mellitus with unspecified complications: Secondary | ICD-10-CM

## 2020-03-05 MED ORDER — STEGLATRO 15 MG PO TABS: 15.0000 mg | ORAL_TABLET | Freq: Every day | ORAL | 5 refills | Status: DC

## 2020-03-05 MED ORDER — JANUMET XR 100-1000 MG PO TB24: 1.0000 | ORAL_TABLET | Freq: Every day | ORAL | 5 refills | Status: DC

## 2020-03-05 NOTE — Progress Notes (Signed)
Date:  03/05/2020   Name:  Jon Mendez   DOB:  03/23/1960   MRN:  [id number]   Chief Complaint: Diabetes (last reading X3 days ago 131)  Diabetes He presents for his follow-up diabetic visit. He has type 2 diabetes mellitus. His disease course has been stable. Pertinent negatives for hypoglycemia include no headaches or tremors. Pertinent negatives for diabetes include no chest pain, no fatigue, no polydipsia and no polyuria. Current diabetic treatment includes oral agent (triple therapy) (Janumet and steglatro added after last visit). He is compliant with treatment most of the time. He is following a generally healthy diet. He participates in exercise daily (walking). His breakfast blood glucose is taken between 6-7 am. His breakfast blood glucose range is generally 130-140 mg/dl. An ACE inhibitor/angiotensin II receptor blocker is being taken.  Hypertension This is a chronic problem. The problem is controlled. Pertinent negatives include no chest pain, headaches, palpitations or shortness of breath. Past treatments include diuretics, calcium channel blockers and beta blockers. The current treatment provides significant improvement. Hypertensive end-organ damage includes kidney disease.  CKD - his GFR has been stable around 40-45 for several years.  No long term use of nsaids noted. He has not been followed by Nephrology at this time.  Lab Results  Component Value Date   CREATININE 1.63 (H) 10/28/2019   BUN 16 10/28/2019   NA 137 10/28/2019   K 5.0 10/28/2019   CL 96 10/28/2019   CO2 23 10/28/2019   Lab Results  Component Value Date   CHOL 185 01/31/2019   HDL 45 01/31/2019   LDLCALC 108 (H) 01/31/2019   TRIG 186 (H) 01/31/2019   CHOLHDL 4.1 01/31/2019   Lab Results  Component Value Date   TSH 3.208 05/03/2018   Lab Results  Component Value Date   HGBA1C 8.9 (H) 10/28/2019   Lab Results  Component Value Date   WBC 12.7 (H) 01/31/2019   HGB 13.5 01/31/2019   HCT  40.9 01/31/2019   MCV 79 01/31/2019   PLT 263 01/31/2019   Lab Results  Component Value Date   ALT 15 10/28/2019   AST 15 10/28/2019   ALKPHOS 135 (H) 10/28/2019   BILITOT 0.3 10/28/2019     Review of Systems  Constitutional: Negative for appetite change, fatigue and unexpected weight change.  Eyes: Negative for visual disturbance.  Respiratory: Negative for cough, shortness of breath and wheezing.   Cardiovascular: Negative for chest pain, palpitations and leg swelling.  Gastrointestinal: Negative for abdominal pain and blood in stool.  Endocrine: Negative for polydipsia and polyuria.  Genitourinary: Negative for dysuria and hematuria.  Skin: Negative for color change and rash.  Neurological: Negative for tremors, numbness and headaches.  Psychiatric/Behavioral: Negative for dysphoric mood.    Patient Active Problem List   Diagnosis Date Noted  . Paroxysmal atrial fibrillation ([place]) 06/17/2019  . Bronchitis 05/03/2018  . Type II diabetes mellitus with complication ([place]) 84/66/5993  . Tubular adenoma of colon   . CKD stage G3a/A2, GFR 45-59 and albumin creatinine ratio 30-299 mg/g (HCC) 04/25/2015  . Chronic gouty arthritis 11/22/2014  . Hyperlipidemia associated with type 2 diabetes mellitus ([place]) 11/22/2014  . Essential (primary) hypertension 11/22/2014  . Compulsive tobacco user syndrome 11/22/2014  . Varicose veins of both lower extremities 11/22/2014  . Hidradenitis suppurativa 09/06/2012  . Benign paroxysmal positional nystagmus 07/29/2004    Allergies  Allergen Reactions  . Eggs Or Egg-Derived Products Shortness Of Breath    Throat swells.  Patient able to eat products that contain egg just not whole egg alone.  . Fish Allergy Shortness Of Breath    Throat swells    Past Surgical History:  Procedure Laterality Date  . CARDIOVERSION N/A 06/17/2018   Procedure: CARDIOVERSION (CATH LAB);  Surgeon: Minna Merritts, MD;  Location: [facility];  Service:  Cardiovascular;  Laterality: N/A;  . COLONOSCOPY WITH PROPOFOL N/A 06/04/2015   Procedure: COLONOSCOPY WITH PROPOFOL;  Surgeon: Lucilla Lame, MD;  5 tubular adenomas  . HIP ARTHROPLASTY Left 2013  . LEG SKIN LESION  BIOPSY / EXCISION Right 2015   Hidradenitis  . POLYPECTOMY  06/04/2015   Procedure: POLYPECTOMY;  Surgeon: Lucilla Lame, MD;  Location: [place];  Service: Endoscopy;;  . VARICOSE VEIN SURGERY Bilateral 2015    Social History   Tobacco Use  . Smoking status: Current Every Day Smoker    Packs/day: 0.25    Years: 30.00    Pack years: 7.50    Types: Cigarettes  . Smokeless tobacco: Never Used  Vaping Use  . Vaping Use: Never assessed  Substance Use Topics  . Alcohol use: No    Alcohol/week: 4.0 standard drinks    Types: 4 Standard drinks or equivalent per week  . Drug use: No     Medication list has been reviewed and updated.  Current Meds  Medication Sig  . acyclovir (ZOVIRAX) 400 MG tablet Take 400 mg by mouth 2 (two) times daily.  Marland Kitchen allopurinol (ZYLOPRIM) 100 MG tablet TAKE 1 TABLET BY MOUTH EVERY DAY  . amiodarone (PACERONE) 200 MG tablet Take 1 tablet (200 mg total) by mouth daily.  Marland Kitchen amLODipine (NORVASC) 5 MG tablet Take 1 tablet (5 mg total) by mouth daily.  Marland Kitchen diltiazem (CARDIZEM) 30 MG tablet Take 1 tablet (30 mg total) by mouth 2 (two) times daily.  . ertugliflozin L-PyroglutamicAc (STEGLATRO) 15 MG TABS tablet Take 1 tablet (15 mg total) by mouth daily before breakfast.  . glucose blood (FREESTYLE LITE) test strip Use as instructed  . losartan-hydrochlorothiazide (HYZAAR) 100-25 MG tablet Take 1 tablet by mouth daily.  . metoprolol succinate (TOPROL-XL) 100 MG 24 hr tablet Take 1 tablet (100 mg total) by mouth daily for 30 days. Take with or immediately following a meal.  . Multiple Vitamin (MULTIVITAMIN WITH MINERALS) TABS tablet Take 1 tablet by mouth daily. Complete Multivitamin  . pravastatin (PRAVACHOL) 40 MG tablet Take 1 tablet (40 mg  total) by mouth daily.  . rivaroxaban (XARELTO) 20 MG TABS tablet Take 1 tablet (20 mg total) by mouth daily.  . sildenafil (VIAGRA) 100 MG tablet Take 0.5-1 tablets (50-100 mg total) by mouth daily as needed for erectile dysfunction.  . SitaGLIPtin-MetFORMIN HCl (JANUMET XR) [phone] MG TB24 Take 1 tablet by mouth daily.  Marland Kitchen triamcinolone ointment (KENALOG) 0.1 % Apply topically 2 (two) times daily.    PHQ 2/9 Scores 03/05/2020 02/15/2020 10/28/2019 06/24/2018  PHQ - 2 Score 0 0 0 0  PHQ- 9 Score 0 0 2 -    GAD 7 : Generalized Anxiety Score 03/05/2020 02/15/2020 10/28/2019  Nervous, Anxious, on Edge 0 0 0  Control/stop worrying 0 0 -  Worry too much - different things 0 0 1  Trouble relaxing 0 0 0  Restless 0 0 0  Easily annoyed or irritable 0 0 0  Afraid - awful might happen 0 0 0  Total GAD 7 Score 0 0 -  Anxiety Difficulty - Not difficult at all Not difficult at  all    BP Readings from Last 3 Encounters:  03/05/20 112/76  02/15/20 128/70  10/28/19 112/78    Physical Exam Vitals and nursing note reviewed.  Constitutional:      General: He is not in acute distress.    Appearance: He is well-developed. He is obese.  HENT:     Head: Normocephalic and atraumatic.  Cardiovascular:     Rate and Rhythm: Normal rate and regular rhythm.     Pulses: Normal pulses.     Heart sounds: No murmur heard.   Pulmonary:     Effort: Pulmonary effort is normal. No respiratory distress.     Breath sounds: No wheezing or rhonchi.  Musculoskeletal:        General: Normal range of motion.     Cervical back: Normal range of motion.     Right lower leg: No edema.     Left lower leg: No edema.  Lymphadenopathy:     Cervical: No cervical adenopathy.  Skin:    Capillary Refill: Capillary refill takes less than 2 seconds.     Findings: No rash.  Neurological:     General: No focal deficit present.     Mental Status: He is alert and oriented to person, place, and time.  Psychiatric:         Mood and Affect: Mood normal.     Wt Readings from Last 3 Encounters:  03/05/20 (!) 305 lb (138.3 kg)  02/15/20 (!) 302 lb (137 kg)  10/28/19 (!) 314 lb (142.4 kg)    BP 112/76   Pulse 80   Temp 97.8 F (36.6 C) (Oral)   Ht 5\' 11"  (1.803 m)   Wt (!) 305 lb (138.3 kg)   SpO2 98%   BMI 42.54 kg/m   Assessment and Plan: 1. Type II diabetes mellitus with complication (HCC) Clinically stable by exam and report without s/s of hypoglycemia. DM complicated by HTN. Tolerating medications well without side effects or other concerns.  He is losing weight with diet changes and the addition of Steglatro. - Hemoglobin A1c - ertugliflozin L-PyroglutamicAc (STEGLATRO) 15 MG TABS tablet; Take 1 tablet (15 mg total) by mouth daily before breakfast.  Dispense: 30 tablet; Refill: 5 - SitaGLIPtin-MetFORMIN HCl (JANUMET XR) [phone] MG TB24; Take 1 tablet by mouth daily.  Dispense: 30 tablet; Refill: 5  2. CKD stage G3a/A2, GFR 45-59 and albumin creatinine ratio 30-299 mg/g (HCC) Monitoring GFR due to SGLT-2 use Continue to avoid nsaids; tylenol is acceptable - Basic metabolic panel  3. Essential (primary) hypertension Clinically stable exam with well controlled BP. Tolerating medications without side effects at this time. Pt to continue current regimen and low sodium diet; benefits of regular exercise as able discussed.   Partially dictated using Editor, commissioning. Any errors are unintentional.  Halina Maidens, MD [facility]  03/05/2020

## 2020-03-06 ENCOUNTER — Other Ambulatory Visit: Payer: Self-pay | Admitting: Internal Medicine

## 2020-03-06 LAB — BASIC METABOLIC PANEL
BUN/Creatinine Ratio: 11 (ref 9–20)
BUN: 24 mg/dL (ref 6–24)
CO2: 20 mmol/L (ref 20–29)
Calcium: 10.2 mg/dL (ref 8.7–10.2)
Chloride: 100 mmol/L (ref 96–106)
Creatinine, Ser: 2.1 mg/dL — ABNORMAL HIGH (ref 0.76–1.27)
GFR calc Af Amer: 39 mL/min/{1.73_m2} — ABNORMAL LOW (ref >59)
GFR calc non Af Amer: 33 mL/min/{1.73_m2} — ABNORMAL LOW (ref >59)
Glucose: 159 mg/dL — ABNORMAL HIGH (ref 65–99)
Potassium: 4.8 mmol/L (ref 3.5–5.2)
Sodium: 138 mmol/L (ref 134–144)

## 2020-03-06 LAB — HEMOGLOBIN A1C
Est. average glucose Bld gHb Est-mCnc: 229 mg/dL
Hgb A1c MFr Bld: 9.6 % — ABNORMAL HIGH (ref 4.8–5.6)

## 2020-03-13 ENCOUNTER — Ambulatory Visit (INDEPENDENT_AMBULATORY_CARE_PROVIDER_SITE_OTHER): Payer: PRIVATE HEALTH INSURANCE | Admitting: Internal Medicine

## 2020-03-13 ENCOUNTER — Encounter: Payer: Self-pay | Admitting: Internal Medicine

## 2020-03-13 ENCOUNTER — Other Ambulatory Visit: Payer: Self-pay

## 2020-03-13 VITALS — BP 110/78 | HR 81 | Temp 98.2°F | Ht 71.0 in | Wt 301.0 lb

## 2020-03-13 DIAGNOSIS — N1831 Chronic kidney disease, stage 3a: Secondary | ICD-10-CM | POA: Diagnosis not present

## 2020-03-13 DIAGNOSIS — E118 Type 2 diabetes mellitus with unspecified complications: Secondary | ICD-10-CM | POA: Diagnosis not present

## 2020-03-13 MED ORDER — OZEMPIC (1 MG/DOSE) 2 MG/1.5ML ~~LOC~~ SOPN: 1.0000 mg | PEN_INJECTOR | SUBCUTANEOUS | 1 refills | Status: DC

## 2020-03-13 NOTE — Patient Instructions (Addendum)
Ozempic dose   0.25 once a week for 4 weeks.   Call for a prescription  Then increase to   0.5 once a week - sample has enough for 2 more weeks  The final dose will be 1 mg

## 2020-03-13 NOTE — Progress Notes (Signed)
Date:  03/13/2020   Name:  Jon Mendez   DOB:  03/23/1960   MRN:  [id number]   Chief Complaint: Diabetes (discuss new medication (injection))  Diabetes He presents for his follow-up diabetic visit. He has type 2 diabetes mellitus. His disease course has been worsening. Pertinent negatives for hypoglycemia include no dizziness or headaches. Pertinent negatives for diabetes include no fatigue. Diabetic complications include nephropathy. (GFR is worse so SGLT-2 has to be stopped) Current diabetic treatment includes oral agent (dual therapy) (janumet). He is compliant with treatment all of the time. An ACE inhibitor/angiotensin II receptor blocker is being taken.  CKD - GFR is declining steadily over the past year.  He has seen nephrology several years ago but has not had ongoing care.   Lab Results  Component Value Date   CREATININE 2.10 (H) 03/05/2020   BUN 24 03/05/2020   NA 138 03/05/2020   K 4.8 03/05/2020   CL 100 03/05/2020   CO2 20 03/05/2020   Lab Results  Component Value Date   CHOL 185 01/31/2019   HDL 45 01/31/2019   LDLCALC 108 (H) 01/31/2019   TRIG 186 (H) 01/31/2019   CHOLHDL 4.1 01/31/2019   Lab Results  Component Value Date   TSH 3.208 05/03/2018   Lab Results  Component Value Date   HGBA1C 9.6 (H) 03/05/2020   Lab Results  Component Value Date   WBC 12.7 (H) 01/31/2019   HGB 13.5 01/31/2019   HCT 40.9 01/31/2019   MCV 79 01/31/2019   PLT 263 01/31/2019   Lab Results  Component Value Date   ALT 15 10/28/2019   AST 15 10/28/2019   ALKPHOS 135 (H) 10/28/2019   BILITOT 0.3 10/28/2019     Review of Systems  Constitutional: Negative for chills, fatigue and fever.  Respiratory: Negative for chest tightness and shortness of breath.   Neurological: Negative for dizziness and headaches.    Patient Active Problem List   Diagnosis Date Noted  . Paroxysmal atrial fibrillation ([place]) 06/17/2019  . Bronchitis 05/03/2018  . Type II diabetes  mellitus with complication ([place]) 37/01/6268  . Tubular adenoma of colon   . CKD stage G3a/A2, GFR 45-59 and albumin creatinine ratio 30-299 mg/g (HCC) 04/25/2015  . Chronic gouty arthritis 11/22/2014  . Hyperlipidemia associated with type 2 diabetes mellitus ([place]) 11/22/2014  . Essential (primary) hypertension 11/22/2014  . Compulsive tobacco user syndrome 11/22/2014  . Varicose veins of both lower extremities 11/22/2014  . Hidradenitis suppurativa 09/06/2012  . Benign paroxysmal positional nystagmus 07/29/2004    Allergies  Allergen Reactions  . Eggs Or Egg-Derived Products Shortness Of Breath    Throat swells. Patient able to eat products that contain egg just not whole egg alone.  . Fish Allergy Shortness Of Breath    Throat swells    Past Surgical History:  Procedure Laterality Date  . CARDIOVERSION N/A 06/17/2018   Procedure: CARDIOVERSION (CATH LAB);  Surgeon: Minna Merritts, MD;  Location: [facility];  Service: Cardiovascular;  Laterality: N/A;  . COLONOSCOPY WITH PROPOFOL N/A 06/04/2015   Procedure: COLONOSCOPY WITH PROPOFOL;  Surgeon: Lucilla Lame, MD;  5 tubular adenomas  . HIP ARTHROPLASTY Left 2013  . LEG SKIN LESION  BIOPSY / EXCISION Right 2015   Hidradenitis  . POLYPECTOMY  06/04/2015   Procedure: POLYPECTOMY;  Surgeon: Lucilla Lame, MD;  Location: [place];  Service: Endoscopy;;  . VARICOSE VEIN SURGERY Bilateral 2015    Social History   Tobacco Use  .  Smoking status: Current Every Day Smoker    Packs/day: 0.25    Years: 30.00    Pack years: 7.50    Types: Cigarettes  . Smokeless tobacco: Never Used  Vaping Use  . Vaping Use: Never assessed  Substance Use Topics  . Alcohol use: No    Alcohol/week: 4.0 standard drinks    Types: 4 Standard drinks or equivalent per week  . Drug use: No     Medication list has been reviewed and updated.  Current Meds  Medication Sig  . acyclovir (ZOVIRAX) 400 MG tablet Take 400 mg by mouth 2 (two) times  daily.  Marland Kitchen allopurinol (ZYLOPRIM) 100 MG tablet TAKE 1 TABLET BY MOUTH EVERY DAY  . amiodarone (PACERONE) 200 MG tablet Take 1 tablet (200 mg total) by mouth daily.  Marland Kitchen amLODipine (NORVASC) 5 MG tablet Take 1 tablet (5 mg total) by mouth daily.  Marland Kitchen diltiazem (CARDIZEM) 30 MG tablet Take 1 tablet (30 mg total) by mouth 2 (two) times daily.  . ertugliflozin L-PyroglutamicAc (STEGLATRO) 15 MG TABS tablet Take 1 tablet (15 mg total) by mouth daily before breakfast.  . glucose blood (FREESTYLE LITE) test strip Use as instructed  . losartan-hydrochlorothiazide (HYZAAR) 100-25 MG tablet Take 1 tablet by mouth daily.  . metoprolol succinate (TOPROL-XL) 100 MG 24 hr tablet Take 1 tablet (100 mg total) by mouth daily for 30 days. Take with or immediately following a meal.  . Multiple Vitamin (MULTIVITAMIN WITH MINERALS) TABS tablet Take 1 tablet by mouth daily. Complete Multivitamin  . pravastatin (PRAVACHOL) 40 MG tablet Take 1 tablet (40 mg total) by mouth daily.  . rivaroxaban (XARELTO) 20 MG TABS tablet Take 1 tablet (20 mg total) by mouth daily.  . sildenafil (VIAGRA) 100 MG tablet Take 0.5-1 tablets (50-100 mg total) by mouth daily as needed for erectile dysfunction.  . SitaGLIPtin-MetFORMIN HCl (JANUMET XR) [phone] MG TB24 Take 1 tablet by mouth daily.  Marland Kitchen triamcinolone ointment (KENALOG) 0.1 % Apply topically 2 (two) times daily.    PHQ 2/9 Scores 03/13/2020 03/05/2020 02/15/2020 10/28/2019  PHQ - 2 Score 0 0 0 0  PHQ- 9 Score 0 0 0 2    GAD 7 : Generalized Anxiety Score 03/13/2020 03/05/2020 02/15/2020 10/28/2019  Nervous, Anxious, on Edge 0 0 0 0  Control/stop worrying 0 0 0 -  Worry too much - different things 0 0 0 1  Trouble relaxing 0 0 0 0  Restless 0 0 0 0  Easily annoyed or irritable 0 0 0 0  Afraid - awful might happen 0 0 0 0  Total GAD 7 Score 0 0 0 -  Anxiety Difficulty - - Not difficult at all Not difficult at all    BP Readings from Last 3 Encounters:  03/13/20 110/78   03/05/20 112/76  02/15/20 128/70    Physical Exam Vitals and nursing note reviewed.  Constitutional:      General: He is not in acute distress.    Appearance: He is well-developed.  HENT:     Head: Normocephalic and atraumatic.  Pulmonary:     Effort: Pulmonary effort is normal. No respiratory distress.  Musculoskeletal:        General: Normal range of motion.  Skin:    General: Skin is warm and dry.     Findings: No rash.  Neurological:     Mental Status: He is alert and oriented to person, place, and time.  Psychiatric:        Behavior:  Behavior normal.        Thought Content: Thought content normal.     Wt Readings from Last 3 Encounters:  03/13/20 (!) 301 lb (136.5 kg)  03/05/20 (!) 305 lb (138.3 kg)  02/15/20 (!) 302 lb (137 kg)    BP 110/78   Pulse 81   Temp 98.2 F (36.8 C) (Oral)   Ht 5\' 11"  (1.803 m)   Wt (!) 301 lb (136.5 kg)   SpO2 98%   BMI 41.98 kg/m   Assessment and Plan: 1. CKD stage G3a/A2, GFR 45-59 and albumin creatinine ratio 30-299 mg/g (HCC) Declining GFR - Ambulatory referral to Nephrology  2. Type II diabetes mellitus with complication (HCC) Continue Janumet Stop Steglatro Begin Ozempic 0.25 mg x 4 weeks, 0.5 x 2 weeks then 1 mg weekly - sample given - Semaglutide, 1 MG/DOSE, (OZEMPIC, 1 MG/DOSE,) 2 MG/1.5ML SOPN; Inject 1 mg into the skin once a week.  Dispense: 4.5 mL; Refill: 1   Partially dictated using Editor, commissioning. Any errors are unintentional.  Halina Maidens, MD [facility]  03/13/2020

## 2020-04-12 ENCOUNTER — Telehealth: Payer: Self-pay | Admitting: Internal Medicine

## 2020-04-12 DIAGNOSIS — E118 Type 2 diabetes mellitus with unspecified complications: Secondary | ICD-10-CM

## 2020-04-12 NOTE — Telephone Encounter (Signed)
Patient wife is calling to request script for ozempic to be sent to [place], [place]. Patient was given samples of ozempic and was told to call for a script when on his last dose. Please advise Cb- [phone]

## 2020-04-13 MED ORDER — OZEMPIC (1 MG/DOSE) 2 MG/1.5ML ~~LOC~~ SOPN: 1.0000 mg | PEN_INJECTOR | SUBCUTANEOUS | 1 refills | Status: DC

## 2020-04-17 NOTE — Telephone Encounter (Signed)
Pts wife is calling back about the status of the request for a RX for Ozempic / Pt was advised to call and request RX when they were on their last dose/ Pt was not sent any or contacted about the request/ please advise asap or send asap to  [id number] - [place], [place] Phone:  [phone]  Fax:  [phone]

## 2020-04-17 NOTE — Telephone Encounter (Signed)
It was an injection pen

## 2020-04-18 ENCOUNTER — Telehealth: Payer: Self-pay | Admitting: Internal Medicine

## 2020-04-18 NOTE — Telephone Encounter (Signed)
Spoke to pt let him know that the medication was sent in 04/13/2020. Pt verbalized understanding and will call pharmacy to check.  KP

## 2020-04-18 NOTE — Telephone Encounter (Signed)
Pt wife Helene Kelp is calling and pt can not afford janumet cost 500.00 for 30 day supply. Pt wife would like to know if provider has a coupon if not please change to different medication. [facility] [address]

## 2020-04-18 NOTE — Telephone Encounter (Signed)
Is he using Ozempic injections weekly?  If so, we can substitute metformin for the Janumet.

## 2020-04-18 NOTE — Telephone Encounter (Signed)
Please Review.  KP

## 2020-04-19 ENCOUNTER — Ambulatory Visit: Payer: Self-pay | Admitting: *Deleted

## 2020-04-19 NOTE — Telephone Encounter (Signed)
Pt has been taking sample Ozempic injections. Pt stated that he used the last sample this week. Pt stated  Ozempic at the pharmacy will cost 900$. Will not be able to afford. Pt wants metformin as well.  KP

## 2020-04-19 NOTE — Telephone Encounter (Signed)
Pt called in c/o a lot of pain in his right ear and right side of his face with swelling.   There is a knot coming from inside the ear canal that is so swollen you can't see inside the canal (per wife who was there with him).  He had a abscess about 6 wks ago on this same side of his face but it was closer to the front of his mouth/face.   He took an antibiotic and pain medication for it and cleared it up.  Dr. Army Melia does not have any openings so I'm sending a high priority note to [facility] for a call back pertaining to an appt.  He was agreeable to this plan.  Number in chart is best number to call.  Reason for Disposition . Swelling is painful to touch  Answer Assessment - Initial Assessment Questions 1. ONSET: "When did the swelling start?" (e.g., minutes, hours, days)     He has swelling on the right side of his face. 2. LOCATION: "What part of the face is swollen?"     Inside right ear is a knot that is so swollen you can't see inside the hole.   The lobe is swollen.    3. SEVERITY: "How swollen is it?"     A knot inside the ear. When I dried my ear after taking a shower that's when it started hurting and swelling.  My hearing is affected. 4. ITCHING: "Is there any itching?" If Yes, ask: "How much?"   (Scale 1-10; mild, moderate or severe)     A little itching 5. PAIN: "Is the swelling painful to touch?" If Yes, ask: "How painful is it?"   (Scale 1-10; mild, moderate or severe)     No drainage just swelling with a lot of pain. 8 on pain scale 6. FEVER: "Do you have a fever?" If Yes, ask: "What is it, how was it measured, and when did it start?"      No 7. CAUSE: "What do you think is causing the face swelling?"     I don't know   Maybe irritated it when drying my ear. 8. RECURRENT SYMPTOM: "Have you had face swelling before?" If Yes, ask: "When was the last time?" "What happened that time?"     Yes I took antibiotics and pain medicine about 6 weeks ago.   9. OTHER  SYMPTOMS: "Do you have any other symptoms?" (e.g., toothache, leg swelling)     I did have an abscess on the same side more towards the front of my mouth.   I think the abscess may be causing it but I don't think. 10. PREGNANCY: "Is there any chance you are pregnant?" "When was your last menstrual period?"       N/A  Protocols used: [facility]

## 2020-04-19 NOTE — Telephone Encounter (Signed)
He may have to start insulin injections if he insurance does not cover these medications.  Does he have a formulary booklet that came with his insurance policy?  He may need a visit to go over his options - they are limited by his kidney disease as well.

## 2020-04-19 NOTE — Telephone Encounter (Signed)
Spoke with wife. She said that they are unemployed and are about to lose the insurance they have now. She stated that she has plans for getting new insurance but it will not take effect until Feb. Pt has appt tomorrow 04/19/2020. Will try to discuss options at this visit. Pt wife is unsure about formulary booklet. Pt verbalized understanding and is aware of possibly taking insulin.  KP

## 2020-04-20 ENCOUNTER — Other Ambulatory Visit: Payer: Self-pay

## 2020-04-20 ENCOUNTER — Ambulatory Visit (INDEPENDENT_AMBULATORY_CARE_PROVIDER_SITE_OTHER): Payer: PRIVATE HEALTH INSURANCE | Admitting: Internal Medicine

## 2020-04-20 ENCOUNTER — Encounter: Payer: Self-pay | Admitting: Internal Medicine

## 2020-04-20 VITALS — BP 106/72 | HR 101 | Temp 98.4°F | Ht 71.0 in | Wt 298.0 lb

## 2020-04-20 DIAGNOSIS — E118 Type 2 diabetes mellitus with unspecified complications: Secondary | ICD-10-CM

## 2020-04-20 DIAGNOSIS — L0201 Cutaneous abscess of face: Secondary | ICD-10-CM

## 2020-04-20 MED ORDER — HYDROCODONE-ACETAMINOPHEN 5-325 MG PO TABS: 1.0000 | ORAL_TABLET | Freq: Four times a day (QID) | ORAL | 0 refills | Status: AC | PRN

## 2020-04-20 MED ORDER — AMOXICILLIN-POT CLAVULANATE 875-125 MG PO TABS: 1.0000 | ORAL_TABLET | Freq: Two times a day (BID) | ORAL | 0 refills | Status: AC

## 2020-04-20 NOTE — Progress Notes (Signed)
Date:  04/20/2020   Name:  Jon Mendez   DOB:  03/23/1960   MRN:  [id number]   Chief Complaint: Ear Pain (X2-3 days, Right side, drainage, throbbing)  Otalgia  There is pain in the right ear. This is a new problem. The current episode started in the past 7 days. The problem occurs constantly. The problem has been unchanged. There has been no fever.  Right side of face near ear was very swollen yesterday.  Last night it was painful and drained some blood and pus.  Hearing is decreased.    Lab Results  Component Value Date   CREATININE 2.10 (H) 03/05/2020   BUN 24 03/05/2020   NA 138 03/05/2020   K 4.8 03/05/2020   CL 100 03/05/2020   CO2 20 03/05/2020   Lab Results  Component Value Date   CHOL 185 01/31/2019   HDL 45 01/31/2019   LDLCALC 108 (H) 01/31/2019   TRIG 186 (H) 01/31/2019   CHOLHDL 4.1 01/31/2019   Lab Results  Component Value Date   TSH 3.208 05/03/2018   Lab Results  Component Value Date   HGBA1C 9.6 (H) 03/05/2020   Lab Results  Component Value Date   WBC 12.7 (H) 01/31/2019   HGB 13.5 01/31/2019   HCT 40.9 01/31/2019   MCV 79 01/31/2019   PLT 263 01/31/2019   Lab Results  Component Value Date   ALT 15 10/28/2019   AST 15 10/28/2019   ALKPHOS 135 (H) 10/28/2019   BILITOT 0.3 10/28/2019     Review of Systems  Constitutional: Negative for chills, fatigue and fever.  HENT: Positive for ear pain and facial swelling.     Patient Active Problem List   Diagnosis Date Noted  . Paroxysmal atrial fibrillation ([place]) 06/17/2019  . Bronchitis 05/03/2018  . Type II diabetes mellitus with complication ([place]) 54/49/2010  . Tubular adenoma of colon   . CKD stage G3a/A2, GFR 45-59 and albumin creatinine ratio 30-299 mg/g (HCC) 04/25/2015  . Chronic gouty arthritis 11/22/2014  . Hyperlipidemia associated with type 2 diabetes mellitus ([place]) 11/22/2014  . Essential (primary) hypertension 11/22/2014  . Compulsive tobacco user syndrome 11/22/2014  .  Varicose veins of both lower extremities 11/22/2014  . Hidradenitis suppurativa 09/06/2012  . Benign paroxysmal positional nystagmus 07/29/2004    Allergies  Allergen Reactions  . Eggs Or Egg-Derived Products Shortness Of Breath    Throat swells. Patient able to eat products that contain egg just not whole egg alone.  . Fish Allergy Shortness Of Breath    Throat swells    Past Surgical History:  Procedure Laterality Date  . CARDIOVERSION N/A 06/17/2018   Procedure: CARDIOVERSION (CATH LAB);  Surgeon: Minna Merritts, MD;  Location: [facility];  Service: Cardiovascular;  Laterality: N/A;  . COLONOSCOPY WITH PROPOFOL N/A 06/04/2015   Procedure: COLONOSCOPY WITH PROPOFOL;  Surgeon: Lucilla Lame, MD;  5 tubular adenomas  . HIP ARTHROPLASTY Left 2013  . LEG SKIN LESION  BIOPSY / EXCISION Right 2015   Hidradenitis  . POLYPECTOMY  06/04/2015   Procedure: POLYPECTOMY;  Surgeon: Lucilla Lame, MD;  Location: [place];  Service: Endoscopy;;  . VARICOSE VEIN SURGERY Bilateral 2015    Social History   Tobacco Use  . Smoking status: Current Every Day Smoker    Packs/day: 0.25    Years: 30.00    Pack years: 7.50    Types: Cigarettes  . Smokeless tobacco: Never Used  Substance Use Topics  .  Alcohol use: No    Alcohol/week: 4.0 standard drinks    Types: 4 Standard drinks or equivalent per week  . Drug use: No     Medication list has been reviewed and updated.  Current Meds  Medication Sig  . acyclovir (ZOVIRAX) 400 MG tablet Take 400 mg by mouth 2 (two) times daily.  Marland Kitchen allopurinol (ZYLOPRIM) 100 MG tablet TAKE 1 TABLET BY MOUTH EVERY DAY  . amiodarone (PACERONE) 200 MG tablet Take 1 tablet (200 mg total) by mouth daily.  Marland Kitchen amLODipine (NORVASC) 5 MG tablet Take 1 tablet (5 mg total) by mouth daily.  Marland Kitchen diltiazem (CARDIZEM) 30 MG tablet Take 1 tablet (30 mg total) by mouth 2 (two) times daily.  Marland Kitchen glucose blood (FREESTYLE LITE) test strip Use as instructed  .  losartan-hydrochlorothiazide (HYZAAR) 100-25 MG tablet Take 1 tablet by mouth daily.  . Multiple Vitamin (MULTIVITAMIN WITH MINERALS) TABS tablet Take 1 tablet by mouth daily. Complete Multivitamin  . pravastatin (PRAVACHOL) 40 MG tablet Take 1 tablet (40 mg total) by mouth daily.  . rivaroxaban (XARELTO) 20 MG TABS tablet Take 1 tablet (20 mg total) by mouth daily.  . Semaglutide, 1 MG/DOSE, (OZEMPIC, 1 MG/DOSE,) 2 MG/1.5ML SOPN Inject 1 mg into the skin once a week.  . sildenafil (VIAGRA) 100 MG tablet Take 0.5-1 tablets (50-100 mg total) by mouth daily as needed for erectile dysfunction.  . SitaGLIPtin-MetFORMIN HCl (JANUMET XR) [phone] MG TB24 Take 1 tablet by mouth daily.  Marland Kitchen triamcinolone ointment (KENALOG) 0.1 % Apply topically 2 (two) times daily.    PHQ 2/9 Scores 04/20/2020 03/13/2020 03/05/2020 02/15/2020  PHQ - 2 Score 0 0 0 0  PHQ- 9 Score 0 0 0 0    GAD 7 : Generalized Anxiety Score 04/20/2020 03/13/2020 03/05/2020 02/15/2020  Nervous, Anxious, on Edge 0 0 0 0  Control/stop worrying 0 0 0 0  Worry too much - different things 0 0 0 0  Trouble relaxing 0 0 0 0  Restless 0 0 0 0  Easily annoyed or irritable 0 0 0 0  Afraid - awful might happen 0 0 0 0  Total GAD 7 Score 0 0 0 0  Anxiety Difficulty - - - Not difficult at all    BP Readings from Last 3 Encounters:  04/20/20 106/72  03/13/20 110/78  03/05/20 112/76    Physical Exam Constitutional:      General: He is in acute distress.  HENT:     Right Ear: Drainage (purulent drainage with dried blood) and swelling present.     Ears:     Comments: Anterior to the ear canal is swollen, indurated, warm and tender External canal completely closed  Cardiovascular:     Rate and Rhythm: Normal rate and regular rhythm.  Pulmonary:     Effort: Pulmonary effort is normal.     Breath sounds: Normal breath sounds.  Lymphadenopathy:     Cervical: No cervical adenopathy.  Neurological:     Mental Status: He is alert.      Wt Readings from Last 3 Encounters:  04/20/20 298 lb (135.2 kg)  03/13/20 (!) 301 lb (136.5 kg)  03/05/20 (!) 305 lb (138.3 kg)    BP 106/72   Pulse (!) 101   Temp 98.4 F (36.9 C) (Oral)   Ht 5\' 11"  (1.803 m)   Wt 298 lb (135.2 kg)   SpO2 97%   BMI 41.56 kg/m   Assessment and Plan: 1. Facial abscess Warm compresses  several times a day Rinse gently in the shower - do not manipulate Follow up next week if not improving - amoxicillin-clavulanate (AUGMENTIN) 875-125 MG tablet; Take 1 tablet by mouth 2 (two) times daily for 10 days.  Dispense: 20 tablet; Refill: 0 - HYDROcodone-acetaminophen (NORCO/VICODIN) 5-325 MG tablet; Take 1 tablet by mouth every 6 (six) hours as needed for up to 5 days for moderate pain.  Dispense: 20 tablet; Refill: 0  2. Type II diabetes mellitus with complication ([place]) Currently without insurance coverage Samples of Janumet and Xarelto given   Partially dictated using Editor, commissioning. Any errors are unintentional.  Halina Maidens, MD [facility]  04/20/2020

## 2020-05-11 ENCOUNTER — Other Ambulatory Visit: Payer: Self-pay | Admitting: Internal Medicine

## 2020-05-11 DIAGNOSIS — E118 Type 2 diabetes mellitus with unspecified complications: Secondary | ICD-10-CM

## 2020-05-21 ENCOUNTER — Other Ambulatory Visit: Payer: Self-pay | Admitting: Internal Medicine

## 2020-05-21 DIAGNOSIS — E118 Type 2 diabetes mellitus with unspecified complications: Secondary | ICD-10-CM

## 2020-05-21 MED ORDER — OZEMPIC (1 MG/DOSE) 2 MG/1.5ML ~~LOC~~ SOPN: 1.0000 mg | PEN_INJECTOR | SUBCUTANEOUS | 1 refills | Status: DC

## 2020-05-21 NOTE — Telephone Encounter (Signed)
Medication Refill - Medication: Semaglutide, 1 MG/DOSE, (OZEMPIC, 1 MG/DOSE,) 2 MG/1.5ML SOPN     Preferred Pharmacy (with phone number or street name):  [id number] - [place], [place] Phone:  [phone]  Fax:  [phone]       Agent: Please be advised that RX refills may take up to 3 business days. We ask that you follow-up with your pharmacy.

## 2020-05-21 NOTE — Telephone Encounter (Signed)
Requested Prescriptions  Pending Prescriptions Disp Refills  . Semaglutide, 1 MG/DOSE, (OZEMPIC, 1 MG/DOSE,) 2 MG/1.5ML SOPN 4.5 mL 1    Sig: Inject 1 mg into the skin once a week.     Endocrinology:  Diabetes - GLP-1 Receptor Agonists Failed - 05/21/2020  1:36 PM      Failed - HBA1C is between 0 and 7.9 and within 180 days    Hemoglobin A1C  Date Value Ref Range Status  05/21/2011 7.2 (H) 4.2 - 6.3 % Final    Comment:    The American Diabetes Association recommends that a primary goal of therapy should be <7% and that physicians should reevaluate the treatment regimen in patients with HbA1c values consistently >8%.    Hgb A1c MFr Bld  Date Value Ref Range Status  03/05/2020 9.6 (H) 4.8 - 5.6 % Final    Comment:             Prediabetes: 5.7 - 6.4          Diabetes: >6.4          Glycemic control for adults with diabetes: <7.0          Passed - Valid encounter within last 6 months    Recent Outpatient Visits          1 month ago Facial abscess   [facility] Glean Hess, MD   2 months ago CKD stage G3a/A2, GFR 45-59 and albumin creatinine ratio 30-299 mg/g [facility])   [facility] Glean Hess, MD   2 months ago Type II diabetes mellitus with complication [facility]   [facility] Glean Hess, MD   3 months ago Dental infection   [facility] Glean Hess, MD   6 months ago Type II diabetes mellitus with complication [facility])   [facility] Glean Hess, MD      Future Appointments            In 1 month Army Melia, Jesse Sans, MD [facility], [facility]

## 2020-06-08 ENCOUNTER — Telehealth: Payer: Self-pay | Admitting: Internal Medicine

## 2020-06-08 NOTE — Telephone Encounter (Signed)
Patient would like the nurse to call him regarding his injection with Semaglutide, 1 MG/DOSE, (OZEMPIC, 1 MG/DOSE,) 2 MG/1.5ML SOPN.  He stated that he has a few questions before he takes the injection.  CB# [phone]

## 2020-06-08 NOTE — Telephone Encounter (Signed)
Called pt instructed pt on how many MG to take (1 MG). Pt verbalized understanding. Pt has refills at pharmacy.  KP

## 2020-06-15 ENCOUNTER — Other Ambulatory Visit: Payer: Self-pay | Admitting: Internal Medicine

## 2020-06-15 DIAGNOSIS — M1000 Idiopathic gout, unspecified site: Secondary | ICD-10-CM

## 2020-06-20 ENCOUNTER — Other Ambulatory Visit: Payer: Self-pay

## 2020-06-20 ENCOUNTER — Ambulatory Visit (INDEPENDENT_AMBULATORY_CARE_PROVIDER_SITE_OTHER): Payer: 59 | Admitting: Internal Medicine

## 2020-06-20 ENCOUNTER — Encounter: Payer: Self-pay | Admitting: Internal Medicine

## 2020-06-20 VITALS — BP 104/72 | HR 103 | Temp 98.0°F | Ht 71.0 in | Wt 297.0 lb

## 2020-06-20 DIAGNOSIS — E785 Hyperlipidemia, unspecified: Secondary | ICD-10-CM

## 2020-06-20 DIAGNOSIS — D6869 Other thrombophilia: Secondary | ICD-10-CM | POA: Insufficient documentation

## 2020-06-20 DIAGNOSIS — E1169 Type 2 diabetes mellitus with other specified complication: Secondary | ICD-10-CM | POA: Diagnosis not present

## 2020-06-20 DIAGNOSIS — I10 Essential (primary) hypertension: Secondary | ICD-10-CM

## 2020-06-20 DIAGNOSIS — N1831 Chronic kidney disease, stage 3a: Secondary | ICD-10-CM

## 2020-06-20 DIAGNOSIS — E118 Type 2 diabetes mellitus with unspecified complications: Secondary | ICD-10-CM | POA: Diagnosis not present

## 2020-06-20 DIAGNOSIS — I480 Paroxysmal atrial fibrillation: Secondary | ICD-10-CM

## 2020-06-20 MED ORDER — FREESTYLE LANCETS MISC: 12 refills | Status: AC

## 2020-06-20 MED ORDER — FREESTYLE LITE TEST VI STRP: ORAL_STRIP | 12 refills | Status: AC

## 2020-06-20 NOTE — Progress Notes (Signed)
Date:  06/20/2020   Name:  Jon Mendez   DOB:  03/23/1960   MRN:  [id number]   Chief Complaint: Diabetes (Last blood sugar 138 a few days ago )  Diabetes He presents for his follow-up diabetic visit. He has type 2 diabetes mellitus. Pertinent negatives for hypoglycemia include no headaches or tremors. Pertinent negatives for diabetes include no chest pain, no fatigue, no polydipsia and no polyuria. Symptoms are stable. Current diabetic treatment includes oral agent (dual therapy) (janumet and ozempic started last visit). He is compliant with treatment all of the time. He monitors blood glucose at home 1-2 x per week. His breakfast blood glucose is taken between 6-7 am. His breakfast blood glucose range is generally 130-140 mg/dl. An ACE inhibitor/angiotensin II receptor blocker is being taken. Eye exam is not current.  Hyperlipidemia This is a chronic problem. The problem is uncontrolled. Pertinent negatives include no chest pain or shortness of breath. Current antihyperlipidemic treatment includes statins. The current treatment provides moderate improvement of lipids.  Hypertension This is a chronic problem. The problem is resistant. Pertinent negatives include no chest pain, headaches, palpitations or shortness of breath. Past treatments include angiotensin blockers, diuretics and calcium channel blockers.  Renal insufficiency - declining GFR; stopped SGLT-2 last visit.  Referred to nephrology but not seen.  He says that he did not have insurance at the time of the appointment but he is willing to go.   Lab Results  Component Value Date   CREATININE 2.10 (H) 03/05/2020   BUN 24 03/05/2020   NA 138 03/05/2020   K 4.8 03/05/2020   CL 100 03/05/2020   CO2 20 03/05/2020   Lab Results  Component Value Date   CHOL 185 01/31/2019   HDL 45 01/31/2019   LDLCALC 108 (H) 01/31/2019   TRIG 186 (H) 01/31/2019   CHOLHDL 4.1 01/31/2019   Lab Results  Component Value Date   TSH 3.208  05/03/2018   Lab Results  Component Value Date   HGBA1C 9.6 (H) 03/05/2020   Lab Results  Component Value Date   WBC 12.7 (H) 01/31/2019   HGB 13.5 01/31/2019   HCT 40.9 01/31/2019   MCV 79 01/31/2019   PLT 263 01/31/2019   Lab Results  Component Value Date   ALT 15 10/28/2019   AST 15 10/28/2019   ALKPHOS 135 (H) 10/28/2019   BILITOT 0.3 10/28/2019     Review of Systems  Constitutional: Negative for appetite change, fatigue and unexpected weight change.  Eyes: Negative for visual disturbance.  Respiratory: Negative for cough, shortness of breath and wheezing.   Cardiovascular: Negative for chest pain, palpitations and leg swelling.  Gastrointestinal: Negative for abdominal pain and blood in stool.  Endocrine: Negative for polydipsia and polyuria.  Genitourinary: Negative for dysuria and hematuria.  Skin: Negative for color change and rash.  Neurological: Negative for tremors, numbness and headaches.  Psychiatric/Behavioral: Negative for dysphoric mood.    Patient Active Problem List   Diagnosis Date Noted  . Acquired thrombophilia ([place]) 06/20/2020  . Paroxysmal atrial fibrillation ([place]) 06/17/2019  . Bronchitis 05/03/2018  . Type II diabetes mellitus with complication ([place]) 21/30/8657  . Tubular adenoma of colon   . CKD stage G3a/A2, GFR 45-59 and albumin creatinine ratio 30-299 mg/g (HCC) 04/25/2015  . Chronic gouty arthritis 11/22/2014  . Hyperlipidemia associated with type 2 diabetes mellitus ([place]) 11/22/2014  . Essential (primary) hypertension 11/22/2014  . Compulsive tobacco user syndrome 11/22/2014  . Varicose veins of  both lower extremities 11/22/2014  . Hidradenitis suppurativa 09/06/2012  . Benign paroxysmal positional nystagmus 07/29/2004    Allergies  Allergen Reactions  . Eggs Or Egg-Derived Products Shortness Of Breath    Throat swells. Patient able to eat products that contain egg just not whole egg alone.  . Fish Allergy Shortness Of Breath     Throat swells    Past Surgical History:  Procedure Laterality Date  . CARDIOVERSION N/A 06/17/2018   Procedure: CARDIOVERSION (CATH LAB);  Surgeon: Minna Merritts, MD;  Location: [facility];  Service: Cardiovascular;  Laterality: N/A;  . COLONOSCOPY WITH PROPOFOL N/A 06/04/2015   Procedure: COLONOSCOPY WITH PROPOFOL;  Surgeon: Lucilla Lame, MD;  5 tubular adenomas  . HIP ARTHROPLASTY Left 2013  . LEG SKIN LESION  BIOPSY / EXCISION Right 2015   Hidradenitis  . POLYPECTOMY  06/04/2015   Procedure: POLYPECTOMY;  Surgeon: Lucilla Lame, MD;  Location: [place];  Service: Endoscopy;;  . VARICOSE VEIN SURGERY Bilateral 2015    Social History   Tobacco Use  . Smoking status: Current Every Day Smoker    Packs/day: 0.25    Years: 30.00    Pack years: 7.50    Types: Cigarettes  . Smokeless tobacco: Never Used  Substance Use Topics  . Alcohol use: No    Alcohol/week: 4.0 standard drinks    Types: 4 Standard drinks or equivalent per week  . Drug use: No     Medication list has been reviewed and updated.  Current Meds  Medication Sig  . acyclovir (ZOVIRAX) 400 MG tablet Take 400 mg by mouth 2 (two) times daily.  Marland Kitchen allopurinol (ZYLOPRIM) 100 MG tablet TAKE 1 TABLET BY MOUTH EVERY DAY  . amiodarone (PACERONE) 200 MG tablet Take 1 tablet (200 mg total) by mouth daily.  Marland Kitchen amLODipine (NORVASC) 5 MG tablet Take 1 tablet (5 mg total) by mouth daily.  Marland Kitchen diltiazem (CARDIZEM) 30 MG tablet Take 1 tablet (30 mg total) by mouth 2 (two) times daily.  Marland Kitchen glucose blood (FREESTYLE LITE) test strip Use as instructed  . losartan-hydrochlorothiazide (HYZAAR) 100-25 MG tablet Take 1 tablet by mouth daily.  . metoprolol succinate (TOPROL-XL) 100 MG 24 hr tablet Take 100 mg by mouth daily.  . Multiple Vitamin (MULTIVITAMIN WITH MINERALS) TABS tablet Take 1 tablet by mouth daily. Complete Multivitamin  . pravastatin (PRAVACHOL) 40 MG tablet Take 1 tablet (40 mg total) by mouth daily.  . rivaroxaban  (XARELTO) 20 MG TABS tablet Take 1 tablet (20 mg total) by mouth daily.  . Semaglutide, 1 MG/DOSE, (OZEMPIC, 1 MG/DOSE,) 2 MG/1.5ML SOPN Inject 1 mg into the skin once a week.  . sildenafil (VIAGRA) 100 MG tablet Take 0.5-1 tablets (50-100 mg total) by mouth daily as needed for erectile dysfunction.  . SitaGLIPtin-MetFORMIN HCl (JANUMET XR) [id number] MG TB24 Take 1 tablet by mouth daily.  Marland Kitchen triamcinolone ointment (KENALOG) 0.1 % Apply topically 2 (two) times daily.    PHQ 2/9 Scores 06/20/2020 04/20/2020 03/13/2020 03/05/2020  PHQ - 2 Score 0 0 0 0  PHQ- 9 Score 1 0 0 0    GAD 7 : Generalized Anxiety Score 06/20/2020 04/20/2020 03/13/2020 03/05/2020  Nervous, Anxious, on Edge 0 0 0 0  Control/stop worrying 0 0 0 0  Worry too much - different things 0 0 0 0  Trouble relaxing 0 0 0 0  Restless 0 0 0 0  Easily annoyed or irritable 0 0 0 0  Afraid - awful might happen  0 0 0 0  Total GAD 7 Score 0 0 0 0  Anxiety Difficulty - - - -    BP Readings from Last 3 Encounters:  06/20/20 104/72  04/20/20 106/72  03/13/20 110/78    Physical Exam Vitals and nursing note reviewed.  Constitutional:      General: He is not in acute distress.    Appearance: Normal appearance. He is well-developed.  HENT:     Head: Normocephalic and atraumatic.  Neck:     Thyroid: No thyroid tenderness.  Cardiovascular:     Rate and Rhythm: Normal rate and regular rhythm. Occasional extrasystoles are present.    Heart sounds: No murmur heard.   Pulmonary:     Effort: Pulmonary effort is normal. No respiratory distress.     Breath sounds: No wheezing or rhonchi.  Musculoskeletal:     Cervical back: Normal range of motion.     Right lower leg: No edema.  Lymphadenopathy:     Cervical: No cervical adenopathy.  Skin:    General: Skin is warm and dry.  Neurological:     Mental Status: He is alert and oriented to person, place, and time.  Psychiatric:        Attention and Perception: Attention normal.         Mood and Affect: Mood normal.        Behavior: Behavior normal.     Wt Readings from Last 3 Encounters:  06/20/20 297 lb (134.7 kg)  04/20/20 298 lb (135.2 kg)  03/13/20 (!) 301 lb (136.5 kg)    BP 104/72   Pulse (!) 103   Temp 98 F (36.7 C) (Oral)   Ht 5\' 11"  (1.803 m)   Wt 297 lb (134.7 kg)   SpO2 97%   BMI 41.42 kg/m   Assessment and Plan: 1. Type II diabetes mellitus with complication (HCC) Now on Ozempic and tolerating it well.  Has lost a few pounds. Continue Janumet as well.  Will check labs Pt reminded to schedule eye exam - Hemoglobin A1c - glucose blood (FREESTYLE LITE) test strip; Use as instructed  Dispense: 100 each; Refill: 12 - Lancets (FREESTYLE) lancets; Use as instructed  Dispense: 100 each; Refill: 12  2. Hyperlipidemia associated with type 2 diabetes mellitus (HCC) Continue pravastatin daily - Lipid panel  3. Essential (primary) hypertension Clinically stable exam with well controlled BP. Tolerating medications without side effects at this time. Pt to continue current regimen and low sodium diet; benefits of regular exercise as able discussed.  4. CKD stage G3a/A2, GFR 45-59 and albumin creatinine ratio 30-299 mg/g ([place]) Will refer again to Nephrology - Basic metabolic panel - Ambulatory referral to Nephrology  5. Paroxysmal atrial fibrillation (HCC) Intermittent per patient - he is on Xarelto but cost is an issue.  Samples provided today. Cardiology is trying to help with PA  6. Acquired thrombophilia ([place]) No bleeding issues noted per patient   Partially dictated using Editor, commissioning. Any errors are unintentional.  Halina Maidens, MD [facility]  06/20/2020

## 2020-06-21 LAB — BASIC METABOLIC PANEL
BUN/Creatinine Ratio: 8 — ABNORMAL LOW (ref 10–24)
BUN: 14 mg/dL (ref 8–27)
CO2: 20 mmol/L (ref 20–29)
Calcium: 10.1 mg/dL (ref 8.6–10.2)
Chloride: 95 mmol/L — ABNORMAL LOW (ref 96–106)
Creatinine, Ser: 1.82 mg/dL — ABNORMAL HIGH (ref 0.76–1.27)
Glucose: 212 mg/dL — ABNORMAL HIGH (ref 65–99)
Potassium: 4.4 mmol/L (ref 3.5–5.2)
Sodium: 132 mmol/L — ABNORMAL LOW (ref 134–144)
eGFR: 42 mL/min/{1.73_m2} — ABNORMAL LOW (ref >59)

## 2020-06-21 LAB — LIPID PANEL
Chol/HDL Ratio: 6.6 ratio — ABNORMAL HIGH (ref 0.0–5.0)
Cholesterol, Total: 218 mg/dL — ABNORMAL HIGH (ref 100–199)
HDL: 33 mg/dL — ABNORMAL LOW (ref >39)
LDL Chol Calc (NIH): 136 mg/dL — ABNORMAL HIGH (ref 0–99)
Triglycerides: 271 mg/dL — ABNORMAL HIGH (ref 0–149)
VLDL Cholesterol Cal: 49 mg/dL — ABNORMAL HIGH (ref 5–40)

## 2020-06-21 LAB — HEMOGLOBIN A1C
Est. average glucose Bld gHb Est-mCnc: 269 mg/dL
Hgb A1c MFr Bld: 11 % — ABNORMAL HIGH (ref 4.8–5.6)

## 2020-06-21 NOTE — Progress Notes (Signed)
DM is worse.  Cholesterol is higher than it was 3 months ago. Slightly improved kidney function. Continue current medication and work on low fat/low carb diet. Pt verbalized understanding.  KP

## 2020-06-28 ENCOUNTER — Other Ambulatory Visit: Payer: Self-pay | Admitting: Internal Medicine

## 2020-06-28 DIAGNOSIS — E785 Hyperlipidemia, unspecified: Secondary | ICD-10-CM

## 2020-07-10 ENCOUNTER — Other Ambulatory Visit: Payer: Self-pay | Admitting: Internal Medicine

## 2020-07-10 DIAGNOSIS — M1000 Idiopathic gout, unspecified site: Secondary | ICD-10-CM

## 2020-07-12 ENCOUNTER — Other Ambulatory Visit: Payer: Self-pay

## 2020-07-12 ENCOUNTER — Telehealth: Payer: Self-pay

## 2020-07-12 DIAGNOSIS — L732 Hidradenitis suppurativa: Secondary | ICD-10-CM

## 2020-07-12 NOTE — Telephone Encounter (Signed)
Called pt let him know that a RF was placed. Pt verbalized understanding.  KP

## 2020-07-12 NOTE — Telephone Encounter (Signed)
Copied from [place] [id number]. Topic: Referral - Question >> Jul 12, 2020  3:42 PM Virl Axe D wrote: Reason for CRM: Pt needs another referral for dermatology. Current dermatologist he is referred to is out of network and he has not had a good experience there. Requesting referral to -[facility] [address] [place] [phone]

## 2020-07-13 ENCOUNTER — Other Ambulatory Visit: Payer: Self-pay | Admitting: Internal Medicine

## 2020-07-13 DIAGNOSIS — L732 Hidradenitis suppurativa: Secondary | ICD-10-CM

## 2020-07-24 ENCOUNTER — Other Ambulatory Visit: Payer: Self-pay | Admitting: Internal Medicine

## 2020-07-24 DIAGNOSIS — M1000 Idiopathic gout, unspecified site: Secondary | ICD-10-CM

## 2020-07-24 NOTE — Telephone Encounter (Signed)
Requested medication (s) are due for refill today: yes  Requested medication (s) are on the active medication list: yes  Last refill:  07/10/20  Future visit scheduled: yes  Notes to clinic:  overdue lab work   Requested Prescriptions  Pending Prescriptions Disp Refills   allopurinol (ZYLOPRIM) 100 MG tablet [Pharmacy Med Name: ALLOPURINOL 100 MG TABLET] 90 tablet 1    Sig: TAKE [place]      Endocrinology:  Gout Agents Failed - 07/24/2020  9:35 AM      Failed - Uric Acid in normal range and within 360 days    No results found for: POCURA, LABURIC        Failed - Cr in normal range and within 360 days    Creatinine  Date Value Ref Range Status  05/09/2012 1.50 (H) 0.60 - 1.30 mg/dL Final   Creatinine, Ser  Date Value Ref Range Status  06/20/2020 1.82 (H) 0.76 - 1.27 mg/dL Final          Passed - Valid encounter within last 12 months    Recent Outpatient Visits           1 month ago Type II diabetes mellitus with complication [facility])   [facility] Glean Hess, MD   3 months ago Facial abscess   [facility] Glean Hess, MD   4 months ago CKD stage G3a/A2, GFR 45-59 and albumin creatinine ratio 30-299 mg/g [facility])   [facility] Glean Hess, MD   4 months ago Type II diabetes mellitus with complication [facility])   [facility] Glean Hess, MD   5 months ago Dental infection   [facility] Glean Hess, MD       Future Appointments             In 3 months Army Melia Jesse Sans, MD [facility], [facility]

## 2020-07-25 ENCOUNTER — Other Ambulatory Visit (HOSPITAL_COMMUNITY): Payer: Self-pay | Admitting: Nephrology

## 2020-07-25 ENCOUNTER — Other Ambulatory Visit: Payer: Self-pay | Admitting: Nephrology

## 2020-07-25 DIAGNOSIS — E1122 Type 2 diabetes mellitus with diabetic chronic kidney disease: Secondary | ICD-10-CM

## 2020-07-25 DIAGNOSIS — N1832 Chronic kidney disease, stage 3b: Secondary | ICD-10-CM

## 2020-07-25 DIAGNOSIS — R8290 Unspecified abnormal findings in urine: Secondary | ICD-10-CM

## 2020-07-25 DIAGNOSIS — R809 Proteinuria, unspecified: Secondary | ICD-10-CM

## 2020-07-25 DIAGNOSIS — E785 Hyperlipidemia, unspecified: Secondary | ICD-10-CM

## 2020-07-25 DIAGNOSIS — I10 Essential (primary) hypertension: Secondary | ICD-10-CM

## 2020-07-26 ENCOUNTER — Other Ambulatory Visit: Payer: Self-pay | Admitting: Internal Medicine

## 2020-07-26 DIAGNOSIS — I10 Essential (primary) hypertension: Secondary | ICD-10-CM

## 2020-08-15 ENCOUNTER — Other Ambulatory Visit: Payer: Self-pay

## 2020-08-15 ENCOUNTER — Ambulatory Visit
Admission: RE | Admit: 2020-08-15 | Discharge: 2020-08-15 | Disposition: A | Discharge status: Home / Self Care | Payer: 59 | Source: Ambulatory Visit | Attending: Nephrology | Admitting: Nephrology

## 2020-08-15 DIAGNOSIS — R809 Proteinuria, unspecified: Secondary | ICD-10-CM | POA: Insufficient documentation

## 2020-08-15 DIAGNOSIS — E785 Hyperlipidemia, unspecified: Secondary | ICD-10-CM | POA: Insufficient documentation

## 2020-08-15 DIAGNOSIS — I10 Essential (primary) hypertension: Secondary | ICD-10-CM | POA: Insufficient documentation

## 2020-08-15 DIAGNOSIS — R8290 Unspecified abnormal findings in urine: Secondary | ICD-10-CM | POA: Diagnosis present

## 2020-08-15 DIAGNOSIS — E1122 Type 2 diabetes mellitus with diabetic chronic kidney disease: Secondary | ICD-10-CM | POA: Diagnosis not present

## 2020-08-15 DIAGNOSIS — N1832 Chronic kidney disease, stage 3b: Secondary | ICD-10-CM | POA: Insufficient documentation

## 2020-08-19 ENCOUNTER — Other Ambulatory Visit: Payer: Self-pay | Admitting: Family Medicine

## 2020-08-19 DIAGNOSIS — I10 Essential (primary) hypertension: Secondary | ICD-10-CM

## 2020-08-19 NOTE — Telephone Encounter (Signed)
Requested Prescriptions  Pending Prescriptions Disp Refills  . amLODipine (NORVASC) 5 MG tablet [Pharmacy Med Name: AMLODIPINE BESYLATE 5 MG TAB] 30 tablet 0    Sig: TAKE 1 TABLET BY MOUTH EVERY DAY     Cardiovascular:  Calcium Channel Blockers Passed - 08/19/2020  1:32 PM      Passed - Last BP in normal range    BP Readings from Last 1 Encounters:  06/20/20 104/72         Passed - Valid encounter within last 6 months    Recent Outpatient Visits          2 months ago Type II diabetes mellitus with complication [facility])   [facility] Glean Hess, MD   4 months ago Facial abscess   [facility] Glean Hess, MD   5 months ago CKD stage G3a/A2, GFR 45-59 and albumin creatinine ratio 30-299 mg/g [facility])   [facility] Glean Hess, MD   5 months ago Type II diabetes mellitus with complication [facility])   [facility] Glean Hess, MD   6 months ago Dental infection   [facility] Glean Hess, MD      Future Appointments            In 2 months Army Melia Jesse Sans, MD [facility], [facility]

## 2020-10-01 LAB — HEMOGLOBIN A1C: Hemoglobin A1C: 8.2

## 2020-10-03 ENCOUNTER — Telehealth: Payer: Self-pay

## 2020-10-03 NOTE — Telephone Encounter (Signed)
Copied from [place] [id number]. Topic: Referral - Request for Referral >> Oct 03, 2020 11:52 AM Oneta Rack wrote: [place] Human name: Anderson Malta  Relation to pt: RN from [facility]  Call back number: [phone]    Reason for call: requesting verbal orders PT and OT

## 2020-10-03 NOTE — Telephone Encounter (Signed)
Called [place] and gave verbal orders for OT and PT.

## 2020-10-04 ENCOUNTER — Telehealth: Payer: Self-pay

## 2020-10-04 NOTE — Telephone Encounter (Signed)
Transition Care Management Unsuccessful Follow-up Telephone Call  Date of discharge and from where:  10/03/20 [facility]  Attempts:  1st Attempt  Reason for unsuccessful TCM follow-up call:  Left voice message. Pt may reach me directly at [phone]

## 2020-10-10 NOTE — Telephone Encounter (Signed)
Transition Care Management Unsuccessful Follow-up Telephone Call  Date of discharge and from where:  10/03/20 [facility]  Attempts:  2nd Attempt  Reason for unsuccessful TCM follow-up call:  Left voice message

## 2020-10-11 ENCOUNTER — Other Ambulatory Visit: Payer: Self-pay | Admitting: Internal Medicine

## 2020-10-11 DIAGNOSIS — M1000 Idiopathic gout, unspecified site: Secondary | ICD-10-CM

## 2020-10-15 ENCOUNTER — Ambulatory Visit: Payer: 59 | Admitting: Internal Medicine

## 2020-10-23 ENCOUNTER — Encounter: Payer: 59 | Admitting: Internal Medicine

## 2020-10-24 ENCOUNTER — Ambulatory Visit
Admission: RE | Admit: 2020-10-24 | Discharge: 2020-10-24 | Disposition: A | Discharge status: Home / Self Care | Payer: 59 | Source: Ambulatory Visit | Attending: Internal Medicine | Admitting: Internal Medicine

## 2020-10-24 ENCOUNTER — Encounter: Payer: Self-pay | Admitting: Internal Medicine

## 2020-10-24 ENCOUNTER — Other Ambulatory Visit: Payer: Self-pay

## 2020-10-24 ENCOUNTER — Ambulatory Visit
Admission: RE | Admit: 2020-10-24 | Discharge: 2020-10-24 | Disposition: A | Discharge status: Home / Self Care | Payer: 59 | Attending: Internal Medicine | Admitting: Internal Medicine

## 2020-10-24 ENCOUNTER — Ambulatory Visit (INDEPENDENT_AMBULATORY_CARE_PROVIDER_SITE_OTHER): Payer: 59 | Admitting: Internal Medicine

## 2020-10-24 VITALS — BP 86/58 | HR 92 | Ht 71.0 in | Wt 265.0 lb

## 2020-10-24 DIAGNOSIS — G8929 Other chronic pain: Secondary | ICD-10-CM

## 2020-10-24 DIAGNOSIS — R7612 Nonspecific reaction to cell mediated immunity measurement of gamma interferon antigen response without active tuberculosis: Secondary | ICD-10-CM

## 2020-10-24 DIAGNOSIS — L732 Hidradenitis suppurativa: Secondary | ICD-10-CM

## 2020-10-24 DIAGNOSIS — M25551 Pain in right hip: Secondary | ICD-10-CM

## 2020-10-24 DIAGNOSIS — I10 Essential (primary) hypertension: Secondary | ICD-10-CM

## 2020-10-24 MED ORDER — LOSARTAN POTASSIUM-HCTZ 100-25 MG PO TABS: 1.0000 | ORAL_TABLET | Freq: Every day | ORAL | 1 refills | Status: DC

## 2020-10-24 MED ORDER — TRAMADOL HCL 50 MG PO TABS: 50.0000 mg | ORAL_TABLET | Freq: Three times a day (TID) | ORAL | 0 refills | Status: AC | PRN

## 2020-10-24 NOTE — Progress Notes (Signed)
Date:  10/24/2020   Name:  Jon Mendez   DOB:  03/23/1960   MRN:  [id number]   Chief Complaint: Hypertension Hospital follow up.  [facility] 09/30/20.  Admission Diagnoses:  Hidradenitis [L73.2]  Discharge Diagnoses:  Active Problems: Hidradenitis Chronic renal insufficiency, stage III (moderate) (CMS-HCC) Controlled type 2 diabetes mellitus with stage 3 chronic kidney disease, without long-term current use of insulin (CMS-HCC) Paroxysmal atrial fibrillation (CMS-HCC) Resolved Problems: * No resolved hospital problems. * Primary Diagnosis: hidradenitis suppurativa flare affecting the pubic and buttocks.   Changes Made: modifications to patient's HTN medication regimen; Cardiazem was decreased to 60 mg daily from 120 mg daily.  Anticipatory Guidance for Outpatient Provider: PCP to provide additional guidance re: HTN med regimen.   Per our dermatology colleagues, outpatient plan is to start pt on oral doxycycline 100mg  BID for 3 months outpatient as bridge until anti-TNF therapy is restarted, along with daily chlorhexidine wash, clindamycin lotion and pain control with Tylenol.  --Start oral doxycycline 100mg  BID, continue for 3 months. --For pain control: PRN Tylenol 650mg  q6h  --f/u with [facility] on 7/8. --[facility] appt w/ Dr. Theodoro Clock on 03/08/21.  Quantiferon TB Gold PLUS Order: [id number]  Ref Range & Units 3 wk ago  NIL IU/mL 0.03   TB 1 Antigen - NIL IU/mL 0.34   TB 2 Antigen - NIL IU/mL 0.36   Mitogen - NIL IU/mL 4.87   Quantiferon TB Negative Positive Abnormal    Hypertension This is a chronic problem. The problem is controlled. Pertinent negatives include no chest pain, headaches or shortness of breath. Past treatments include calcium channel blockers and beta blockers (amlodipine and losartan hct stopped at discharge - however he did not stop these). There are no compliance problems.  Hypertensive end-organ damage includes kidney disease. There is no history  of CAD/MI or CVA.  Diabetes He presents for his follow-up diabetic visit. He has type 2 diabetes mellitus. His disease course has been improving. Pertinent negatives for hypoglycemia include no dizziness, headaches or nervousness/anxiousness. Associated symptoms include fatigue. Pertinent negatives for diabetes include no chest pain. Pertinent negatives for diabetic complications include no CVA.  Hip Pain  There was no injury mechanism. The pain is present in the right hip. The quality of the pain is described as aching and cramping. The pain is moderate. The pain has been Fluctuating since onset. Associated symptoms include an inability to bear weight. The symptoms are aggravated by weight bearing.  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  He is referred to the [facility].  Review of records show a normal CXR in 2020. HS - much improved but still draining.  Plan to be on doxycycline for up to 3 months.  Can not start immunosuppressants until cleared or treated by the HD for TB.   Lab Results  Component Value Date   CREATININE 1.82 (H) 06/20/2020   BUN 14 06/20/2020   NA 132 (L) 06/20/2020   K 4.4 06/20/2020   CL 95 (L) 06/20/2020   CO2 20 06/20/2020   Lab Results  Component Value Date   CHOL 218 (H) 06/20/2020   HDL 33 (L) 06/20/2020   LDLCALC 136 (H) 06/20/2020   TRIG 271 (H) 06/20/2020   CHOLHDL 6.6 (H) 06/20/2020   Lab Results  Component Value Date   TSH 3.208 05/03/2018   Lab Results  Component Value Date   HGBA1C 8.2 10/01/2020  Lab Results  Component Value Date   WBC 12.7 (H) 01/31/2019   HGB 13.5 01/31/2019   HCT 40.9 01/31/2019   MCV 79 01/31/2019   PLT 263 01/31/2019   Lab Results  Component Value Date   ALT 15 10/28/2019   AST 15 10/28/2019   ALKPHOS 135 (H) 10/28/2019   BILITOT 0.3 10/28/2019     Review of Systems  Constitutional:  Positive for fatigue and unexpected weight change. Negative for chills and  fever.  Respiratory:  Negative for cough, chest tightness and shortness of breath.   Cardiovascular:  Negative for chest pain and leg swelling.  Gastrointestinal:  Negative for abdominal pain, constipation and diarrhea.  Musculoskeletal:  Positive for arthralgias and gait problem.  Skin:  Positive for wound.  Neurological:  Negative for dizziness, light-headedness and headaches.  Psychiatric/Behavioral:  Negative for dysphoric mood and sleep disturbance. The patient is not nervous/anxious.    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   Acquired thrombophilia ([place]) 06/20/2020   Paroxysmal atrial fibrillation ([place]) 06/17/2019   Bronchitis 05/03/2018   Type II diabetes mellitus with complication ([place]) 51/88/4166   Tubular adenoma of colon    CKD stage G3a/A2, GFR 45-59 and albumin creatinine ratio 30-299 mg/g (HCC) 04/25/2015   Chronic gouty arthritis 11/22/2014   Hyperlipidemia associated with type 2 diabetes mellitus ([place]) 11/22/2014   Essential (primary) hypertension 11/22/2014   Compulsive tobacco user syndrome 11/22/2014   Varicose veins of both lower extremities 11/22/2014   Hidradenitis suppurativa 09/06/2012   Benign paroxysmal positional nystagmus 07/29/2004    Allergies  Allergen Reactions   Eggs Or Egg-Derived Products Shortness Of Breath    Throat swells. Patient able to eat products that contain egg just not whole egg alone.   Fish Allergy Shortness Of Breath    Throat swells    Past Surgical History:  Procedure Laterality Date   CARDIOVERSION N/A 06/17/2018   Procedure: CARDIOVERSION (CATH LAB);  Surgeon: Minna Merritts, MD;  Location: [facility];  Service: Cardiovascular;  Laterality: N/A;   COLONOSCOPY WITH PROPOFOL N/A 06/04/2015   Procedure: COLONOSCOPY WITH PROPOFOL;  Surgeon: Lucilla Lame, MD;  5 tubular adenomas   HIP ARTHROPLASTY Left 2013   LEG SKIN LESION  BIOPSY / EXCISION Right 2015   Hidradenitis    POLYPECTOMY  06/04/2015   Procedure: POLYPECTOMY;  Surgeon: Lucilla Lame, MD;  Location: [place];  Service: Endoscopy;;   VARICOSE VEIN SURGERY Bilateral 2015    Social History   Tobacco Use   Smoking status: Every Day    Packs/day: 0.25    Years: 30.00    Pack years: 7.50    Types: Cigarettes   Smokeless tobacco: Never  Substance Use Topics   Alcohol use: No    Alcohol/week: 4.0 standard drinks    Types: 4 Standard drinks or equivalent per week   Drug use: No     Medication list has been reviewed and updated.  Current Meds  Medication Sig   acyclovir (ZOVIRAX) 400 MG tablet Take 400 mg by mouth 2 (two) times daily.   allopurinol (ZYLOPRIM) 100 MG tablet TAKE 1 TABLET BY MOUTH EVERY DAY   amiodarone (PACERONE) 200 MG tablet Take 1 tablet (200 mg total) by mouth daily.   glucose blood (FREESTYLE LITE) test strip Use as instructed   Lancets (FREESTYLE) lancets Use as instructed   metoprolol succinate (TOPROL-XL) 100 MG 24 hr tablet Take 100 mg by mouth  daily.   Multiple Vitamin (MULTIVITAMIN WITH MINERALS) TABS tablet Take 1 tablet by mouth daily. Complete Multivitamin   pravastatin (PRAVACHOL) 40 MG tablet TAKE 1 TABLET BY MOUTH EVERY DAY   rivaroxaban (XARELTO) 20 MG TABS tablet Take 1 tablet (20 mg total) by mouth daily.   Semaglutide, 1 MG/DOSE, (OZEMPIC, 1 MG/DOSE,) 2 MG/1.5ML SOPN Inject 1 mg into the skin once a week.   sildenafil (VIAGRA) 100 MG tablet Take 0.5-1 tablets (50-100 mg total) by mouth daily as needed for erectile dysfunction.   SitaGLIPtin-MetFORMIN HCl (JANUMET XR) [phone] MG TB24 Take 1 tablet by mouth daily.   triamcinolone ointment (KENALOG) 0.1 % Apply topically 2 (two) times daily.   [DISCONTINUED] diltiazem (CARDIZEM) 120 MG tablet Take 60 mg by mouth daily.    PHQ 2/9 Scores 06/20/2020 04/20/2020 03/13/2020 03/05/2020  PHQ - 2 Score 0 0 0 0  PHQ- 9 Score 1 0 0 0    GAD 7 : Generalized Anxiety Score 06/20/2020 04/20/2020 03/13/2020  03/05/2020  Nervous, Anxious, on Edge 0 0 0 0  Control/stop worrying 0 0 0 0  Worry too much - different things 0 0 0 0  Trouble relaxing 0 0 0 0  Restless 0 0 0 0  Easily annoyed or irritable 0 0 0 0  Afraid - awful might happen 0 0 0 0  Total GAD 7 Score 0 0 0 0  Anxiety Difficulty - - - -    BP Readings from Last 3 Encounters:  10/24/20 (!) 86/58  06/20/20 104/72  04/20/20 106/72    Physical Exam Vitals and nursing note reviewed.  Constitutional:      General: He is not in acute distress.    Appearance: Normal appearance. He is well-developed.  HENT:     Head: Normocephalic and atraumatic.  Neck:     Vascular: No carotid bruit.  Cardiovascular:     Rate and Rhythm: Normal rate and regular rhythm.     Pulses: Normal pulses.     Heart sounds: No murmur heard. Pulmonary:     Effort: Pulmonary effort is normal. No respiratory distress.     Breath sounds: No wheezing or rhonchi.  Musculoskeletal:     Cervical back: Normal range of motion.     Right lower leg: No edema.     Left lower leg: No edema.  Lymphadenopathy:     Cervical: No cervical adenopathy.  Skin:    General: Skin is warm.     Comments: Skin exam not done  Neurological:     General: No focal deficit present.     Mental Status: He is alert and oriented to person, place, and time.  Psychiatric:        Mood and Affect: Mood normal.        Behavior: Behavior normal.    Wt Readings from Last 3 Encounters:  10/24/20 265 lb (120.2 kg)  06/20/20 297 lb (134.7 kg)  04/20/20 298 lb (135.2 kg)    BP (!) 86/58 (BP Location: Left Arm, Patient Position: Sitting, Cuff Size: Large)   Pulse 92   Ht 5\' 11"  (1.803 m)   Wt 265 lb (120.2 kg)   SpO2 98%   BMI 36.96 kg/m   Assessment and Plan: 1. Essential (primary) hypertension BP is running low - he never stopped the meds as instructed by [facility] Stop amlodipine and diltiazem Continue metoprolol and losartan-hct - losartan-hydrochlorothiazide (HYZAAR) 100-25 MG  tablet; Take 1 tablet by mouth daily.  Dispense: 90 tablet;  Refill: 1  2. Hidradenitis suppurativa Continue Doxycycline Follow up with Dermatology at [facility]  3. Positive QuantiFERON-TB Gold test Seeing the [facility] in the near future  4. Hip pain, chronic, right S/p left THA now limited significantly in ADLs. Will get hip films - may need to see Ortho Can take Tramadol at HS only to help with sleep but this is a limited Rx - DG HIP UNILAT WITH PELVIS 2-3 VIEWS RIGHT; Future - traMADol (ULTRAM) 50 MG tablet; Take 1 tablet (50 mg total) by mouth every 8 (eight) hours as needed for up to 5 days.  Dispense: 15 tablet; Refill: 0   Partially dictated using Editor, commissioning. Any errors are unintentional.  Halina Maidens, MD [facility]  10/24/2020

## 2020-10-24 NOTE — Patient Instructions (Signed)
Stop Amlodipine and Diltiazem/cardizem  Continue Losartan/hct and Metoprolol

## 2020-10-26 ENCOUNTER — Other Ambulatory Visit: Payer: Self-pay

## 2020-10-26 DIAGNOSIS — M8700 Idiopathic aseptic necrosis of unspecified bone: Secondary | ICD-10-CM

## 2020-10-26 NOTE — Progress Notes (Signed)
[facility] ref placed

## 2020-10-31 ENCOUNTER — Encounter: Payer: 59 | Admitting: Internal Medicine

## 2020-11-13 ENCOUNTER — Telehealth: Payer: Self-pay | Admitting: Internal Medicine

## 2020-11-13 NOTE — Telephone Encounter (Signed)
Pt called in for assistance. Pt is requesting a cal back to discuss further. Pt says that he need a hip replacement and is in pain. Pt says when he needed a hip replacement on his other hip the provider prescribed him Cortisone pills. Pt would like to know if possible, could provider assist him with the pain?     Pharmacy:  [id number] - [place], [place] Phone:  [phone]  Fax:  [phone]      Please assist pt further.

## 2020-11-14 ENCOUNTER — Telehealth: Payer: Self-pay

## 2020-11-14 ENCOUNTER — Other Ambulatory Visit: Payer: Self-pay

## 2020-11-14 NOTE — Telephone Encounter (Signed)
Spoke to pt this morning.  Forwarded message to Dr. Army Melia.  KP

## 2020-11-14 NOTE — Telephone Encounter (Signed)
Copied from [place] [id number]. Topic: General - Other >> Nov 14, 2020  8:05 AM Leward Quan A wrote: Reason for CRM: Patient wife Helene Kelp called in to inform Dr Army Melia that patient is still in severe pain and is having a gout flare up, would like something for the gout and pain. Say that from the referral he will be seen on 12/05/20 but for now need something stronger for the pain. Per Helene Kelp he is also having trouble sleeping because of the pain. Would like a call back from Dr Army Melia or nurse  Ph# [phone] or [phone]

## 2020-11-14 NOTE — Telephone Encounter (Signed)
Pt wants a refill on tramadol. Pt said he is in a lot of pain and can hardly walk. Pt does not have an appt schedule with ortho until 12/05/2020. Told pt her could go to ED to possibly get a stronger medication for his pain. Pt stated that he will not be able to make it there. Pt first stated tramadol did not help but will continue to take the medication because its for pain.  KP

## 2020-11-15 ENCOUNTER — Other Ambulatory Visit: Payer: Self-pay | Admitting: Internal Medicine

## 2020-11-15 MED ORDER — TRAMADOL HCL 50 MG PO TABS: 50.0000 mg | ORAL_TABLET | Freq: Three times a day (TID) | ORAL | 0 refills | Status: AC | PRN

## 2020-11-15 NOTE — Telephone Encounter (Signed)
Called pt let him know that Dr. Army Melia sent him in Tramadol to try to take it with tylenol to see if that helps. Pt verbalized understanding.  KP

## 2020-11-26 ENCOUNTER — Telehealth: Payer: Self-pay

## 2020-11-26 NOTE — Telephone Encounter (Signed)
Transition Care Management Follow-up Telephone Call Date of discharge and from where: 11/25/20.  How have you been since you were released from the hospital? Pt states he is feeling better Any questions or concerns? No  Items Reviewed: Did the pt receive and understand the discharge instructions provided? Yes  Medications obtained and verified? Yes  Other? No  Any new allergies since your discharge? No  Dietary orders reviewed? Yes Do you have support at home? Yes   Home Care and Equipment/Supplies: Were home health services ordered? yes If so, what is the name of the agency? [facility] Has the agency set up a time to come to the patient's home? Yes - PT scheduled for 11/30/20 Were any new equipment or medical supplies ordered?  No  Functional Questionnaire: (I = Independent and D = Dependent) ADLs: I  Bathing/Dressing- I  Meal Prep- I  Eating- I  Maintaining continence- I  Transferring/Ambulation- I  Managing Meds- D - wife manages medications  Follow up appointments reviewed:  PCP Hospital f/u appt confirmed? Yes  Scheduled to see Dr. Army Melia on 12/04/20 @ 1:20. [facility] f/u appt confirmed? Yes  Scheduled to see [facility] on 12/03/20 @ 8:30. Pt also has follow up scheduled with orthopaedics, dermatology and rheumatology Are transportation arrangements needed? No  If their condition worsens, is the pt aware to call PCP or go to the Emergency Dept.? Yes Was the patient provided with contact information for the PCP's office or ED? Yes Was to pt encouraged to call back with questions or concerns? Yes

## 2020-11-30 ENCOUNTER — Other Ambulatory Visit: Payer: Self-pay | Admitting: Internal Medicine

## 2020-11-30 DIAGNOSIS — E118 Type 2 diabetes mellitus with unspecified complications: Secondary | ICD-10-CM

## 2020-12-04 ENCOUNTER — Encounter: Payer: Self-pay | Admitting: Internal Medicine

## 2020-12-04 ENCOUNTER — Other Ambulatory Visit: Payer: Self-pay

## 2020-12-04 ENCOUNTER — Ambulatory Visit (INDEPENDENT_AMBULATORY_CARE_PROVIDER_SITE_OTHER): Payer: Medicare Other | Admitting: Internal Medicine

## 2020-12-04 VITALS — BP 90/62 | HR 141 | Temp 97.8°F | Ht 71.0 in | Wt 253.0 lb

## 2020-12-04 DIAGNOSIS — Z227 Latent tuberculosis: Secondary | ICD-10-CM

## 2020-12-04 DIAGNOSIS — M05772 Rheumatoid arthritis with rheumatoid factor of left ankle and foot without organ or systems involvement: Secondary | ICD-10-CM | POA: Diagnosis not present

## 2020-12-04 DIAGNOSIS — I10 Essential (primary) hypertension: Secondary | ICD-10-CM | POA: Diagnosis not present

## 2020-12-04 DIAGNOSIS — I480 Paroxysmal atrial fibrillation: Secondary | ICD-10-CM | POA: Diagnosis not present

## 2020-12-04 DIAGNOSIS — M109 Gout, unspecified: Secondary | ICD-10-CM | POA: Diagnosis not present

## 2020-12-04 NOTE — Patient Instructions (Signed)
Resume diltiazem until you see Dr. Clayborn Bigness.

## 2020-12-04 NOTE — Progress Notes (Signed)
Date:  12/04/2020   Name:  Jon Mendez   DOB:  03/23/1960   MRN:  [id number]   Chief Complaint: Hypertension Hospital follow up.  Admitted to [facility] 11/18/20 to 11/25/20.   A TOC call was done on 11/26/20.  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 * Primary Diagnosis: Admitted for subacute polyarticular joint pain and papulopustular skin eruption  Changes Made:  Broad infectious and autoimmune workup sent. RF and CCP elevated, started on HCQ by rheumatology for RA. Evaluated by dermatology who felt rash most consistent with zoster, though PCR negative x 2. Treated with IV acyclovir, transitioned to oral prior to discharge to complete 14-day course. Monkeypox felt less likely but serology sent, result pending at discharge. Previous positive quantiferon gold noted, CXR without evidence of active disease. Discussed with ID and [facility], started on isoniazid/B6 for 21-month course. Experienced atrial flutter during admission, resolved following cardioversion. Will be  discharged on amiodarone with discontinuation of home metoprolol and diltiazem. Evaluated by PT/OT who recommended SNF, however declined and was discharged home with [facility] PT/OT.  Anticipatory Guidance for Outpatient Provider:  -Will follow up with [place] 7/93 for continued INH therapy -Will continue amiodarone for atrial flutter without metoprolol or diltiazem -Can start humira for HS two weeks after initiation of isoniazid (~9/3) -Will need shingrix in one year (11/2021)  Recommended Follow-up Studies:  -monthly LFTs while on isoniazid -repeat CBC to ensure resolving leukocytosis -repeat TTE as outpatient to follow up cardiac function in normal sinus rhythm (slightly depressed while in flutter)   Results Pending at Discharge:  Monkeypox lab titer - pending - sample lost so unable to test Free light chains - mildly elevated Please see phone numbers at end of this summary for lab contact information.   Follow-up/Care Transition Plan: Future Appointments  Date Time Provider [place]  12/05/2020 1:00 PM Burton Apley, [place] [facility]  01/01/2021 11:00 AM Sherie Don, MD [facility]  01/14/2021 2:45 PM Leisenring, Daine Floras, MD DDSDS [facility]  03/08/2021 8:30 AM Nida Boatman, MD [facility]  03/21/2021 3:30 PM Molli Posey, MD DS [facility]   [place] Provider Follow-up: [facility] 9/03   Allergies/Intolerances:  Allergies  Allergen Reactions   Egg Anaphylaxis   Fish Containing Products Swelling    New Adverse Drug Events: none  Medications:  Current Discharge Medication List   START taking these medications  Details  acetaminophen (TYLENOL) 325 MG tablet Take 3 tablets (975 mg total) by mouth every 6 (six) hours as needed (pain) for up to 10 days Qty: 30 tablet, Refills: 0  hydrOXYchloroQUINE (PLAQUENIL) 200 mg tablet Take 1 tablet (200 mg total)  by mouth once daily Qty: 90 tablet, Refills: 1  Associated Diagnoses:  Polyarticular arthritis; 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 (CMS-HCC)  isoniazid (NYDRAZID) 300 MG tablet Take 1 tablet (300 mg total) by mouth once daily for 30 days Qty: 30 tablet, Refills: 0  oxyCODONE (ROXICODONE) 5 MG immediate release tablet Take 1 tablet (5 mg total) by mouth every 6 (six) hours as needed (pain not responsive to tylenol) for up to 5 days Qty: 20 tablet, Refills: 0  pyridoxine, vitamin B6, (VITAMIN B-6) 25 MG tablet Take 1 tablet (25 mg total) by mouth once daily Qty: 30 tablet, Refills: 0  valACYclovir (VALTREX) 1000 MG tablet Take 1 tablet (1,000 mg total) by mouth every 8 (eight) hours for 7 days Qty: 21 tablet, Refills: 0 (completed)   CONTINUE these medications which have CHANGED  Details  AMIOdarone (PACERONE) 200 MG tablet Take 1 tablet (200 mg total) by mouth once daily Qty: 90 tablet, Refills: 1    CONTINUE these medications which have NOT CHANGED  Details  allopurinol (ZYLOPRIM) 100 MG tablet Take 1 tablet by mouth once daily.  Associated Diagnoses: Ulcer of lower limb, right, with fat layer exposed (CMS-HCC); Type II or unspecified type diabetes mellitus without mention of complication, not stated as uncontrolled (CMS-HCC)  clindamycin (CLEOCIN T) 1 % lotion Apply topically 2 (two) times daily Qty: 60 mL, Refills: 1  doxycycline monohydrate 100 mg Tab tablet Take 1 tablet (100 mg total) by mouth every 12 (twelve) hours for 90 days Qty: 180 tablet, Refills: 0  JANUMET XR 100-1,000 mg TM24 Take 1 tablet by mouth daily.  OZEMPIC 1 mg/dose (4 mg/3 mL) PnIj Inject 1 mg subcutaneously every Wednesday  pravastatin (PRAVACHOL) 40 MG tablet Take 1 tablet (40 mg total) by mouth once daily Qty: 90 tablet, Refills: 3  rivaroxaban (XARELTO) 20 mg tablet Take 1 tablet (20 mg total) by mouth daily with breakfast Qty: 90 tablet, Refills: 5  triamcinolone 0.1 %  ointment Apply 1 Application topically once daily as needed    STOP taking these medications  acyclovir (ZOVIRAX) 400 MG tablet  diltiazem (CARDIZEM) 120 MG tablet  metoprolol succinate (TOPROL-XL) 100 MG XL tablet   Hypertension This is a chronic problem. The problem is controlled. Associated symptoms include palpitations and shortness of breath (with exertion). Pertinent negatives include no chest pain or headaches. Past treatments include angiotensin blockers and diuretics. There are no compliance problems.  Hypertensive end-organ damage includes kidney disease.  Atrial fibrillation -  he had rapid afib while in the hospital.  Converted to SR on Amiodarone.  Metoprolol and Diltiazem were stopped.  However, 2 days after discharge he returned to Afib.  He has taken metoprolol and diltiazem a few times to help control the rate.  He sees cardiology in 2 days.  RA - tested positive and was started on Plaquenil.  It was thought that his left foot pain was due to RA plus gout since Uric acid was elevated.  He is still on Allopurinol as well.  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  GOUT - on allopurinol and his foot is significantly improved.   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  CXR showed no active TB.  He will take this for 6 months.  Hip pain - ON on xray.  Seeing ortho in the near future.  Unclear how much of the hip pain is also RA.  Lab Results  Component Value Date  CREATININE 1.82 (H) 06/20/2020   BUN 14 06/20/2020   NA 132 (L) 06/20/2020   K 4.4 06/20/2020   CL 95 (L) 06/20/2020   CO2 20 06/20/2020   Lab Results  Component Value Date   CHOL 218 (H) 06/20/2020   HDL 33 (L) 06/20/2020   LDLCALC 136 (H) 06/20/2020   TRIG 271 (H) 06/20/2020   CHOLHDL 6.6 (H) 06/20/2020   Lab Results  Component Value Date   TSH 3.208 05/03/2018   Lab Results  Component Value Date   HGBA1C 8.2  10/01/2020   Lab Results  Component Value Date   WBC 12.7 (H) 01/31/2019   HGB 13.5 01/31/2019   HCT 40.9 01/31/2019   MCV 79 01/31/2019   PLT 263 01/31/2019   Lab Results  Component Value Date   ALT 15 10/28/2019   AST 15 10/28/2019   ALKPHOS 135 (H) 10/28/2019   BILITOT 0.3 10/28/2019     Review of Systems  Constitutional:  Positive for unexpected weight change (continues to lose weight). Negative for chills, fatigue and fever.  HENT:  Negative for trouble swallowing.   Respiratory:  Positive for shortness of breath (with exertion). Negative for chest tightness.   Cardiovascular:  Positive for palpitations. Negative for chest pain and leg swelling.  Gastrointestinal:  Negative for constipation and diarrhea.  Genitourinary:  Negative for dysuria.  Musculoskeletal:  Positive for arthralgias, gait problem and joint swelling.  Skin:  Positive for rash (mostly resolved).  Neurological:  Negative for dizziness and headaches.  Psychiatric/Behavioral:  Negative for dysphoric mood and sleep disturbance. The patient is not nervous/anxious.    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   Hip pain, chronic, right 10/24/2020   Acquired thrombophilia ([place]) 06/20/2020   Paroxysmal atrial fibrillation ([place]) 06/17/2019   Bronchitis 05/03/2018   Type II diabetes mellitus with complication ([place]) 78/24/2353   Tubular adenoma of colon    CKD stage G3a/A2, GFR 45-59 and albumin creatinine ratio 30-299 mg/g (HCC) 04/25/2015   Chronic gouty arthritis 11/22/2014   Hyperlipidemia associated with type 2 diabetes mellitus ([place]) 11/22/2014   Essential (primary) hypertension 11/22/2014   Compulsive tobacco user syndrome 11/22/2014   Varicose veins of both lower extremities 11/22/2014   Hidradenitis suppurativa 09/06/2012   Benign paroxysmal positional nystagmus 07/29/2004    Allergies  Allergen Reactions   Eggs Or Egg-Derived Products  Shortness Of Breath    Throat swells. Patient able to eat products that contain egg just not whole egg alone.   Fish Allergy Shortness Of Breath    Throat swells    Past Surgical History:  Procedure Laterality Date   CARDIOVERSION N/A 06/17/2018   Procedure: CARDIOVERSION (CATH LAB);  Surgeon: Minna Merritts, MD;  Location: [facility] ORS;  Service: Cardiovascular;  Laterality: N/A;   COLONOSCOPY WITH PROPOFOL N/A 06/04/2015   Procedure: COLONOSCOPY WITH PROPOFOL;  Surgeon: Lucilla Lame, MD;  5 tubular adenomas   HIP ARTHROPLASTY Left 2013   LEG SKIN LESION  BIOPSY / EXCISION Right 2015   Hidradenitis   POLYPECTOMY  06/04/2015   Procedure: POLYPECTOMY;  Surgeon: Lucilla Lame, MD;  Location: [place];  Service: Endoscopy;;   VARICOSE VEIN SURGERY Bilateral 2015    Social History   Tobacco Use   Smoking status: Every Day    Packs/day: 0.25    Years: 30.00    Pack years: 7.50    Types: Cigarettes   Smokeless tobacco: Never  Substance  Use Topics   Alcohol use: No    Alcohol/week: 4.0 standard drinks    Types: 4 Standard drinks or equivalent per week   Drug use: No     Medication list has been reviewed and updated.  Current Meds  Medication Sig   acetaminophen (TYLENOL) 325 MG tablet Take by mouth. Take 3 tablets (975 mg total) by mouth every 6 (six) hours as needed (pain) for up to 10 days   acyclovir (ZOVIRAX) 400 MG tablet Take 400 mg by mouth 2 (two) times daily.   allopurinol (ZYLOPRIM) 100 MG tablet TAKE 1 TABLET BY MOUTH EVERY DAY   amiodarone (PACERONE) 200 MG tablet Take by mouth.   clindamycin (CLEOCIN T) 1 % lotion Apply topically 2 (two) times daily.   doxycycline (ADOXA) 100 MG tablet SMARTSIG:1 Tablet(s) By Mouth Every 12 Hours   glucose blood (FREESTYLE LITE) test strip Use as instructed   hydroxychloroquine (PLAQUENIL) 200 MG tablet Take 200 mg by mouth daily.   isoniazid (NYDRAZID) 300 MG tablet Take 300 mg by mouth daily.   Lancets (FREESTYLE)  lancets Use as instructed   losartan-hydrochlorothiazide (HYZAAR) 100-25 MG tablet Take 1 tablet by mouth daily.   Multiple Vitamin (MULTIVITAMIN WITH MINERALS) TABS tablet Take 1 tablet by mouth daily. Complete Multivitamin   oxyCODONE (OXY IR/ROXICODONE) 5 MG immediate release tablet Take 5 mg by mouth 4 (four) times daily as needed.   OZEMPIC, 1 MG/DOSE, 4 MG/3ML SOPN INJECT 1 MG INTO THE SKIN ONCE A WEEK.   pravastatin (PRAVACHOL) 40 MG tablet TAKE 1 TABLET BY MOUTH EVERY DAY   pyridOXINE (VITAMIN B-6) 25 MG tablet Take 1 tablet by mouth daily.   sildenafil (VIAGRA) 100 MG tablet Take 0.5-1 tablets (50-100 mg total) by mouth daily as needed for erectile dysfunction.   SitaGLIPtin-MetFORMIN HCl (JANUMET XR) [phone] MG TB24 Take 1 tablet by mouth daily.   triamcinolone ointment (KENALOG) 0.1 % Apply topically 2 (two) times daily.   valACYclovir (VALTREX) 1000 MG tablet Take 1,000 mg by mouth 3 (three) times daily.   [DISCONTINUED] hydroxychloroquine (PLAQUENIL) 200 MG tablet Take by mouth.    PHQ 2/9 Scores 12/04/2020 10/24/2020 06/20/2020 04/20/2020  PHQ - 2 Score 0 0 0 0  PHQ- 9 Score 0 3 1 0    GAD 7 : Generalized Anxiety Score 12/04/2020 10/24/2020 06/20/2020 04/20/2020  Nervous, Anxious, on Edge 0 0 0 0  Control/stop worrying 0 0 0 0  Worry too much - different things 0 0 0 0  Trouble relaxing 0 0 0 0  Restless 0 0 0 0  Easily annoyed or irritable 0 0 0 0  Afraid - awful might happen 0 0 0 0  Total GAD 7 Score 0 0 0 0  Anxiety Difficulty - Not difficult at all - -    BP Readings from Last 3 Encounters:  12/04/20 90/62  10/24/20 (!) 86/58  06/20/20 104/72    Physical Exam Vitals and nursing note reviewed.  Constitutional:      General: He is not in acute distress.    Appearance: He is well-developed.  HENT:     Head: Normocephalic and atraumatic.  Cardiovascular:     Rate and Rhythm: Tachycardia present. Rhythm irregular.     Pulses: Normal pulses.  Pulmonary:      Effort: Pulmonary effort is normal. No respiratory distress.     Breath sounds: No wheezing or rhonchi.  Musculoskeletal:     Cervical back: Normal range of motion.  Right lower leg: No edema.     Left lower leg: No edema.  Lymphadenopathy:     Cervical: No cervical adenopathy.  Skin:    General: Skin is warm and dry.     Capillary Refill: Capillary refill takes less than 2 seconds.     Findings: Lesion (scattered comedones, healing rash on forehead) present. No rash.  Neurological:     General: No focal deficit present.     Mental Status: He is alert and oriented to person, place, and time.  Psychiatric:        Mood and Affect: Mood normal.        Behavior: Behavior normal.    Wt Readings from Last 3 Encounters:  12/04/20 253 lb (114.8 kg)  10/24/20 265 lb (120.2 kg)  06/20/20 297 lb (134.7 kg)    BP 90/62   Pulse (!) 141   Temp 97.8 F (36.6 C) (Oral)   Ht 5\' 11"  (1.803 m)   Wt 253 lb (114.8 kg)   SpO2 98%   BMI 35.29 kg/m   Assessment and Plan: 1. Paroxysmal atrial fibrillation (HCC) Continue amiodarone; resume diltiazem until seen by Cardiology Pt is already on Xarelto daily  2. Essential (primary) hypertension Clinically stable exam with well controlled BP. Tolerating medications without side effects at this time. Pt to continue current regimen and low sodium diet; benefits of regular exercise as able discussed.  3. 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  Unable to take Humira at this time. Follow up with Rheumatology.  4. Gouty arthritis of left foot Continue allopurinol.   5. TB lung, latent On INH and Pyridoxine   Partially dictated using Editor, commissioning. Any errors are unintentional.  Halina Maidens, MD [facility]  12/04/2020

## 2020-12-26 DIAGNOSIS — N281 Cyst of kidney, acquired: Secondary | ICD-10-CM | POA: Insufficient documentation

## 2021-01-03 ENCOUNTER — Other Ambulatory Visit: Payer: Self-pay | Admitting: Internal Medicine

## 2021-01-03 DIAGNOSIS — M1000 Idiopathic gout, unspecified site: Secondary | ICD-10-CM

## 2021-01-03 NOTE — Telephone Encounter (Signed)
Future OV 03/13/21  Most recent uric acid and Cr WNL. Approved per protocol  Requested Prescriptions  Pending Prescriptions Disp Refills  . allopurinol (ZYLOPRIM) 100 MG tablet [Pharmacy Med Name: ALLOPURINOL 100 MG TABLET] 90 tablet 0    Sig: TAKE 1 TABLET BY MOUTH EVERY DAY     Endocrinology:  Gout Agents Failed - 01/03/2021  9:27 AM      Failed - Uric Acid in normal range and within 360 days    No results found for: POCURA, LABURIC       Failed - Cr in normal range and within 360 days    Creatinine  Date Value Ref Range Status  05/09/2012 1.50 (H) 0.60 - 1.30 mg/dL Final   Creatinine, Ser  Date Value Ref Range Status  06/20/2020 1.82 (H) 0.76 - 1.27 mg/dL Final         Passed - Valid encounter within last 12 months    Recent Outpatient Visits          1 month ago Paroxysmal atrial fibrillation [facility])   [facility] Glean Hess, MD   2 months ago Essential (primary) hypertension   [facility] Glean Hess, MD   6 months ago Type II diabetes mellitus with complication [facility])   [facility] Glean Hess, MD   8 months ago Facial abscess   [facility] Glean Hess, MD   9 months ago CKD stage G3a/A2, GFR 45-59 and albumin creatinine ratio 30-299 mg/g [facility])   [facility] Glean Hess, MD      Future Appointments            In 2 months Army Melia Jesse Sans, MD [facility], [facility]

## 2021-01-18 LAB — HM DIABETES EYE EXAM

## 2021-02-05 ENCOUNTER — Other Ambulatory Visit: Payer: Self-pay | Admitting: Internal Medicine

## 2021-02-05 DIAGNOSIS — E118 Type 2 diabetes mellitus with unspecified complications: Secondary | ICD-10-CM

## 2021-02-05 NOTE — Telephone Encounter (Signed)
Requested Prescriptions  Pending Prescriptions Disp Refills  . OZEMPIC, 1 MG/DOSE, 4 MG/3ML SOPN [Pharmacy Med Name: OZEMPIC 1 MG/DOSE (4 MG/3 ML)] 9 mL 0    Sig: INJECT 1MG  INTO THE SKIN ONCE A WEEK     Endocrinology:  Diabetes - GLP-1 Receptor Agonists Failed - 02/05/2021 10:54 AM      Failed - HBA1C is between 0 and 7.9 and within 180 days    Hemoglobin A1C  Date Value Ref Range Status  10/01/2020 8.2  Final         Passed - Valid encounter within last 6 months    Recent Outpatient Visits          2 months ago Paroxysmal atrial fibrillation [facility])   [facility] Glean Hess, MD   3 months ago Essential (primary) hypertension   [facility] Glean Hess, MD   7 months ago Type II diabetes mellitus with complication [facility])   [facility] Glean Hess, MD   9 months ago Facial abscess   [facility] Glean Hess, MD   10 months ago CKD stage G3a/A2, GFR 45-59 and albumin creatinine ratio 30-299 mg/g [facility])   [facility] Glean Hess, MD      Future Appointments            In 1 month Army Melia, Jesse Sans, MD [facility], [facility]

## 2021-02-14 ENCOUNTER — Other Ambulatory Visit: Payer: Self-pay | Admitting: Internal Medicine

## 2021-02-14 DIAGNOSIS — E785 Hyperlipidemia, unspecified: Secondary | ICD-10-CM

## 2021-02-15 NOTE — Telephone Encounter (Signed)
Requested Prescriptions  Pending Prescriptions Disp Refills  . pravastatin (PRAVACHOL) 40 MG tablet [Pharmacy Med Name: PRAVASTATIN SODIUM 40 MG TAB] 90 tablet 1    Sig: TAKE 1 TABLET BY MOUTH EVERY DAY     Cardiovascular:  Antilipid - Statins Failed - 02/14/2021  6:29 PM      Failed - Total Cholesterol in normal range and within 360 days    Cholesterol, Total  Date Value Ref Range Status  06/20/2020 218 (H) 100 - 199 mg/dL Final         Failed - LDL in normal range and within 360 days    LDL Chol Calc (NIH)  Date Value Ref Range Status  06/20/2020 136 (H) 0 - 99 mg/dL Final         Failed - HDL in normal range and within 360 days    HDL  Date Value Ref Range Status  06/20/2020 33 (L) >39 mg/dL Final         Failed - Triglycerides in normal range and within 360 days    Triglycerides  Date Value Ref Range Status  06/20/2020 271 (H) 0 - 149 mg/dL Final         Passed - Patient is not pregnant      Passed - Valid encounter within last 12 months    Recent Outpatient Visits          2 months ago Paroxysmal atrial fibrillation [facility])   [facility] Glean Hess, MD   3 months ago Essential (primary) hypertension   [facility] Glean Hess, MD   8 months ago Type II diabetes mellitus with complication [facility])   [facility] Glean Hess, MD   10 months ago Facial abscess   [facility] Glean Hess, MD   11 months ago CKD stage G3a/A2, GFR 45-59 and albumin creatinine ratio 30-299 mg/g [facility])   [facility] Glean Hess, MD      Future Appointments            In 3 weeks Army Melia Jesse Sans, MD [facility], [facility]

## 2021-03-13 ENCOUNTER — Encounter: Payer: 59 | Admitting: Internal Medicine

## 2021-03-15 ENCOUNTER — Other Ambulatory Visit: Payer: Self-pay | Admitting: Internal Medicine

## 2021-03-15 DIAGNOSIS — M1000 Idiopathic gout, unspecified site: Secondary | ICD-10-CM

## 2021-03-15 DIAGNOSIS — E118 Type 2 diabetes mellitus with unspecified complications: Secondary | ICD-10-CM

## 2021-03-15 NOTE — Telephone Encounter (Signed)
Patient has not been seen by provider Requested Prescriptions  Pending Prescriptions Disp Refills  . allopurinol (ZYLOPRIM) 100 MG tablet [Pharmacy Med Name: ALLOPURINOL 100 MG TABLET] 90 tablet 0    Sig: TAKE 1 TABLET BY MOUTH EVERY DAY     Endocrinology:  Gout Agents Failed - 03/15/2021 11:04 AM      Failed - Uric Acid in normal range and within 360 days    No results found for: POCURA, LABURIC       Failed - Cr in normal range and within 360 days    No results found for: CREATININE, LABCREAU, LABCREA, POCCRE       Failed - Valid encounter within last 12 months    Recent Outpatient Visits   None            . JANUMET XR [id number] MG TB24 [Pharmacy Med Name: JANUMET XR 100-1,000 MG TABLET] 30 tablet 5    Sig: TAKE 1 TABLET BY MOUTH EVERY DAY     Endocrinology:  Diabetes - Biguanide + DPP-4 Inhibitor Combos Failed - 03/15/2021 11:04 AM      Failed - HBA1C is between 0 and 7.9 and within 180 days    No results found for: HGBA1C, LABA1C       Failed - Cr in normal range and within 360 days    No results found for: CREATININE, LABCREAU, LABCREA, POCCRE       Failed - eGFR in normal range and within 360 days    No results found for: GFRAA, GFRNONAA, GFR, EGFR       Failed - Valid encounter within last 6 months    Recent Outpatient Visits   None

## 2021-04-12 ENCOUNTER — Other Ambulatory Visit: Payer: Self-pay | Admitting: Internal Medicine

## 2021-04-12 ENCOUNTER — Other Ambulatory Visit: Payer: Self-pay

## 2021-04-12 DIAGNOSIS — M05771 Rheumatoid arthritis with rheumatoid factor of right ankle and foot without organ or systems involvement: Secondary | ICD-10-CM

## 2021-04-12 DIAGNOSIS — M130 Polyarthritis, unspecified: Secondary | ICD-10-CM

## 2021-04-12 DIAGNOSIS — E118 Type 2 diabetes mellitus with unspecified complications: Secondary | ICD-10-CM

## 2021-04-12 DIAGNOSIS — M1000 Idiopathic gout, unspecified site: Secondary | ICD-10-CM

## 2021-04-12 MED ORDER — JANUMET XR 100-1000 MG PO TB24: 1.0000 | ORAL_TABLET | Freq: Every day | ORAL | 1 refills | Status: DC

## 2021-04-12 MED ORDER — ALLOPURINOL 100 MG PO TABS: 100.0000 mg | ORAL_TABLET | Freq: Every day | ORAL | 0 refills | Status: DC

## 2021-04-12 NOTE — Addendum Note (Signed)
Addended by: Matilde Sprang on: 04/12/2021 02:22 PM   Modules accepted: Orders

## 2021-04-12 NOTE — Telephone Encounter (Signed)
Requested medication (s) are due for refill today: Yes  Requested medication (s) are on the active medication list: Yes  Last refill:  3-4 months ago  Future visit scheduled:Yes  Notes to clinic:  Unable to refill per protocol, cannot delegate, no updated URIC acid level    Requested Prescriptions  Pending Prescriptions Disp Refills   allopurinol (ZYLOPRIM) 100 MG tablet 90 tablet 0    Sig: Take 1 tablet (100 mg total) by mouth daily.     Endocrinology:  Gout Agents Failed - 04/12/2021  2:22 PM      Failed - Uric Acid in normal range and within 360 days    No results found for: POCURA, LABURIC        Failed - Cr in normal range and within 360 days    Creatinine  Date Value Ref Range Status  05/09/2012 1.50 (H) 0.60 - 1.30 mg/dL Final   Creatinine, Ser  Date Value Ref Range Status  06/20/2020 1.82 (H) 0.76 - 1.27 mg/dL Final          Passed - Valid encounter within last 12 months    Recent Outpatient Visits           4 months ago Paroxysmal atrial fibrillation [facility])   [facility] Glean Hess, MD   5 months ago Essential (primary) hypertension   [facility] Glean Hess, MD   9 months ago Type II diabetes mellitus with complication [facility])   [facility] Glean Hess, MD   11 months ago Facial abscess   [facility] Glean Hess, MD   1 year ago CKD stage G3a/A2, GFR 45-59 and albumin creatinine ratio 30-299 mg/g [facility])   [facility] Glean Hess, MD       Future Appointments             In 3 weeks Glean Hess, MD [facility], [facility]             hydroxychloroquine (PLAQUENIL) 200 MG tablet      Sig: Take 1 tablet (200 mg total) by mouth daily.     Not Delegated - Analgesics:  Antirheumatic Agents - abatacept & hydroxychloroquine Failed - 04/12/2021  2:22 PM      Failed - This refill cannot be delegated      Passed - Valid encounter within last 6 months    Recent  Outpatient Visits           4 months ago Paroxysmal atrial fibrillation [facility])   [facility] Glean Hess, MD   5 months ago Essential (primary) hypertension   [facility] Glean Hess, MD   9 months ago Type II diabetes mellitus with complication [facility])   [facility] Glean Hess, MD   11 months ago Facial abscess   [facility] Glean Hess, MD   1 year ago CKD stage G3a/A2, GFR 45-59 and albumin creatinine ratio 30-299 mg/g [facility])   [facility] Glean Hess, MD       Future Appointments             In 3 weeks Glean Hess, MD [facility], [facility]            Signed Prescriptions Disp Refills   SitaGLIPtin-MetFORMIN HCl (JANUMET XR) [phone] MG TB24 90 tablet 1    Sig: Take 1 tablet by mouth daily.     Endocrinology:  Diabetes - Biguanide + DPP-4 Inhibitor Combos Failed - 04/12/2021  2:22 PM      Failed - HBA1C is between 0 and 7.9 and within 180 days    Hemoglobin A1C  Date Value Ref Range Status  10/01/2020 8.2  Final          Failed - Cr in normal range and within 360 days    Creatinine  Date Value Ref Range Status  05/09/2012 1.50 (H) 0.60 - 1.30 mg/dL Final   Creatinine, Ser  Date Value Ref Range Status  06/20/2020 1.82 (H) 0.76 - 1.27 mg/dL Final          Failed - AA eGFR in normal range and within 360 days    EGFR (African American)  Date Value Ref Range Status  05/09/2012 >60  Final   GFR calc Af Amer  Date Value Ref Range Status  03/05/2020 39 (L) >59 mL/min/1.73 Final    Comment:    **In accordance with recommendations from the NKF-ASN Task force,**   [facility] is in the process of updating its eGFR calculation to the   2021 CKD-EPI creatinine equation that estimates kidney function   without a race variable.    EGFR (Non-African Amer.)  Date Value Ref Range Status  05/09/2012 53 (L)  Final    Comment:    eGFR values <57mL/min/1.73 m2 may be an indication of  chronic kidney disease (CKD). Calculated eGFR is useful in patients with stable renal function. The eGFR calculation will not be reliable in acutely ill patients when serum creatinine is changing rapidly. It is not useful in  patients on dialysis. The eGFR calculation may not be applicable to patients at the low and high extremes of body sizes, pregnant women, and vegetarians.    GFR calc non Af Amer  Date Value Ref Range Status  03/05/2020 33 (L) >59 mL/min/1.73 Final   eGFR  Date Value Ref Range Status  06/20/2020 42 (L) >59 mL/min/1.73 Final          Passed - Valid encounter within last 6 months    Recent Outpatient Visits           4 months ago Paroxysmal atrial fibrillation [facility])   [facility] Glean Hess, MD   5 months ago Essential (primary) hypertension   [facility] Glean Hess, MD   9 months ago Type II diabetes mellitus with complication [facility])   [facility] Glean Hess, MD   11 months ago Facial abscess   [facility] Glean Hess, MD   1 year ago CKD stage G3a/A2, GFR 45-59 and albumin creatinine ratio 30-299 mg/g [facility])   [facility] Glean Hess, MD       Future Appointments             In 3 weeks Army Melia Jesse Sans, MD [facility], [facility]

## 2021-04-12 NOTE — Telephone Encounter (Signed)
Refusing meds due to duplicate chart, will request med refills in original chart.

## 2021-04-12 NOTE — Telephone Encounter (Signed)
Please review.  KP

## 2021-04-12 NOTE — Telephone Encounter (Signed)
Patient called due to a refill request came through on this duplicate chart created on 03/14/21. I advised there's another chart with DOB 03/23/1960. He says he has been using 03/23/1960 DOB for years and didn't realize it needed changing. He says his correct DOB is [birth year]. Advised the charts will be merged with the correct DOB [birth year].

## 2021-04-12 NOTE — Telephone Encounter (Signed)
Patient called due to a refill request came through on a duplicate chart created on 03/14/21 with the correct DOB 24-Apr-1960. He says he has been using 03/23/1960 DOB for years and didn't realize it needed changing. Advised the charts will be merged with the correct DOB June 22, 1960.

## 2021-04-12 NOTE — Telephone Encounter (Signed)
Called pt to call [facility] for his refill medication. Dr. Army Melia does not prescribe medication. Pt verbalized understanding.  KP

## 2021-05-06 ENCOUNTER — Other Ambulatory Visit: Payer: Self-pay

## 2021-05-06 ENCOUNTER — Encounter: Payer: Self-pay | Admitting: Internal Medicine

## 2021-05-06 ENCOUNTER — Ambulatory Visit (INDEPENDENT_AMBULATORY_CARE_PROVIDER_SITE_OTHER): Payer: Medicare HMO | Admitting: Internal Medicine

## 2021-05-06 ENCOUNTER — Other Ambulatory Visit: Payer: Self-pay | Admitting: Internal Medicine

## 2021-05-06 VITALS — BP 126/84 | HR 98 | Ht 71.0 in | Wt 275.0 lb

## 2021-05-06 DIAGNOSIS — R7612 Nonspecific reaction to cell mediated immunity measurement of gamma interferon antigen response without active tuberculosis: Secondary | ICD-10-CM

## 2021-05-06 DIAGNOSIS — Z0000 Encounter for general adult medical examination without abnormal findings: Secondary | ICD-10-CM

## 2021-05-06 DIAGNOSIS — I480 Paroxysmal atrial fibrillation: Secondary | ICD-10-CM

## 2021-05-06 DIAGNOSIS — M05772 Rheumatoid arthritis with rheumatoid factor of left ankle and foot without organ or systems involvement: Secondary | ICD-10-CM | POA: Diagnosis not present

## 2021-05-06 DIAGNOSIS — D6869 Other thrombophilia: Secondary | ICD-10-CM

## 2021-05-06 DIAGNOSIS — E1122 Type 2 diabetes mellitus with diabetic chronic kidney disease: Secondary | ICD-10-CM | POA: Diagnosis not present

## 2021-05-06 DIAGNOSIS — D126 Benign neoplasm of colon, unspecified: Secondary | ICD-10-CM

## 2021-05-06 DIAGNOSIS — E118 Type 2 diabetes mellitus with unspecified complications: Secondary | ICD-10-CM | POA: Diagnosis not present

## 2021-05-06 DIAGNOSIS — Z125 Encounter for screening for malignant neoplasm of prostate: Secondary | ICD-10-CM | POA: Diagnosis not present

## 2021-05-06 DIAGNOSIS — I10 Essential (primary) hypertension: Secondary | ICD-10-CM

## 2021-05-06 DIAGNOSIS — Z1211 Encounter for screening for malignant neoplasm of colon: Secondary | ICD-10-CM | POA: Diagnosis not present

## 2021-05-06 MED ORDER — LOSARTAN POTASSIUM-HCTZ 100-25 MG PO TABS: 1.0000 | ORAL_TABLET | Freq: Every day | ORAL | 1 refills | Status: DC

## 2021-05-06 MED ORDER — OZEMPIC (1 MG/DOSE) 4 MG/3ML ~~LOC~~ SOPN: 1.0000 mg | PEN_INJECTOR | SUBCUTANEOUS | 1 refills | Status: DC

## 2021-05-06 NOTE — Progress Notes (Signed)
Date:  05/06/2021   Name:  Jon Mendez   DOB:  03/23/1960   MRN:  [id number]   Chief Complaint: Annual Exam, Diabetes, and Hypertension Jon Mendez is a 61 y.o. male who presents today for his Complete Annual Exam. He feels well. He reports exercising none. He reports he is sleeping well.   Colonoscopy: 05/2015 overdue  Immunization History  Administered Date(s) Administered   PFIZER(Purple Top)SARS-COV-2 Vaccination 06/23/2019, 07/14/2019, 03/06/2020   Pneumococcal Polysaccharide-23 04/28/2016   Tdap 04/09/2011    Lab Results  Component Value Date   PSA1 2.1 01/31/2019   PSA1 1.8 12/24/2017   PSA1 1.0 04/28/2016   PSA 0.7 11/10/2012    Diabetes He presents for his follow-up diabetic visit. He has type 2 diabetes mellitus. Pertinent negatives for hypoglycemia include no dizziness, headaches or nervousness/anxiousness. Associated symptoms include fatigue. Pertinent negatives for diabetes include no chest pain. Current diabetic treatment includes oral agent (triple therapy). He is compliant with treatment all of the time. His breakfast blood glucose is taken between 6-7 am. His breakfast blood glucose range is generally 110-130 mg/dl. An ACE inhibitor/angiotensin II receptor blocker is being taken. Eye exam is current.  Hypertension This is a chronic problem. The problem is controlled. Pertinent negatives include no chest pain, headaches, palpitations or shortness of breath.  Hyperlipidemia Pertinent negatives include no chest pain, myalgias or shortness of breath.  HS - now back on Humira but lesion are still draining but improved. RA - still has hip pain and knee pain but improving since the Humira starting dose.  +Quantiferon Gold - on INH from the health department.  They recommended 6 mo of therapy but since starting Humira, they now want him to take it for 9 months.  Lab Results  Component Value Date   NA 132 (L) 06/20/2020   K 4.4 06/20/2020   CO2 20  06/20/2020   GLUCOSE 212 (H) 06/20/2020   BUN 14 06/20/2020   CREATININE 1.82 (H) 06/20/2020   CALCIUM 10.1 06/20/2020   EGFR 42 (L) 06/20/2020   GFRNONAA 33 (L) 03/05/2020   Lab Results  Component Value Date   CHOL 218 (H) 06/20/2020   HDL 33 (L) 06/20/2020   LDLCALC 136 (H) 06/20/2020   TRIG 271 (H) 06/20/2020   CHOLHDL 6.6 (H) 06/20/2020   Lab Results  Component Value Date   TSH 3.208 05/03/2018   Lab Results  Component Value Date   HGBA1C 8.2 10/01/2020   Lab Results  Component Value Date   WBC 12.7 (H) 01/31/2019   HGB 13.5 01/31/2019   HCT 40.9 01/31/2019   MCV 79 01/31/2019   PLT 263 01/31/2019   Lab Results  Component Value Date   ALT 15 10/28/2019   AST 15 10/28/2019   ALKPHOS 135 (H) 10/28/2019   BILITOT 0.3 10/28/2019   No results found for: 25OHVITD2, 25OHVITD3, VD25OH   Review of Systems  Constitutional:  Positive for fatigue. Negative for appetite change, chills, diaphoresis and unexpected weight change.  HENT:  Negative for hearing loss, tinnitus, trouble swallowing and voice change.   Eyes:  Negative for visual disturbance.  Respiratory:  Negative for choking, shortness of breath and wheezing.   Cardiovascular:  Negative for chest pain, palpitations and leg swelling.  Gastrointestinal:  Negative for abdominal pain, blood in stool, constipation and diarrhea.  Genitourinary:  Negative for difficulty urinating, dysuria and frequency.  Musculoskeletal:  Positive for arthralgias, gait problem and joint swelling. Negative for back pain and  myalgias.  Skin:  Positive for rash and wound. Negative for color change.  Neurological:  Negative for dizziness, syncope and headaches.  Hematological:  Negative for adenopathy.  Psychiatric/Behavioral:  Negative for dysphoric mood and sleep disturbance. The patient is not nervous/anxious.    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   Hip pain, chronic,  right 10/24/2020   Acquired thrombophilia ([place]) 06/20/2020   Paroxysmal atrial fibrillation ([place]) 06/17/2019   Bronchitis 05/03/2018   Type II diabetes mellitus with complication ([place]) 25/63/8937   Tubular adenoma of colon    CKD stage G3a/A2, GFR 45-59 and albumin creatinine ratio 30-299 mg/g (HCC) 04/25/2015   Chronic gouty arthritis 11/22/2014   Hyperlipidemia associated with type 2 diabetes mellitus ([place]) 11/22/2014   Essential (primary) hypertension 11/22/2014   Compulsive tobacco user syndrome 11/22/2014   Varicose veins of both lower extremities 11/22/2014   Hidradenitis suppurativa 09/06/2012   Benign paroxysmal positional nystagmus 07/29/2004    Allergies  Allergen Reactions   Eggs Or Egg-Derived Products Shortness Of Breath    Throat swells. Patient able to eat products that contain egg just not whole egg alone.   Fish Allergy Shortness Of Breath    Throat swells    Past Surgical History:  Procedure Laterality Date   CARDIOVERSION N/A 06/17/2018   Procedure: CARDIOVERSION (CATH LAB);  Surgeon: Minna Merritts, MD;  Location: [facility] ORS;  Service: Cardiovascular;  Laterality: N/A;   COLONOSCOPY WITH PROPOFOL N/A 06/04/2015   Procedure: COLONOSCOPY WITH PROPOFOL;  Surgeon: Lucilla Lame, MD;  5 tubular adenomas   HIP ARTHROPLASTY Left 2013   LEG SKIN LESION  BIOPSY / EXCISION Right 2015   Hidradenitis   POLYPECTOMY  06/04/2015   Procedure: POLYPECTOMY;  Surgeon: Lucilla Lame, MD;  Location: [facility];  Service: Endoscopy;;   VARICOSE VEIN SURGERY Bilateral 2015    Social History   Tobacco Use   Smoking status: Every Day    Packs/day: 0.25    Years: 30.00    Pack years: 7.50    Types: Cigarettes   Smokeless tobacco: Never  Substance Use Topics   Alcohol use: No    Alcohol/week: 4.0 standard drinks    Types: 4 Standard drinks or equivalent per week   Drug use: No     Medication list has been reviewed and updated.  Current Meds  Medication Sig    acyclovir (ZOVIRAX) 400 MG tablet Take 400 mg by mouth 2 (two) times daily.   Adalimumab 40 MG/0.8ML PSKT Inject $RemoveBef'160mg'VjubQbkmbs$  (4 syringes) week 0, $Remov'80mg'PNGFQK$  (2 syringes) week 2 and $Rem'80mg'Evor$  (1 syringe) week 4 and every week thereafter   allopurinol (ZYLOPRIM) 100 MG tablet Take 1 tablet (100 mg total) by mouth daily.   amiodarone (PACERONE) 200 MG tablet Take by mouth.   clindamycin (CLEOCIN T) 1 % lotion Apply topically 2 (two) times daily.   diltiazem (CARDIZEM) 60 MG tablet Take 30 mg by mouth 2 (two) times daily.   doxycycline (ADOXA) 100 MG tablet SMARTSIG:1 Tablet(s) By Mouth Every 12 Hours   glucose blood (FREESTYLE LITE) test strip Use as instructed   hydroxychloroquine (PLAQUENIL) 200 MG tablet Take 200 mg by mouth daily.   isoniazid (NYDRAZID) 300 MG tablet Take 300 mg by mouth daily.   Lancets (FREESTYLE) lancets Use as instructed   losartan-hydrochlorothiazide (HYZAAR) 100-25 MG tablet Take 1 tablet by mouth daily.   metoprolol succinate (TOPROL-XL) 50 MG 24 hr tablet Take 50 mg by  mouth daily.   Multiple Vitamin (MULTIVITAMIN WITH MINERALS) TABS tablet Take 1 tablet by mouth daily. Complete Multivitamin   oxyCODONE (OXY IR/ROXICODONE) 5 MG immediate release tablet Take 5 mg by mouth 4 (four) times daily as needed.   OZEMPIC, 1 MG/DOSE, 4 MG/3ML SOPN INJECT $RemoveBef'1MG'jCFYsYWfpg$  INTO THE SKIN ONCE A WEEK   pravastatin (PRAVACHOL) 40 MG tablet TAKE 1 TABLET BY MOUTH EVERY DAY   pyridOXINE (VITAMIN B-6) 25 MG tablet Take 1 tablet by mouth daily.   rivaroxaban (XARELTO) 20 MG TABS tablet Take 1 tablet (20 mg total) by mouth daily.   sildenafil (VIAGRA) 100 MG tablet Take 0.5-1 tablets (50-100 mg total) by mouth daily as needed for erectile dysfunction.   SitaGLIPtin-MetFORMIN HCl (JANUMET XR) [phone] MG TB24 Take 1 tablet by mouth daily.   triamcinolone ointment (KENALOG) 0.1 % Apply topically 2 (two) times daily.   valACYclovir (VALTREX) 1000 MG tablet Take 1,000 mg by mouth 3 (three) times daily.    PHQ 2/9  Scores 12/04/2020 10/24/2020 06/20/2020 04/20/2020  PHQ - 2 Score 0 0 0 0  PHQ- 9 Score 0 3 1 0    GAD 7 : Generalized Anxiety Score 12/04/2020 10/24/2020 06/20/2020 04/20/2020  Nervous, Anxious, on Edge 0 0 0 0  Control/stop worrying 0 0 0 0  Worry too much - different things 0 0 0 0  Trouble relaxing 0 0 0 0  Restless 0 0 0 0  Easily annoyed or irritable 0 0 0 0  Afraid - awful might happen 0 0 0 0  Total GAD 7 Score 0 0 0 0  Anxiety Difficulty - Not difficult at all - -    BP Readings from Last 3 Encounters:  05/06/21 126/84  12/04/20 90/62  10/24/20 (!) 86/58    Physical Exam Vitals and nursing note reviewed.  Constitutional:      General: He is not in acute distress.    Appearance: Normal appearance. He is well-developed.  HENT:     Head: Normocephalic and atraumatic.     Right Ear: Tympanic membrane, ear canal and external ear normal.     Left Ear: Tympanic membrane, ear canal and external ear normal.     Nose: Nose normal.  Eyes:     Conjunctiva/sclera: Conjunctivae normal.     Pupils: Pupils are equal, round, and reactive to light.  Neck:     Thyroid: No thyromegaly.     Vascular: No carotid bruit.  Cardiovascular:     Rate and Rhythm: Normal rate and regular rhythm.     Heart sounds: Normal heart sounds.  Pulmonary:     Effort: Pulmonary effort is normal. No respiratory distress.     Breath sounds: Normal breath sounds. No wheezing.  Chest:  Breasts:    Right: No mass.     Left: No mass.  Abdominal:     General: Bowel sounds are normal.     Palpations: Abdomen is soft.     Tenderness: There is no abdominal tenderness.       Comments: Dressing with scant drainage  Musculoskeletal:        General: Normal range of motion.     Cervical back: Normal range of motion and neck supple.  Lymphadenopathy:     Cervical: No cervical adenopathy.  Skin:    General: Skin is warm and dry.     Findings: Erythema, lesion and rash present.  Neurological:     Mental  Status: He is alert and oriented to person,  place, and time.     Deep Tendon Reflexes: Reflexes are normal and symmetric.  Psychiatric:        Attention and Perception: Attention normal.        Mood and Affect: Mood normal.        Behavior: Behavior normal.        Thought Content: Thought content normal.    Wt Readings from Last 3 Encounters:  05/06/21 275 lb (124.7 kg)  12/04/20 253 lb (114.8 kg)  10/24/20 265 lb (120.2 kg)    BP 126/84    Pulse 98    Ht $R'5\' 11"'iY$  (1.803 m)    Wt 275 lb (124.7 kg)    SpO2 100%    BMI 38.35 kg/m   Assessment and Plan: 1. Annual physical exam Exam is normal except for weight. Encourage regular exercise and appropriate dietary changes. Skin and joint sx improving. Vaccinations up to date.  2. Colon cancer screening Will message Dr. Allen Norris about repeat testing from 2017 multiple TAs.  3. Prostate cancer screening DRE not performed - PSA  4. 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  5. Type II diabetes mellitus with complication ([place]) Clinically stable by exam and report without s/s of hypoglycemia. DM complicated by hypertension and dyslipidemia. Tolerating medications well without side effects or other concerns.  6. Essential (primary) hypertension Clinically stable exam with well controlled BP. Tolerating medications without side effects at this time. Pt to continue current regimen and low sodium diet; benefits of regular exercise as able discussed. - losartan-hydrochlorothiazide (HYZAAR) 100-25 MG tablet; Take 1 tablet by mouth daily.  Dispense: 90 tablet; Refill: 1 - CBC with Differential/Platelet - TSH  7. Type 2 diabetes mellitus with diabetic chronic kidney disease, unspecified CKD stage, unspecified whether long term insulin use (HCC) - Semaglutide, 1 MG/DOSE, (OZEMPIC, 1 MG/DOSE,) 4 MG/3ML SOPN; Inject 1 mg into the skin once a week.   Dispense: 9 mL; Refill: 1 - Comprehensive metabolic panel - Hemoglobin A1c - Lipid panel - Microalbumin / creatinine urine ratio  8. Paroxysmal atrial fibrillation (HCC) Rate controlled   9. Acquired thrombophilia ([place])  No bleeding issues  10. Positive QuantiFERON-TB Gold test Continue INH from HD for a 9 mo course.   Partially dictated using Editor, commissioning. Any errors are unintentional.  Halina Maidens, MD [facility]  05/06/2021

## 2021-05-06 NOTE — Progress Notes (Deleted)
Date:  05/06/2021   Name:  Jon Mendez   DOB:  03/23/1960   MRN:  [id number]   Chief Complaint: No chief complaint on file.  HPI  Lab Results  Component Value Date   NA 132 (L) 06/20/2020   K 4.4 06/20/2020   CO2 20 06/20/2020   GLUCOSE 212 (H) 06/20/2020   BUN 14 06/20/2020   CREATININE 1.82 (H) 06/20/2020   CALCIUM 10.1 06/20/2020   EGFR 42 (L) 06/20/2020   GFRNONAA 33 (L) 03/05/2020   Lab Results  Component Value Date   CHOL 218 (H) 06/20/2020   HDL 33 (L) 06/20/2020   LDLCALC 136 (H) 06/20/2020   TRIG 271 (H) 06/20/2020   CHOLHDL 6.6 (H) 06/20/2020   Lab Results  Component Value Date   TSH 3.208 05/03/2018   Lab Results  Component Value Date   HGBA1C 8.2 10/01/2020   Lab Results  Component Value Date   WBC 12.7 (H) 01/31/2019   HGB 13.5 01/31/2019   HCT 40.9 01/31/2019   MCV 79 01/31/2019   PLT 263 01/31/2019   Lab Results  Component Value Date   ALT 15 10/28/2019   AST 15 10/28/2019   ALKPHOS 135 (H) 10/28/2019   BILITOT 0.3 10/28/2019   No results found for: 25OHVITD2, 25OHVITD3, VD25OH   Review of Systems  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   Hip pain, chronic, right 10/24/2020   Acquired thrombophilia ([place]) 06/20/2020   Paroxysmal atrial fibrillation ([place]) 06/17/2019   Bronchitis 05/03/2018   Type II diabetes mellitus with complication ([place]) 81/27/5170   Tubular adenoma of colon    CKD stage G3a/A2, GFR 45-59 and albumin creatinine ratio 30-299 mg/g (HCC) 04/25/2015   Chronic gouty arthritis 11/22/2014   Hyperlipidemia associated with type 2 diabetes mellitus ([place]) 11/22/2014   Essential (primary) hypertension 11/22/2014   Compulsive tobacco user syndrome 11/22/2014   Varicose veins of both lower extremities 11/22/2014   Hidradenitis suppurativa 09/06/2012   Benign paroxysmal positional nystagmus 07/29/2004    Allergies  Allergen Reactions   Eggs Or  Egg-Derived Products Shortness Of Breath    Throat swells. Patient able to eat products that contain egg just not whole egg alone.   Fish Allergy Shortness Of Breath    Throat swells    Past Surgical History:  Procedure Laterality Date   CARDIOVERSION N/A 06/17/2018   Procedure: CARDIOVERSION (CATH LAB);  Surgeon: Minna Merritts, MD;  Location: [facility] ORS;  Service: Cardiovascular;  Laterality: N/A;   COLONOSCOPY WITH PROPOFOL N/A 06/04/2015   Procedure: COLONOSCOPY WITH PROPOFOL;  Surgeon: Lucilla Lame, MD;  5 tubular adenomas   HIP ARTHROPLASTY Left 2013   LEG SKIN LESION  BIOPSY / EXCISION Right 2015   Hidradenitis   POLYPECTOMY  06/04/2015   Procedure: POLYPECTOMY;  Surgeon: Lucilla Lame, MD;  Location: [facility];  Service: Endoscopy;;   VARICOSE VEIN SURGERY Bilateral 2015    Social History   Tobacco Use   Smoking status: Every Day    Packs/day: 0.25    Years: 30.00    Pack years: 7.50    Types: Cigarettes   Smokeless tobacco: Never  Substance Use Topics   Alcohol use: No    Alcohol/week: 4.0 standard drinks    Types: 4 Standard drinks or equivalent per week   Drug use: No     Medication list has been reviewed and updated.  No outpatient medications have been  marked as taking for the 05/06/21 encounter (Office Visit) with Glean Hess, MD.    [facility] 2/9 Scores 12/04/2020 10/24/2020 06/20/2020 04/20/2020  PHQ - 2 Score 0 0 0 0  PHQ- 9 Score 0 3 1 0    GAD 7 : Generalized Anxiety Score 12/04/2020 10/24/2020 06/20/2020 04/20/2020  Nervous, Anxious, on Edge 0 0 0 0  Control/stop worrying 0 0 0 0  Worry too much - different things 0 0 0 0  Trouble relaxing 0 0 0 0  Restless 0 0 0 0  Easily annoyed or irritable 0 0 0 0  Afraid - awful might happen 0 0 0 0  Total GAD 7 Score 0 0 0 0  Anxiety Difficulty - Not difficult at all - -    BP Readings from Last 3 Encounters:  12/04/20 90/62  10/24/20 (!) 86/58  06/20/20 104/72    Physical Exam  Wt Readings  from Last 3 Encounters:  12/04/20 253 lb (114.8 kg)  10/24/20 265 lb (120.2 kg)  06/20/20 297 lb (134.7 kg)    There were no vitals taken for this visit.  Assessment and Plan:

## 2021-05-07 LAB — CBC WITH DIFFERENTIAL/PLATELET
Basophils Absolute: 0.1 10*3/uL (ref 0.0–0.2)
Basos: 1 %
EOS (ABSOLUTE): 0.3 10*3/uL (ref 0.0–0.4)
Eos: 2 %
Hematocrit: 36.9 % — ABNORMAL LOW (ref 37.5–51.0)
Hemoglobin: 11.7 g/dL — ABNORMAL LOW (ref 13.0–17.7)
Immature Grans (Abs): 0.1 10*3/uL (ref 0.0–0.1)
Immature Granulocytes: 1 %
Lymphocytes Absolute: 3.5 10*3/uL — ABNORMAL HIGH (ref 0.7–3.1)
Lymphs: 23 %
MCH: 22.5 pg — ABNORMAL LOW (ref 26.6–33.0)
MCHC: 31.7 g/dL (ref 31.5–35.7)
MCV: 71 fL — ABNORMAL LOW (ref 79–97)
Monocytes Absolute: 0.9 10*3/uL (ref 0.1–0.9)
Monocytes: 6 %
Neutrophils Absolute: 10.2 10*3/uL — ABNORMAL HIGH (ref 1.4–7.0)
Neutrophils: 67 %
Platelets: 447 10*3/uL (ref 150–450)
RBC: 5.19 x10E6/uL (ref 4.14–5.80)
RDW: 16.5 % — ABNORMAL HIGH (ref 11.6–15.4)
WBC: 15.1 10*3/uL — ABNORMAL HIGH (ref 3.4–10.8)

## 2021-05-07 LAB — COMPREHENSIVE METABOLIC PANEL
ALT: 14 IU/L (ref 0–44)
AST: 24 IU/L (ref 0–40)
Albumin/Globulin Ratio: 0.7 — ABNORMAL LOW (ref 1.2–2.2)
Albumin: 3.5 g/dL — ABNORMAL LOW (ref 3.8–4.9)
Alkaline Phosphatase: 122 IU/L — ABNORMAL HIGH (ref 44–121)
BUN/Creatinine Ratio: 11 (ref 10–24)
BUN: 17 mg/dL (ref 8–27)
Bilirubin Total: 0.4 mg/dL (ref 0.0–1.2)
CO2: 22 mmol/L (ref 20–29)
Calcium: 9.3 mg/dL (ref 8.6–10.2)
Chloride: 102 mmol/L (ref 96–106)
Creatinine, Ser: 1.55 mg/dL — ABNORMAL HIGH (ref 0.76–1.27)
Globulin, Total: 4.7 g/dL — ABNORMAL HIGH (ref 1.5–4.5)
Glucose: 84 mg/dL (ref 70–99)
Potassium: 4.8 mmol/L (ref 3.5–5.2)
Sodium: 138 mmol/L (ref 134–144)
Total Protein: 8.2 g/dL (ref 6.0–8.5)
eGFR: 51 mL/min/{1.73_m2} — ABNORMAL LOW (ref >59)

## 2021-05-07 LAB — LIPID PANEL
Chol/HDL Ratio: 3.3 ratio (ref 0.0–5.0)
Cholesterol, Total: 146 mg/dL (ref 100–199)
HDL: 44 mg/dL (ref >39)
LDL Chol Calc (NIH): 78 mg/dL (ref 0–99)
Triglycerides: 133 mg/dL (ref 0–149)
VLDL Cholesterol Cal: 24 mg/dL (ref 5–40)

## 2021-05-07 LAB — TSH: TSH: 2.43 u[IU]/mL (ref 0.450–4.500)

## 2021-05-07 LAB — MICROALBUMIN / CREATININE URINE RATIO
Creatinine, Urine: 111.1 mg/dL
Microalb/Creat Ratio: 61 mg/g creat — ABNORMAL HIGH (ref 0–29)
Microalbumin, Urine: 67.7 ug/mL

## 2021-05-07 LAB — PSA: Prostate Specific Ag, Serum: 18.2 ng/mL — ABNORMAL HIGH (ref 0.0–4.0)

## 2021-05-07 LAB — HEMOGLOBIN A1C
Est. average glucose Bld gHb Est-mCnc: 148 mg/dL
Hgb A1c MFr Bld: 6.8 % — ABNORMAL HIGH (ref 4.8–5.6)

## 2021-05-10 NOTE — Progress Notes (Signed)
Called pt left VM to call back. ? ?[facility] nurse may give results to patient if they return call to clinic, a CRM has been created. ? ?KP

## 2021-06-03 ENCOUNTER — Other Ambulatory Visit: Payer: Self-pay

## 2021-06-03 DIAGNOSIS — R972 Elevated prostate specific antigen [PSA]: Secondary | ICD-10-CM

## 2021-06-14 ENCOUNTER — Encounter: Payer: Self-pay | Admitting: Urology

## 2021-06-14 ENCOUNTER — Ambulatory Visit: Payer: Medicare HMO | Admitting: Urology

## 2021-06-14 ENCOUNTER — Other Ambulatory Visit: Payer: Self-pay

## 2021-06-14 VITALS — BP 127/82 | HR 97 | Ht 71.0 in | Wt 288.0 lb

## 2021-06-14 DIAGNOSIS — R972 Elevated prostate specific antigen [PSA]: Secondary | ICD-10-CM | POA: Diagnosis not present

## 2021-06-14 NOTE — Progress Notes (Unsigned)
06/14/2021 1:15 PM   Jon Mendez 03/23/1960 [id number]  Referring provider: Glean Hess, MD [address] [place],  [place] [address]  Chief Complaint  Patient presents with   Elevated PSA    HPI: Jon Mendez is a 61 y.o. male who presents for evaluation of an elevated PSA.  PSA drawn 05/06/2021 elevated 18.2 Last PSA 2 years prior was 2.1 No bothersome LUTS, dysuria or gross hematuria Has a brother who was diagnosed with prostate cancer in his 58s and treated with radiation On Xarelto for chronic A-fib and Humira for hidradenitis   PMH: Past Medical History:  Diagnosis Date   CKD (chronic kidney disease), stage III ([place])    Diabetes mellitus without complication ([place])    H/O hidradenitis suppurativa    H/O varicose veins    High cholesterol    Hypertension    Vertigo     Surgical History: Past Surgical History:  Procedure Laterality Date   CARDIOVERSION N/A 06/17/2018   Procedure: CARDIOVERSION (CATH LAB);  Surgeon: Minna Merritts, MD;  Location: [facility];  Service: Cardiovascular;  Laterality: N/A;   COLONOSCOPY WITH PROPOFOL N/A 06/04/2015   Procedure: COLONOSCOPY WITH PROPOFOL;  Surgeon: Lucilla Lame, MD;  5 tubular adenomas   HIP ARTHROPLASTY Left 2013   LEG SKIN LESION  BIOPSY / EXCISION Right 2015   Hidradenitis   POLYPECTOMY  06/04/2015   Procedure: POLYPECTOMY;  Surgeon: Lucilla Lame, MD;  Location: [place];  Service: Endoscopy;;   VARICOSE VEIN SURGERY Bilateral 2015    Home Medications:  Allergies as of 06/14/2021       Reactions   Eggs Or Egg-derived Products Shortness Of Breath   Throat swells. Patient able to eat products that contain egg just not whole egg alone.   Fish Allergy Shortness Of Breath   Throat swells        Medication List        Accurate as of June 14, 2021  1:15 PM. If you have any questions, ask your nurse or doctor.          acyclovir 400 MG tablet Commonly known as:  ZOVIRAX Take 400 mg by mouth 2 (two) times daily.   Adalimumab 40 MG/0.8ML Pskt Inject 160mg  (4 syringes) week 0, 80mg  (2 syringes) week 2 and 80mg  (1 syringe) week 4 and every week thereafter   allopurinol 100 MG tablet Commonly known as: ZYLOPRIM Take 1 tablet (100 mg total) by mouth daily.   amiodarone 200 MG tablet Commonly known as: PACERONE Take by mouth.   clindamycin 1 % lotion Commonly known as: CLEOCIN T Apply topically 2 (two) times daily.   diltiazem 60 MG tablet Commonly known as: CARDIZEM Take 30 mg by mouth 2 (two) times daily.   doxycycline 100 MG tablet Commonly known as: ADOXA SMARTSIG:1 Tablet(s) By Mouth Every 12 Hours   freestyle lancets Use as instructed   FREESTYLE LITE test strip Generic drug: glucose blood Use as instructed   hydroxychloroquine 200 MG tablet Commonly known as: PLAQUENIL Take 200 mg by mouth daily.   isoniazid 300 MG tablet Commonly known as: NYDRAZID Take 300 mg by mouth daily.   Janumet XR [phone] MG Tb24 Generic drug: SitaGLIPtin-MetFORMIN HCl Take 1 tablet by mouth daily.   losartan-hydrochlorothiazide 100-25 MG tablet Commonly known as: HYZAAR Take 1 tablet by mouth daily.   metoprolol succinate 50 MG 24 hr tablet Commonly known as: TOPROL-XL Take 50 mg by mouth daily.   multivitamin  with minerals Tabs tablet Take 1 tablet by mouth daily. Complete Multivitamin   oxyCODONE 5 MG immediate release tablet Commonly known as: Oxy IR/ROXICODONE Take 5 mg by mouth 4 (four) times daily as needed.   Ozempic (1 MG/DOSE) 4 MG/3ML Sopn Generic drug: Semaglutide (1 MG/DOSE) Inject 1 mg into the skin once a week.   pravastatin 40 MG tablet Commonly known as: PRAVACHOL TAKE 1 TABLET BY MOUTH EVERY DAY   pyridOXINE 25 MG tablet Commonly known as: VITAMIN B-6 Take 1 tablet by mouth daily.   rivaroxaban 20 MG Tabs tablet Commonly known as: XARELTO Take 1 tablet (20 mg total) by mouth daily.   sildenafil 100 MG  tablet Commonly known as: Viagra Take 0.5-1 tablets (50-100 mg total) by mouth daily as needed for erectile dysfunction.   triamcinolone ointment 0.1 % Commonly known as: KENALOG Apply topically 2 (two) times daily.   valACYclovir 1000 MG tablet Commonly known as: VALTREX Take 1,000 mg by mouth 3 (three) times daily.        Allergies:  Allergies  Allergen Reactions   Eggs Or Egg-Derived Products Shortness Of Breath    Throat swells. Patient able to eat products that contain egg just not whole egg alone.   Fish Allergy Shortness Of Breath    Throat swells    Family History: Family History  Problem Relation Age of Onset   Hypertension Mother    Lymphoma Father    Diabetes Sister    Diabetes Brother    Diabetes Brother     Social History:  reports that he has been smoking cigarettes. He has a 7.50 pack-year smoking history. He has never used smokeless tobacco. He reports that he does not drink alcohol and does not use drugs.   Physical Exam: BP 127/82    Pulse 97    Ht 5\' 11"  (1.803 m)    Wt 288 lb (130.6 kg)    BMI 40.17 kg/m   Constitutional:  Alert and oriented, No acute distress. HEENT: [place] AT, moist mucus membranes.  Trachea midline, no masses. Cardiovascular: No clubbing, cyanosis, or edema. Respiratory: Normal respiratory effort, no increased work of breathing. GI: Abdomen is soft, nontender, nondistended, no abdominal masses GU: Prostate 35 g, smooth without nodules Skin: No rashes, bruises or suspicious lesions. Neurologic: Grossly intact, no focal deficits, moving all 4 extremities. Psychiatric: Normal mood and affect.  Laboratory Data:  Urinalysis    Assessment & Plan:    1.  Elevated PSA We discussed a PSA >10 is more worrisome for prostate cancer Will check UA to make sure no pyuria Repeat PSA and if it remains elevated would recommend scheduling prostate biopsy The procedure was discussed including potential risks of bleeding and  infection/sepsis   Abbie Sons, MD  [facility] [address] [employer] [facility] [place] [place], [place] [address] [phone]

## 2021-06-15 LAB — PSA: Prostate Specific Ag, Serum: 14.3 ng/mL — ABNORMAL HIGH (ref 0.0–4.0)

## 2021-06-15 LAB — URINALYSIS, COMPLETE
Bilirubin, UA: NEGATIVE
Glucose, UA: NEGATIVE
Ketones, UA: NEGATIVE
Leukocytes,UA: NEGATIVE
Nitrite, UA: NEGATIVE
Protein,UA: NEGATIVE
Specific Gravity, UA: >1.03 — ABNORMAL HIGH (ref 1.005–1.030)
Urobilinogen, Ur: 0.2 mg/dL (ref 0.2–1.0)
pH, UA: 6 (ref 5.0–7.5)

## 2021-06-15 LAB — MICROSCOPIC EXAMINATION: Bacteria, UA: NONE SEEN

## 2021-06-19 ENCOUNTER — Encounter: Payer: Self-pay | Admitting: Urology

## 2021-06-20 ENCOUNTER — Other Ambulatory Visit: Payer: Self-pay | Admitting: Urology

## 2021-06-20 DIAGNOSIS — R3129 Other microscopic hematuria: Secondary | ICD-10-CM

## 2021-06-21 ENCOUNTER — Telehealth: Payer: Self-pay | Admitting: *Deleted

## 2021-06-21 NOTE — Telephone Encounter (Signed)
Notified patient as instructed,.  

## 2021-06-21 NOTE — Telephone Encounter (Signed)
-----   Message from Abbie Sons, MD sent at 06/20/2021  9:50 PM EDT ----- ?Please let patient know his urinalysis showed microscopic blood and possible infection.  Needs further repeat urine testing for infection.  Schedule lab visit for chlamydia/gonorrhea/trichomonas NAA.  I put in the order.  Please double check.  We will contact patient with results ?

## 2021-06-24 ENCOUNTER — Telehealth: Payer: Self-pay

## 2021-06-24 ENCOUNTER — Ambulatory Visit: Payer: Medicaid Other | Admitting: Gastroenterology

## 2021-06-24 ENCOUNTER — Other Ambulatory Visit: Payer: Self-pay

## 2021-06-24 DIAGNOSIS — D126 Benign neoplasm of colon, unspecified: Secondary | ICD-10-CM

## 2021-06-24 MED ORDER — NA SULFATE-K SULFATE-MG SULF 17.5-3.13-1.6 GM/177ML PO SOLN: 1.0000 | Freq: Once | ORAL | 0 refills | Status: AC

## 2021-06-24 NOTE — Telephone Encounter (Signed)
Called patient about his blood thinner clearance he can stop his xarelto 7 days prior and restart 2 days after per DR CALLWOOD ?

## 2021-06-24 NOTE — Progress Notes (Signed)
Gastroenterology Pre-Procedure Review ? ?Request Date: 08/09/2021 ?Requesting Physician: Dr. Allen Norris ? ?PATIENT REVIEW QUESTIONS: The patient responded to the following health history questions as indicated:   ? ?1. Are you having any GI issues? no ?2. Do you have a personal history of Polyps? yes (hx of polyps ) ?3. Do you have a family history of Colon Cancer or Polyps? no ?4. Diabetes Mellitus? yes (type 2 ) ?5. Joint replacements in the past 12 months?no ?6. Major health problems in the past 3 months?no ?7. Any artificial heart valves, MVP, or defibrillator?no ?   ?MEDICATIONS & ALLERGIES:    ?Patient reports the following regarding taking any anticoagulation/antiplatelet therapy:   ?Plavix, Coumadin, Eliquis, Xarelto, Lovenox, Pradaxa, Brilinta, or Effient? yes (xarelto ) ?Aspirin? no ? ?Patient confirms/reports the following medications:  ?Current Outpatient Medications  ?Medication Sig Dispense Refill  ? diltiazem (CARDIZEM) 120 MG tablet Take by mouth.    ? rivaroxaban (XARELTO) 20 MG TABS tablet Take 1 tablet by mouth daily with breakfast.    ? traMADol (ULTRAM) 50 MG tablet TAKE 1 TABLET BY MOUTH EVERY 8 HOURS AS NEEDED FOR UP TO 5 DAYS.    ? acyclovir (ZOVIRAX) 400 MG tablet Take 400 mg by mouth 2 (two) times daily.    ? Adalimumab 40 MG/0.8ML PSKT Inject 160mg  (4 syringes) week 0, 80mg  (2 syringes) week 2 and 80mg  (1 syringe) week 4 and every week thereafter    ? allopurinol (ZYLOPRIM) 100 MG tablet Take 1 tablet (100 mg total) by mouth daily. 90 tablet 0  ? amiodarone (PACERONE) 200 MG tablet Take by mouth.    ? clindamycin (CLEOCIN T) 1 % lotion Apply topically 2 (two) times daily.    ? diltiazem (CARDIZEM) 120 MG tablet Take 120 mg by mouth 2 (two) times daily.    ? diltiazem (CARDIZEM) 60 MG tablet Take 30 mg by mouth 2 (two) times daily.    ? doxycycline (ADOXA) 100 MG tablet SMARTSIG:1 Tablet(s) By Mouth Every 12 Hours    ? doxycycline (VIBRAMYCIN) 100 MG capsule Take by mouth.    ? glucose blood  (FREESTYLE LITE) test strip Use as instructed 100 each 12  ? hydroxychloroquine (PLAQUENIL) 200 MG tablet Take 200 mg by mouth daily.    ? isoniazid (NYDRAZID) 300 MG tablet Take 300 mg by mouth daily.    ? Lancets (FREESTYLE) lancets Use as instructed 100 each 12  ? losartan-hydrochlorothiazide (HYZAAR) 100-25 MG tablet Take 1 tablet by mouth daily. 90 tablet 1  ? metoprolol succinate (TOPROL-XL) 50 MG 24 hr tablet Take 50 mg by mouth daily.    ? Multiple Vitamin (MULTIVITAMIN WITH MINERALS) TABS tablet Take 1 tablet by mouth daily. Complete Multivitamin    ? oxyCODONE (OXY IR/ROXICODONE) 5 MG immediate release tablet Take 5 mg by mouth 4 (four) times daily as needed.    ? pravastatin (PRAVACHOL) 40 MG tablet TAKE 1 TABLET BY MOUTH EVERY DAY 90 tablet 1  ? pyridOXINE (VITAMIN B-6) 25 MG tablet Take 1 tablet by mouth daily.    ? rivaroxaban (XARELTO) 20 MG TABS tablet Take 1 tablet (20 mg total) by mouth daily. 90 tablet 3  ? Semaglutide, 1 MG/DOSE, (OZEMPIC, 1 MG/DOSE,) 4 MG/3ML SOPN Inject 1 mg into the skin once a week. 9 mL 1  ? sildenafil (VIAGRA) 100 MG tablet Take 0.5-1 tablets (50-100 mg total) by mouth daily as needed for erectile dysfunction. 10 tablet 0  ? SitaGLIPtin-MetFORMIN HCl (JANUMET XR) [phone] MG TB24 Take 1  tablet by mouth daily. 90 tablet 1  ? triamcinolone ointment (KENALOG) 0.1 % Apply topically 2 (two) times daily.    ? valACYclovir (VALTREX) 1000 MG tablet Take 1,000 mg by mouth 3 (three) times daily.    ? ?No current facility-administered medications for this visit.  ? ? ?Patient confirms/reports the following allergies:  ?Allergies  ?Allergen Reactions  ? Eggs Or Egg-Derived Products Shortness Of Breath  ?  Throat swells. ?Patient able to eat products that contain egg just not whole egg alone.  ? Fish Allergy Shortness Of Breath  ?  Throat swells  ? ? ?No orders of the defined types were placed in this encounter. ? ? ?AUTHORIZATION INFORMATION ?Primary Insurance: ?1D#: ?Group  #: ? ?Secondary Insurance: ?1D#: ?Group #: ? ?SCHEDULE INFORMATION: ?Date:08/09/2021  ?Time: ?Location:[facility] ? ?

## 2021-06-25 ENCOUNTER — Other Ambulatory Visit: Payer: Medicare HMO

## 2021-06-25 ENCOUNTER — Other Ambulatory Visit: Payer: Self-pay

## 2021-06-28 LAB — CHLAMYDIA/GONOCOCCUS/TRICHOMONAS, NAA
Chlamydia by NAA: NEGATIVE
Gonococcus by NAA: NEGATIVE
Trich vag by NAA: POSITIVE — AB

## 2021-06-30 ENCOUNTER — Telehealth: Payer: Self-pay | Admitting: Urology

## 2021-06-30 NOTE — Telephone Encounter (Signed)
Her further urine testing was positive for trichomoniasis.  This has been reported to cause inflammation of the prostate and could be a cause of the elevated PSA. ? ?Please send Rx metronidazole 500 mg twice daily x7 days ? ?Schedule repeat urinalysis, NAA trichomonas and PSA in 3 weeks ? ?This is a sexually transmitted infection and partner(s) also need to be tested.  Abstain from intercourse until treatment/testing completed. ?

## 2021-07-01 ENCOUNTER — Other Ambulatory Visit: Payer: Self-pay | Admitting: *Deleted

## 2021-07-01 MED ORDER — METRONIDAZOLE 500 MG PO TABS: 500.0000 mg | ORAL_TABLET | Freq: Two times a day (BID) | ORAL | 0 refills | Status: AC

## 2021-07-01 NOTE — Telephone Encounter (Signed)
Notified patient as instructed, patient pleased. Discussed follow-up appointments, patient agrees  

## 2021-07-06 HISTORY — PX: CARDIAC ELECTROPHYSIOLOGY MAPPING AND ABLATION: SHX1292

## 2021-07-15 ENCOUNTER — Other Ambulatory Visit: Payer: Self-pay

## 2021-07-15 ENCOUNTER — Other Ambulatory Visit: Payer: Medicare HMO

## 2021-07-15 DIAGNOSIS — R3129 Other microscopic hematuria: Secondary | ICD-10-CM

## 2021-07-15 DIAGNOSIS — R972 Elevated prostate specific antigen [PSA]: Secondary | ICD-10-CM

## 2021-07-15 LAB — URINALYSIS, COMPLETE
Bilirubin, UA: NEGATIVE
Glucose, UA: NEGATIVE
Ketones, UA: NEGATIVE
Nitrite, UA: NEGATIVE
Specific Gravity, UA: 1.02 (ref 1.005–1.030)
Urobilinogen, Ur: 0.2 mg/dL (ref 0.2–1.0)
pH, UA: 7 (ref 5.0–7.5)

## 2021-07-15 LAB — MICROSCOPIC EXAMINATION: RBC, Urine: NONE SEEN /hpf (ref 0–2)

## 2021-07-16 LAB — PSA: Prostate Specific Ag, Serum: 15.2 ng/mL — ABNORMAL HIGH (ref 0.0–4.0)

## 2021-07-17 ENCOUNTER — Telehealth: Payer: Self-pay | Admitting: Urology

## 2021-07-17 LAB — CHLAMYDIA/GONOCOCCUS/TRICHOMONAS, NAA
Chlamydia by NAA: NEGATIVE
Gonococcus by NAA: NEGATIVE
Trich vag by NAA: NEGATIVE

## 2021-07-17 NOTE — Telephone Encounter (Signed)
Notified patient as instructed, patient pleased. Discussed follow-up appointments, patient agrees  

## 2021-07-17 NOTE — Telephone Encounter (Signed)
Follow-up urine testing negative for trichomonads.  PSA remains elevated at 18 and urinalysis does show white blood cells.  Recommend lab visit for urine culture ?

## 2021-07-18 LAB — CBC AND DIFFERENTIAL
HCT: 28 — AB (ref 41–53)
Hemoglobin: 9.1 — AB (ref 13.5–17.5)
Platelets: 260 10*3/uL (ref 150–400)
WBC: 13.2

## 2021-07-18 LAB — BASIC METABOLIC PANEL
BUN: 18 (ref 4–21)
CO2: 22 (ref 13–22)
Chloride: 104 (ref 99–108)
Creatinine: 1.7 — AB (ref 0.6–1.3)
Glucose: 122
Potassium: 4.3 mEq/L (ref 3.5–5.1)
Sodium: 132 — AB (ref 137–147)

## 2021-07-18 LAB — COMPREHENSIVE METABOLIC PANEL: eGFR: 45

## 2021-07-24 IMAGING — CR DG HIP (WITH OR WITHOUT PELVIS) 2-3V*R*
4 series · 4 of 4 positions shown · non-contrast
Comparison: None.

CLINICAL DATA: Chronic right hip pain. History of left hip
arthroplasty. No known injury.

EXAM:
DG HIP (WITH OR WITHOUT PELVIS) 2-3V RIGHT

[hip ap]
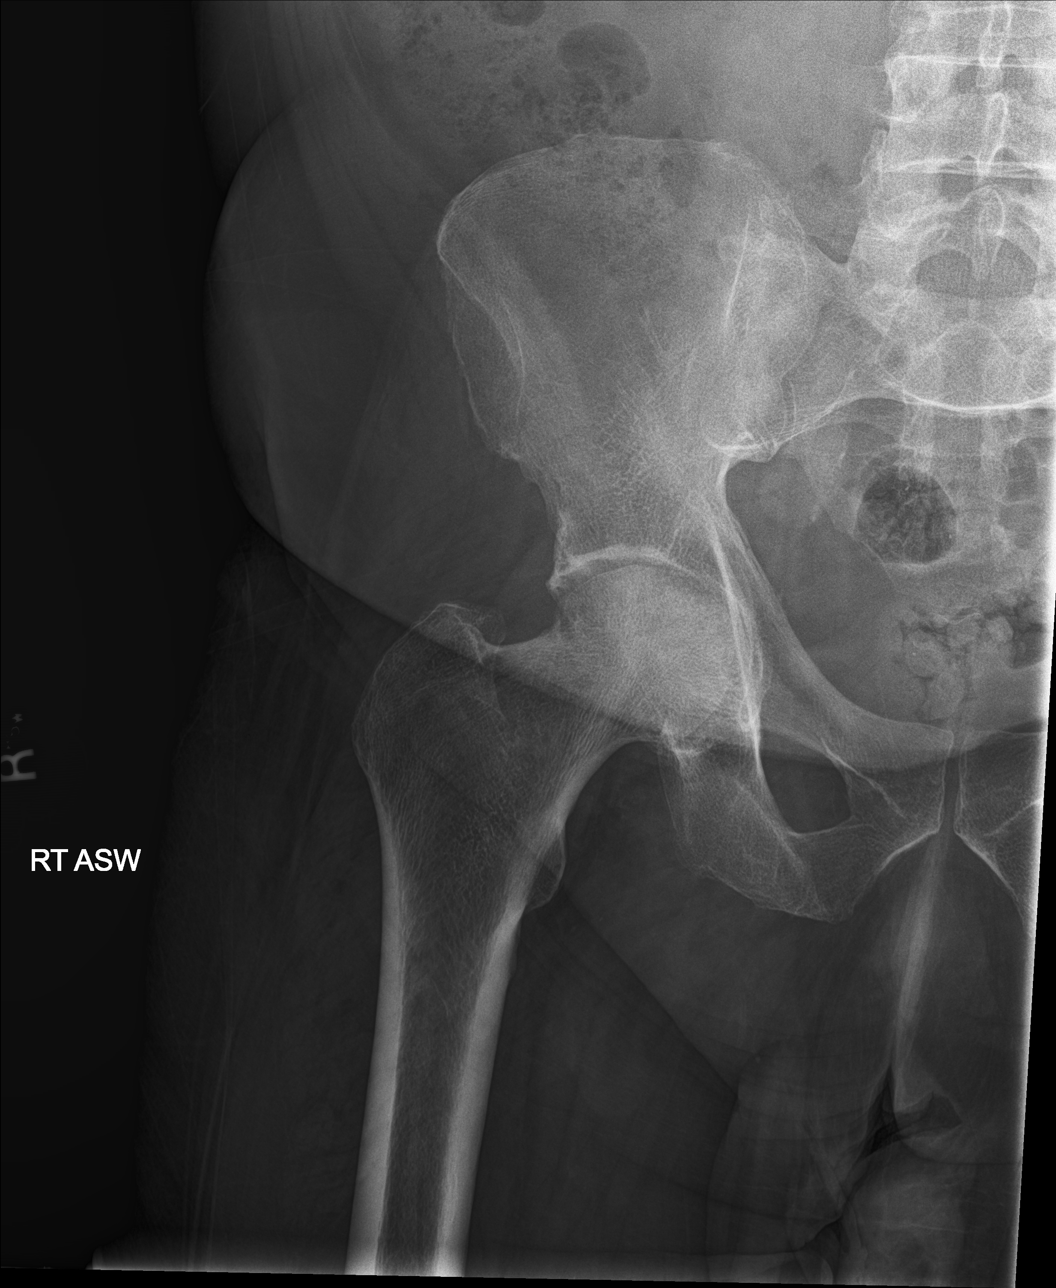

[hip lat]
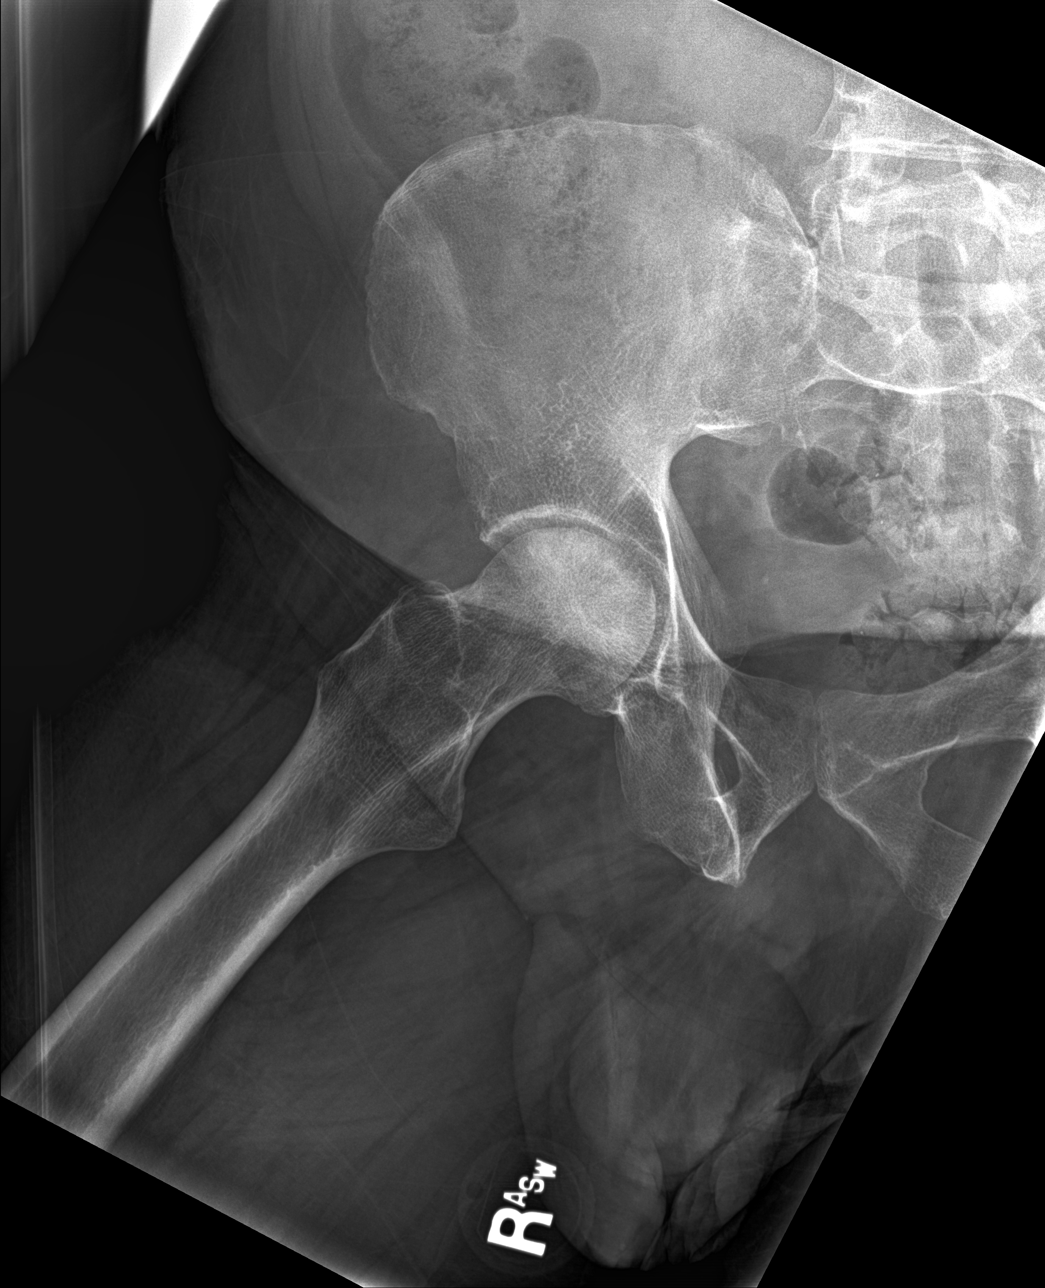

[pelvis ap (1 of 2)]
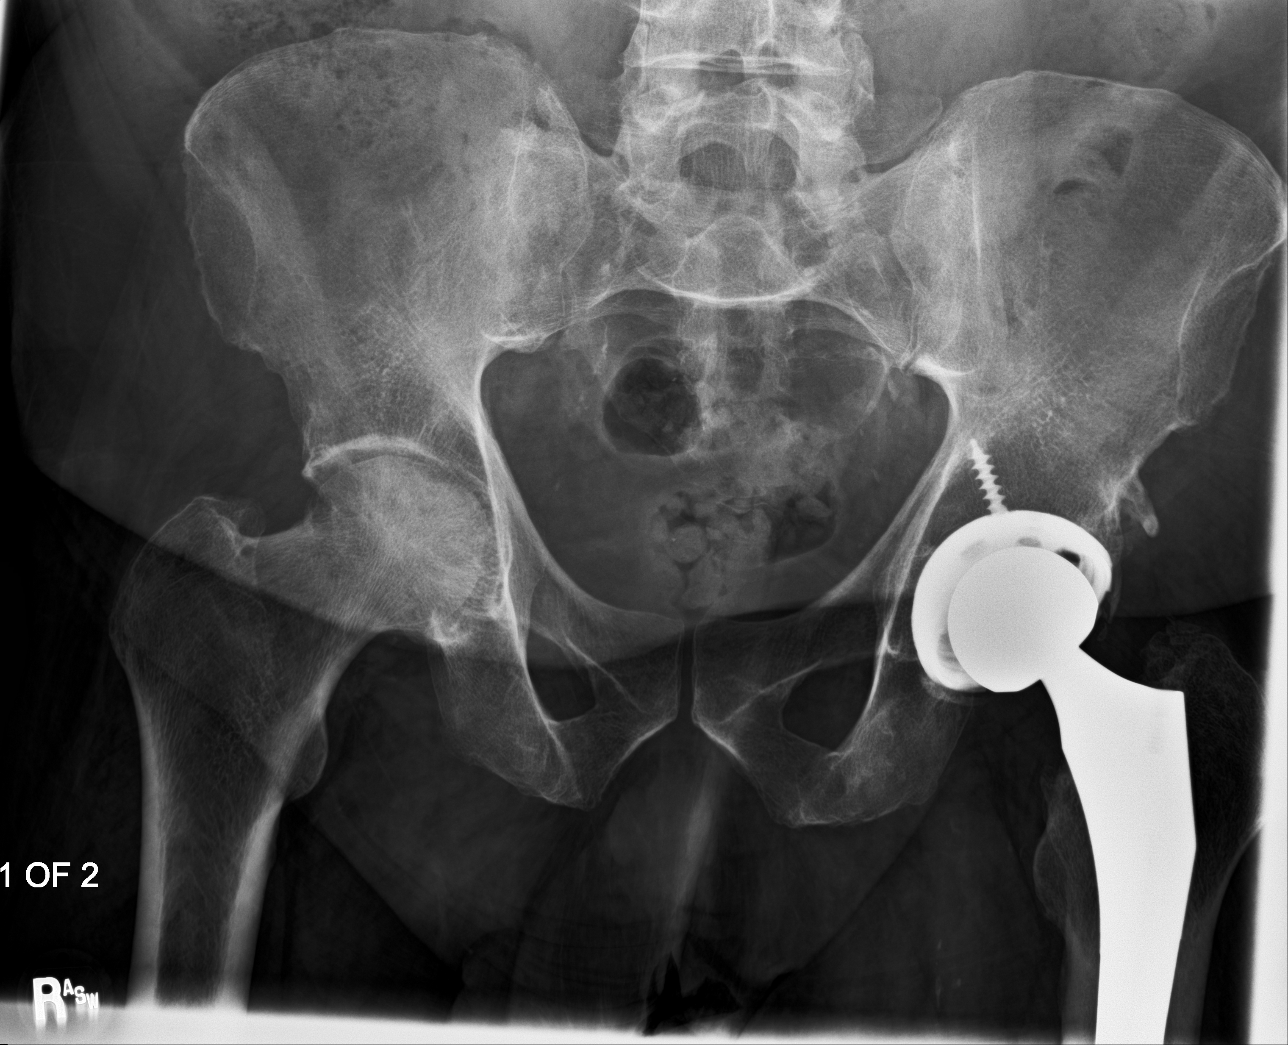

[pelvis ap (2 of 2)]
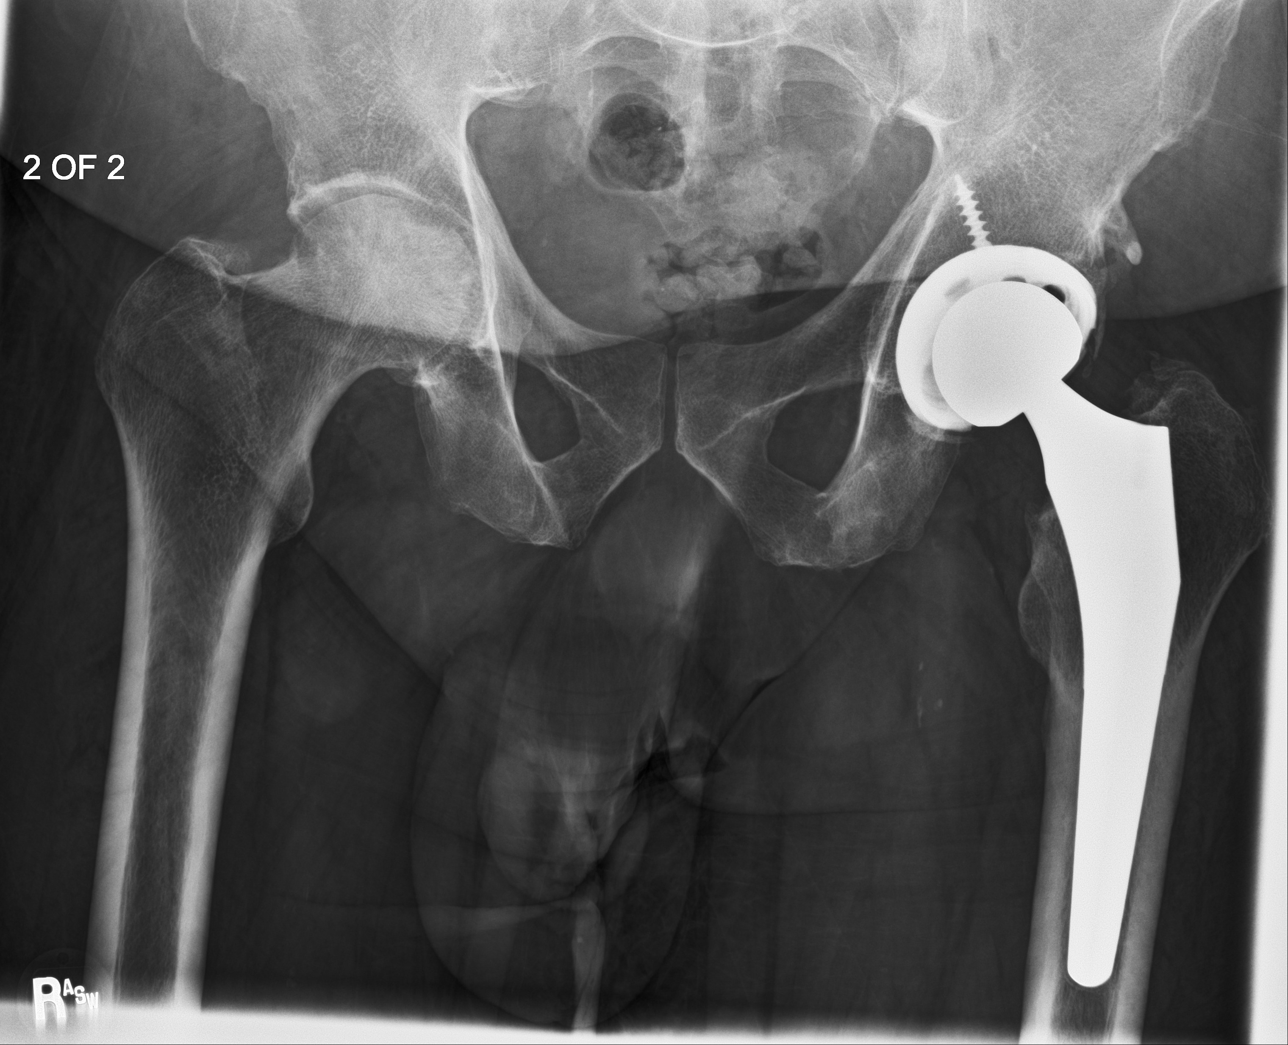

[4 of 4 positions shown; findings below may reference images not displayed]

FINDINGS: Serpiginous subchondral sclerosis of the femoral head consistent
with avascular necrosis. Moderate right hip joint space narrowing.
Moderate lateral acetabular spurring. There is slight acetabular
over coverage. No evidence of fracture, focal bone lesion or bone
destruction. The pubic rami are intact. Pubic symphysis and
sacroiliac joints are congruent. Left hip arthroplasty is in place.
IMPRESSION: 1. Right femoral head avascular necrosis. No radiographic evidence
of subchondral collapse.
2. Moderate right hip osteoarthritis.

## 2021-07-26 ENCOUNTER — Other Ambulatory Visit: Payer: Self-pay | Admitting: Internal Medicine

## 2021-07-26 ENCOUNTER — Encounter: Payer: Self-pay | Admitting: Internal Medicine

## 2021-07-29 ENCOUNTER — Encounter: Payer: Self-pay | Admitting: Internal Medicine

## 2021-07-29 ENCOUNTER — Ambulatory Visit (INDEPENDENT_AMBULATORY_CARE_PROVIDER_SITE_OTHER): Payer: Medicare HMO | Admitting: Internal Medicine

## 2021-07-29 ENCOUNTER — Other Ambulatory Visit: Payer: Self-pay

## 2021-07-29 ENCOUNTER — Other Ambulatory Visit: Payer: Medicare HMO

## 2021-07-29 ENCOUNTER — Telehealth: Payer: Self-pay

## 2021-07-29 VITALS — BP 130/84 | HR 92 | Ht 71.0 in | Wt 300.0 lb

## 2021-07-29 DIAGNOSIS — E118 Type 2 diabetes mellitus with unspecified complications: Secondary | ICD-10-CM

## 2021-07-29 DIAGNOSIS — Z1211 Encounter for screening for malignant neoplasm of colon: Secondary | ICD-10-CM | POA: Diagnosis not present

## 2021-07-29 DIAGNOSIS — I480 Paroxysmal atrial fibrillation: Secondary | ICD-10-CM | POA: Diagnosis not present

## 2021-07-29 DIAGNOSIS — I10 Essential (primary) hypertension: Secondary | ICD-10-CM | POA: Diagnosis not present

## 2021-07-29 DIAGNOSIS — R3129 Other microscopic hematuria: Secondary | ICD-10-CM

## 2021-07-29 NOTE — Progress Notes (Signed)
? ? ?Date:  07/29/2021  ? ?Name:  Jon Mendez   DOB:  1960/04/25   MRN:  [id number] ? ? ?Chief Complaint: Hospitalization Follow-up ? ?Hypertension ?This is a chronic problem. The problem is controlled. Pertinent negatives include no chest pain, headaches, palpitations or shortness of breath. Past treatments include angiotensin blockers and diuretics. There are no compliance problems.  Hypertensive end-organ damage includes CAD/MI (recent cardiac ablation).  ?Diabetes ?He presents for his follow-up diabetic visit. He has type 2 diabetes mellitus. His disease course has been stable. Pertinent negatives for hypoglycemia include no dizziness, headaches or nervousness/anxiousness. Pertinent negatives for diabetes include no chest pain and no fatigue. Current diabetic treatments: Ozempic and Janumet. He is compliant with treatment all of the time. An ACE inhibitor/angiotensin II receptor blocker is being taken.  ?Atrial fibrillation - he recently had a cardiac ablation.  Now off of cardiazem and metoprolol.  He has had 2 episodes of Afib that stopped on its own. ? ?Lab Results  ?Component Value Date  ? NA 138 05/06/2021  ? K 4.8 05/06/2021  ? CO2 22 05/06/2021  ? GLUCOSE 84 05/06/2021  ? BUN 17 05/06/2021  ? CREATININE 1.55 (H) 05/06/2021  ? CALCIUM 9.3 05/06/2021  ? EGFR 51 (L) 05/06/2021  ? GFRNONAA 33 (L) 03/05/2020  ? ?Lab Results  ?Component Value Date  ? CHOL 146 05/06/2021  ? HDL 44 05/06/2021  ? [place] 78 05/06/2021  ? TRIG 133 05/06/2021  ? CHOLHDL 3.3 05/06/2021  ? ?Lab Results  ?Component Value Date  ? TSH 2.430 05/06/2021  ? ?Lab Results  ?Component Value Date  ? HGBA1C 6.8 (H) 05/06/2021  ? ?Lab Results  ?Component Value Date  ? WBC 15.1 (H) 05/06/2021  ? HGB 11.7 (L) 05/06/2021  ? HCT 36.9 (L) 05/06/2021  ? MCV 71 (L) 05/06/2021  ? PLT 447 05/06/2021  ? ?Lab Results  ?Component Value Date  ? ALT 14 05/06/2021  ? AST 24 05/06/2021  ? ALKPHOS 122 (H) 05/06/2021  ? BILITOT 0.4 05/06/2021  ? ?No results  found for: 25OHVITD2, [place], VD25OH  ? ?Review of Systems  ?Constitutional:  Positive for unexpected weight change. Negative for chills and fatigue.  ?HENT:  Negative for trouble swallowing.   ?Respiratory:  Negative for chest tightness, shortness of breath and wheezing.   ?Cardiovascular:  Negative for chest pain, palpitations and leg swelling.  ?Neurological:  Negative for dizziness, light-headedness and headaches.  ?Psychiatric/Behavioral:  Negative for dysphoric mood and sleep disturbance. The patient is not nervous/anxious.   ? ?Patient Active Problem List  ? Diagnosis Date Noted  ? 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  ? Hip pain, chronic, right 10/24/2020  ? Acquired thrombophilia ([place]) 06/20/2020  ? Paroxysmal atrial fibrillation ([place]) 06/17/2019  ? Type II diabetes mellitus with complication ([place]) 03/30/8249  ? Tubular adenoma of colon   ? CKD stage G3a/A2, GFR 45-59 and albumin creatinine ratio 30-299 mg/g ([facility]) 04/25/2015  ? Chronic gouty arthritis 11/22/2014  ? Hyperlipidemia associated with type 2 diabetes mellitus ([place]) 11/22/2014  ? Essential (primary) hypertension 11/22/2014  ? Compulsive tobacco user syndrome 11/22/2014  ? Varicose veins of both lower extremities 11/22/2014  ? Hidradenitis suppurativa 09/06/2012  ? Benign paroxysmal positional nystagmus 07/29/2004  ? ? ?Allergies  ?Allergen Reactions  ? Eggs Or Egg-Derived Products Shortness Of Breath  ?  Throat swells. ?Patient able to eat products that contain egg just not whole  egg alone.  ? Fish Allergy Shortness Of Breath  ?  Throat swells  ? ? ?Past Surgical History:  ?Procedure Laterality Date  ? CARDIAC ELECTROPHYSIOLOGY MAPPING AND ABLATION  07/2021  ? CARDIOVERSION N/A 06/17/2018  ? Procedure: CARDIOVERSION (CATH LAB);  Surgeon: Minna Merritts, MD;  Location: [facility];  Service: Cardiovascular;  Laterality: N/A;  ? COLONOSCOPY WITH  PROPOFOL N/A 06/04/2015  ? Procedure: COLONOSCOPY WITH PROPOFOL;  Surgeon: Lucilla Lame, MD;  5 tubular adenomas  ? HIP ARTHROPLASTY Left 2013  ? LEG SKIN LESION  BIOPSY / EXCISION Right 2015  ? Hidradenitis  ? POLYPECTOMY  06/04/2015  ? Procedure: POLYPECTOMY;  Surgeon: Lucilla Lame, MD;  Location: [facility];  Service: Endoscopy;;  ? VARICOSE VEIN SURGERY Bilateral 2015  ? ? ?Social History  ? ?Tobacco Use  ? Smoking status: Every Day  ?  Packs/day: 0.25  ?  Years: 30.00  ?  Pack years: 7.50  ?  Types: Cigarettes  ? Smokeless tobacco: Never  ?Substance Use Topics  ? Alcohol use: No  ?  Alcohol/week: 4.0 standard drinks  ?  Types: 4 Standard drinks or equivalent per week  ? Drug use: No  ? ? ? ?Medication list has been reviewed and updated. ? ?Current Meds  ?Medication Sig  ? acyclovir (ZOVIRAX) 400 MG tablet Take 400 mg by mouth 2 (two) times daily.  ? Adalimumab 40 MG/0.8ML PSKT Inject $RemoveBef'160mg'TkeKyZFFhG$  (4 syringes) week 0, $Remov'80mg'LRGmRA$  (2 syringes) week 2 and $Rem'80mg'KDMp$  (1 syringe) week 4 and every week thereafter  ? allopurinol (ZYLOPRIM) 100 MG tablet Take 1 tablet (100 mg total) by mouth daily.  ? amiodarone (PACERONE) 200 MG tablet Take by mouth.  ? clindamycin (CLEOCIN T) 1 % lotion Apply topically 2 (two) times daily.  ? colchicine 0.6 MG tablet Take 0.3 mg by mouth daily.  ? doxycycline (ADOXA) 100 MG tablet SMARTSIG:1 Tablet(s) By Mouth Every 12 Hours  ? glucose blood (FREESTYLE LITE) test strip Use as instructed  ? hydroxychloroquine (PLAQUENIL) 200 MG tablet Take 200 mg by mouth daily.  ? isoniazid (NYDRAZID) 300 MG tablet Take 300 mg by mouth daily.  ? Lancets (FREESTYLE) lancets Use as instructed  ? losartan-hydrochlorothiazide (HYZAAR) 50-12.5 MG tablet Take 1 tablet by mouth daily.  ? Multiple Vitamin (MULTIVITAMIN WITH MINERALS) TABS tablet Take 1 tablet by mouth daily. Complete Multivitamin  ? oxyCODONE (OXY IR/ROXICODONE) 5 MG immediate release tablet Take 5 mg by mouth 4 (four) times daily as needed.  ?  pantoprazole (PROTONIX) 20 MG tablet Take by mouth.  ? pravastatin (PRAVACHOL) 40 MG tablet TAKE 1 TABLET BY MOUTH EVERY DAY  ? pyridOXINE (VITAMIN B-6) 25 MG tablet Take 1 tablet by mouth daily.  ? rivaroxaban (XARELTO) 20 MG TABS tablet Take 1 tablet by mouth daily with breakfast.  ? Semaglutide, 1 MG/DOSE, (OZEMPIC, 1 MG/DOSE,) 4 MG/3ML SOPN Inject 1 mg into the skin once a week.  ? sildenafil (VIAGRA) 100 MG tablet Take 0.5-1 tablets (50-100 mg total) by mouth daily as needed for erectile dysfunction.  ? SitaGLIPtin-MetFORMIN HCl (JANUMET XR) [phone] MG TB24 Take 1 tablet by mouth daily.  ? sucralfate (CARAFATE) 1 g tablet Take 1 g by mouth 5 (five) times daily.  ? traMADol (ULTRAM) 50 MG tablet TAKE 1 TABLET BY MOUTH EVERY 8 HOURS AS NEEDED FOR UP TO 5 DAYS.  ? triamcinolone ointment (KENALOG) 0.1 % Apply topically 2 (two) times daily.  ? valACYclovir (VALTREX) 1000 MG tablet Take 1,000 mg by  mouth 3 (three) times daily.  ? ? ? ?  05/06/2021  ? 11:03 AM 12/04/2020  ?  1:39 PM 10/24/2020  ?  2:19 PM 06/20/2020  ?  9:24 AM  ?GAD 7 : Generalized Anxiety Score  ?Nervous, Anxious, on Edge 0 0 0 0  ?Control/stop worrying 0 0 0 0  ?Worry too much - different things 0 0 0 0  ?Trouble relaxing 0 0 0 0  ?Restless 0 0 0 0  ?Easily annoyed or irritable 0 0 0 0  ?Afraid - awful might happen 0 0 0 0  ?Total GAD 7 Score 0 0 0 0  ?Anxiety Difficulty   Not difficult at all   ? ? ? ?  05/06/2021  ? 11:03 AM  ?Depression screen PHQ 2/9  ?Decreased Interest 0  ?Down, Depressed, Hopeless 0  ?PHQ - 2 Score 0  ?Altered sleeping 0  ?Tired, decreased energy 0  ?Change in appetite 0  ?Feeling bad or failure about yourself  0  ?Trouble concentrating 0  ?Moving slowly or fidgety/restless 0  ?Suicidal thoughts 0  ?PHQ-9 Score 0  ?Difficult doing work/chores Not difficult at all  ? ? ?BP Readings from Last 3 Encounters:  ?07/29/21 130/84  ?06/14/21 127/82  ?05/06/21 126/84  ? ? ?Physical Exam ?Vitals and nursing note reviewed.  ?Constitutional:    ?   General: He is not in acute distress. ?   Appearance: He is well-developed. He is obese.  ?HENT:  ?   Head: Normocephalic and atraumatic.  ?Cardiovascular:  ?   Rate and Rhythm: Normal rate and regular r

## 2021-07-29 NOTE — Patient Instructions (Signed)
Team Member Role and Programme researcher, broadcasting/film/video Address Start End Comments  ?Lucilla Lame, MD Consulting Physician (Gastroenterology) Phone: [phone] Fax: [phone]  [address] [place] [address] 07/29/2021 - -  ? ?

## 2021-07-29 NOTE — Telephone Encounter (Signed)
Left vm with Ms. White for pt to return my call to reschedule colonoscopy due to recent cardiac ablation and pt being on blood thinners.  ?

## 2021-08-01 ENCOUNTER — Telehealth: Payer: Self-pay | Admitting: *Deleted

## 2021-08-01 LAB — CULTURE, URINE COMPREHENSIVE

## 2021-08-01 MED ORDER — SULFAMETHOXAZOLE-TRIMETHOPRIM 800-160 MG PO TABS: 1.0000 | ORAL_TABLET | Freq: Two times a day (BID) | ORAL | 0 refills | Status: AC

## 2021-08-01 NOTE — Telephone Encounter (Signed)
[facility] and left a message for him to call the office back  ?

## 2021-08-06 ENCOUNTER — Telehealth: Payer: Self-pay | Admitting: Urology

## 2021-08-06 ENCOUNTER — Other Ambulatory Visit: Payer: Self-pay | Admitting: *Deleted

## 2021-08-06 DIAGNOSIS — R3129 Other microscopic hematuria: Secondary | ICD-10-CM

## 2021-08-06 DIAGNOSIS — N200 Calculus of kidney: Secondary | ICD-10-CM

## 2021-08-06 MED ORDER — SULFAMETHOXAZOLE-TRIMETHOPRIM 800-160 MG PO TABS: 1.0000 | ORAL_TABLET | Freq: Two times a day (BID) | ORAL | 0 refills | Status: AC

## 2021-08-06 NOTE — Telephone Encounter (Signed)
Notified patient as instructed, patient pleased. Discussed follow-up appointments, patient agrees  

## 2021-08-06 NOTE — Telephone Encounter (Signed)
Urine culture was positive.  This may be a source of his elevated PSA.  Please send Rx Septra DS 1 twice daily x3 weeks.  Schedule a follow-up appointment/KUB with me in 1 month for repeat UA and possible repeat PSA ?

## 2021-08-09 ENCOUNTER — Ambulatory Visit: Admit: 2021-08-09 | Payer: Medicare HMO | Admitting: Gastroenterology

## 2021-08-09 ENCOUNTER — Other Ambulatory Visit: Payer: Self-pay

## 2021-08-09 DIAGNOSIS — E118 Type 2 diabetes mellitus with unspecified complications: Secondary | ICD-10-CM

## 2021-08-09 SURGERY — COLONOSCOPY WITH PROPOFOL
Anesthesia: Choice

## 2021-08-09 MED ORDER — JANUMET XR 100-1000 MG PO TB24: 1.0000 | ORAL_TABLET | Freq: Every day | ORAL | 1 refills | Status: DC

## 2021-08-13 ENCOUNTER — Other Ambulatory Visit: Payer: Self-pay | Admitting: Internal Medicine

## 2021-08-13 ENCOUNTER — Telehealth: Payer: Self-pay | Admitting: Internal Medicine

## 2021-08-13 DIAGNOSIS — E118 Type 2 diabetes mellitus with unspecified complications: Secondary | ICD-10-CM

## 2021-08-13 MED ORDER — METFORMIN HCL ER 500 MG PO TB24: 1000.0000 mg | ORAL_TABLET | Freq: Every day | ORAL | 0 refills | Status: DC

## 2021-08-13 NOTE — Telephone Encounter (Signed)
Cost was $179 for 30 day supply of SitaGLIPtin-MetFORMIN HCl (JANUMET XR) [phone] MG TB24/ pt called to get an alternative for a lower cost this week / please advise and send to [facility] [place] / pt was advise to let Dr. Army Melia know if the cost was still high  ?

## 2021-08-14 NOTE — Telephone Encounter (Signed)
Called and left a detailed msg informing pt of Dr Gaspar Cola note. Told him to be sure to continue Ozempic and take metformin 2 tabs daily. ?

## 2021-08-26 ENCOUNTER — Telehealth: Payer: Self-pay | Admitting: Internal Medicine

## 2021-08-26 NOTE — Telephone Encounter (Signed)
Copied from [place] [phone]. Topic: General - Other >> Aug 26, 2021 11:51 AM Antonieta Iba C wrote: Reason for CRM: pt called in to follow up. Pt says in his last ov he and provider discussed his sleep apnea. Pt says that he was suppose to get a machine. Pt would like a call back to discuss this further.

## 2021-08-30 DIAGNOSIS — Z9889 Other specified postprocedural states: Secondary | ICD-10-CM | POA: Insufficient documentation

## 2021-09-04 ENCOUNTER — Ambulatory Visit (INDEPENDENT_AMBULATORY_CARE_PROVIDER_SITE_OTHER): Payer: Medicare HMO | Admitting: Internal Medicine

## 2021-09-04 ENCOUNTER — Encounter: Payer: Self-pay | Admitting: Internal Medicine

## 2021-09-04 VITALS — BP 138/72 | HR 82 | Ht 71.0 in | Wt 281.4 lb

## 2021-09-04 DIAGNOSIS — I480 Paroxysmal atrial fibrillation: Secondary | ICD-10-CM | POA: Diagnosis not present

## 2021-09-04 DIAGNOSIS — E118 Type 2 diabetes mellitus with unspecified complications: Secondary | ICD-10-CM

## 2021-09-04 DIAGNOSIS — G4730 Sleep apnea, unspecified: Secondary | ICD-10-CM

## 2021-09-04 DIAGNOSIS — D126 Benign neoplasm of colon, unspecified: Secondary | ICD-10-CM | POA: Diagnosis not present

## 2021-09-04 DIAGNOSIS — I10 Essential (primary) hypertension: Secondary | ICD-10-CM | POA: Diagnosis not present

## 2021-09-04 MED ORDER — OZEMPIC (1 MG/DOSE) 4 MG/3ML ~~LOC~~ SOPN: 1.0000 mg | PEN_INJECTOR | SUBCUTANEOUS | 1 refills | Status: DC

## 2021-09-04 NOTE — Progress Notes (Signed)
Date:  09/04/2021   Name:  Jon Mendez   DOB:  November 03, 1960   MRN:  [id number]   Chief Complaint: Diabetes and Hypertension  Hypertension This is a chronic problem. Pertinent negatives include no chest pain, headaches, palpitations or shortness of breath. Past treatments include diuretics, angiotensin blockers and calcium channel blockers. The current treatment provides significant improvement. Hypertensive end-organ damage includes kidney disease and CAD/MI (afib s/p ablation).  Diabetes He presents for his follow-up diabetic visit. He has type 2 diabetes mellitus. Pertinent negatives for hypoglycemia include no headaches or tremors. Pertinent negatives for diabetes include no chest pain, no fatigue, no polydipsia and no polyuria. Current diabetic treatment includes oral agent (monotherapy) (metformin and ozempic (januvia was stopped due to cost)).  Afib - s/p ablation; cardiazem and metoprolol daily discontinued.  He was seen recently by cardiology who continued amiodarone and restarted  diltiazem 240 mg.  He was instructed to use metoprolol PRN. Sleep disorder - he had discussed a sleep study with cardiology before his ablation.  He is ready to proceed but they have not called him.  His wife has witnessed apnea and he snores.  His Epworth score is 7.  Lab Results  Component Value Date   NA 132 (A) 07/18/2021   K 4.3 07/18/2021   CO2 22 07/18/2021   GLUCOSE 84 05/06/2021   BUN 18 07/18/2021   CREATININE 1.7 (A) 07/18/2021   CALCIUM 9.3 05/06/2021   EGFR 45 07/18/2021   GFRNONAA 33 (L) 03/05/2020   Lab Results  Component Value Date   CHOL 146 05/06/2021   HDL 44 05/06/2021   LDLCALC 78 05/06/2021   TRIG 133 05/06/2021   CHOLHDL 3.3 05/06/2021   Lab Results  Component Value Date   TSH 2.430 05/06/2021   Lab Results  Component Value Date   HGBA1C 6.8 (H) 05/06/2021   Lab Results  Component Value Date   WBC 13.2 07/18/2021   HGB 9.1 (A) 07/18/2021   HCT 28 (A)  07/18/2021   MCV 71 (L) 05/06/2021   PLT 260 07/18/2021   Lab Results  Component Value Date   ALT 14 05/06/2021   AST 24 05/06/2021   ALKPHOS 122 (H) 05/06/2021   BILITOT 0.4 05/06/2021   No results found for: 25OHVITD2, 25OHVITD3, VD25OH   Review of Systems  Constitutional:  Negative for appetite change, fatigue and unexpected weight change.  Eyes:  Negative for visual disturbance.  Respiratory:  Negative for cough, shortness of breath and wheezing.   Cardiovascular:  Negative for chest pain, palpitations and leg swelling.  Gastrointestinal:  Negative for abdominal pain and blood in stool.  Endocrine: Negative for polydipsia and polyuria.  Genitourinary:  Negative for dysuria and hematuria.  Skin:  Negative for color change and rash.  Neurological:  Negative for tremors, numbness and headaches.  Psychiatric/Behavioral:  Negative for dysphoric mood.    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   Hip pain, chronic, right 10/24/2020   Acquired thrombophilia ([place]) 06/20/2020   Paroxysmal atrial fibrillation ([place]) 06/17/2019   Type II diabetes mellitus with complication ([place]) 03/54/6568   Tubular adenoma of colon    CKD stage G3a/A2, GFR 45-59 and albumin creatinine ratio 30-299 mg/g (HCC) 04/25/2015   Chronic gouty arthritis 11/22/2014   Hyperlipidemia associated with type 2 diabetes mellitus (  [place]) 11/22/2014   Essential (primary) hypertension 11/22/2014   Compulsive tobacco user syndrome 11/22/2014   Varicose veins of both lower extremities 11/22/2014   Hidradenitis suppurativa 09/06/2012   Benign paroxysmal positional nystagmus 07/29/2004    Allergies  Allergen Reactions   Eggs Or Egg-Derived Products Shortness Of Breath    Throat swells. Patient able to eat products that  contain egg just not whole egg alone.   Fish Allergy Shortness Of Breath    Throat swells    Past Surgical History:  Procedure Laterality Date   CARDIAC ELECTROPHYSIOLOGY MAPPING AND ABLATION  07/2021   CARDIOVERSION N/A 06/17/2018   Procedure: CARDIOVERSION (CATH LAB);  Surgeon: Minna Merritts, MD;  Location: [facility] ORS;  Service: Cardiovascular;  Laterality: N/A;   COLONOSCOPY WITH PROPOFOL N/A 06/04/2015   Procedure: COLONOSCOPY WITH PROPOFOL;  Surgeon: Lucilla Lame, MD;  5 tubular adenomas   HIP ARTHROPLASTY Left 2013   LEG SKIN LESION  BIOPSY / EXCISION Right 2015   Hidradenitis   POLYPECTOMY  06/04/2015   Procedure: POLYPECTOMY;  Surgeon: Lucilla Lame, MD;  Location: [facility];  Service: Endoscopy;;   VARICOSE VEIN SURGERY Bilateral 2015    Social History   Tobacco Use   Smoking status: Every Day    Packs/day: 0.25    Years: 30.00    Pack years: 7.50    Types: Cigarettes   Smokeless tobacco: Never  Substance Use Topics   Alcohol use: No    Alcohol/week: 4.0 standard drinks    Types: 4 Standard drinks or equivalent per week   Drug use: No     Medication list has been reviewed and updated.  Current Meds  Medication Sig   acyclovir (ZOVIRAX) 400 MG tablet Take 400 mg by mouth 2 (two) times daily.   Adalimumab 40 MG/0.8ML PSKT Inject 122m (4 syringes) week 0, 815m(2 syringes) week 2 and 8071m1 syringe) week 4 and every week thereafter   allopurinol (ZYLOPRIM) 100 MG tablet Take 1 tablet (100 mg total) by mouth daily.   amiodarone (PACERONE) 200 MG tablet Take by mouth.   clindamycin (CLEOCIN T) 1 % lotion Apply topically 2 (two) times daily.   colchicine 0.6 MG tablet Take 0.3 mg by mouth daily.   diltiazem (CARDIZEM) 120 MG tablet Take 120 mg by mouth 2 (two) times daily.   glucose blood (FREESTYLE LITE) test strip Use as instructed   hydroxychloroquine (PLAQUENIL) 200 MG tablet Take 200 mg by mouth daily.   isoniazid (NYDRAZID) 300 MG tablet Take 300  mg by mouth daily.   Lancets (FREESTYLE) lancets Use as instructed   losartan-hydrochlorothiazide (HYZAAR) 50-12.5 MG tablet Take 1 tablet by mouth daily.   metFORMIN (GLUCOPHAGE-XR) 500 MG 24 hr tablet Take 2 tablets (1,000 mg total) by mouth daily with breakfast.   metoprolol succinate (TOPROL-XL) 100 MG 24 hr tablet Take by mouth daily as needed.   Multiple Vitamin (MULTIVITAMIN WITH MINERALS) TABS tablet Take 1 tablet by mouth daily. Complete Multivitamin   oxyCODONE (OXY IR/ROXICODONE) 5 MG immediate release tablet Take 5 mg by mouth 4 (four) times daily as needed.   pravastatin (PRAVACHOL) 40 MG tablet TAKE 1 TABLET BY MOUTH EVERY DAY   pyridOXINE (VITAMIN B-6) 25 MG tablet Take 1 tablet by mouth daily.   rivaroxaban (XARELTO) 20 MG TABS tablet Take 1 tablet by mouth daily with breakfast.   Semaglutide, 1 MG/DOSE, (OZEMPIC, 1 MG/DOSE,) 4 MG/3ML SOPN Inject 1 mg into the skin once a week.  sildenafil (VIAGRA) 100 MG tablet Take 0.5-1 tablets (50-100 mg total) by mouth daily as needed for erectile dysfunction.   sucralfate (CARAFATE) 1 g tablet Take 1 g by mouth 5 (five) times daily.   traMADol (ULTRAM) 50 MG tablet TAKE 1 TABLET BY MOUTH EVERY 8 HOURS AS NEEDED FOR UP TO 5 DAYS.   triamcinolone ointment (KENALOG) 0.1 % Apply topically 2 (two) times daily.   valACYclovir (VALTREX) 1000 MG tablet Take 1,000 mg by mouth 3 (three) times daily.       09/04/2021    9:03 AM 05/06/2021   11:03 AM 12/04/2020    1:39 PM 10/24/2020    2:19 PM  GAD 7 : Generalized Anxiety Score  Nervous, Anxious, on Edge 0 0 0 0  Control/stop worrying 0 0 0 0  Worry too much - different things 0 0 0 0  Trouble relaxing 0 0 0 0  Restless 0 0 0 0  Easily annoyed or irritable 0 0 0 0  Afraid - awful might happen 0 0 0 0  Total GAD 7 Score 0 0 0 0  Anxiety Difficulty Not difficult at all   Not difficult at all       09/04/2021    9:02 AM  Depression screen PHQ 2/9  Decreased Interest 0  Down, Depressed,  Hopeless 0  PHQ - 2 Score 0  Altered sleeping 0  Tired, decreased energy 0  Change in appetite 0  Feeling bad or failure about yourself  0  Trouble concentrating 0  Moving slowly or fidgety/restless 0  Suicidal thoughts 0  PHQ-9 Score 0  Difficult doing work/chores Not difficult at all    BP Readings from Last 3 Encounters:  09/04/21 138/72  07/29/21 130/84  06/14/21 127/82    Physical Exam Vitals and nursing note reviewed.  Constitutional:      General: He is not in acute distress.    Appearance: Normal appearance. He is well-developed.  HENT:     Head: Normocephalic and atraumatic.  Cardiovascular:     Rate and Rhythm: Normal rate and regular rhythm.     Pulses: Normal pulses.     Heart sounds: No murmur heard. Pulmonary:     Effort: Pulmonary effort is normal. No respiratory distress.     Breath sounds: No wheezing or rhonchi.  Musculoskeletal:     Cervical back: Normal range of motion.     Right lower leg: No edema.     Left lower leg: No edema.  Lymphadenopathy:     Cervical: No cervical adenopathy.  Skin:    General: Skin is warm and dry.     Capillary Refill: Capillary refill takes less than 2 seconds.     Findings: No rash.  Neurological:     Mental Status: He is alert and oriented to person, place, and time.  Psychiatric:        Mood and Affect: Mood normal.        Behavior: Behavior normal.    Wt Readings from Last 3 Encounters:  09/04/21 281 lb 6.4 oz (127.6 kg)  07/29/21 300 lb (136.1 kg)  06/14/21 288 lb (130.6 kg)    BP 138/72   Pulse 82   Ht _0  (1.803 m)   Wt 281 lb 6.4 oz (127.6 kg)   SpO2 99%   BMI 39.25 kg/m   Assessment and Plan: 1. Essential (primary) hypertension Clinically stable exam with well controlled BP. Tolerating medications without side effects at this time. Pt  to continue current regimen and low sodium diet; benefits of regular exercise as able discussed. He is back on diltiazem bid. - Comprehensive metabolic  panel - CBC with Differential/Platelet  2. Type II diabetes mellitus with complication (HCC) Clinically stable by exam and report without s/s of hypoglycemia. DM complicated by hypertension and dyslipidemia. Tolerating medications well without side effects or other concerns. Now off of Januvia - on metformin and Ozempic. He has lost 20 lbs. - Comprehensive metabolic panel - Hemoglobin A1c - Semaglutide, 1 MG/DOSE, (OZEMPIC, 1 MG/DOSE,) 4 MG/3ML SOPN; Inject 1 mg into the skin once a week.  Dispense: 9 mL; Refill: 1  3. Paroxysmal atrial fibrillation (HCC) S/p ablation with only a few brief episodes that responded to PRN metoprolol  4. Tubular adenoma of colon Due for repeat colonoscopy He will call GI Dr. Allen Norris to schedule  5. Sleep-disordered breathing Refer to [facility] diagnostics for home sleep study   Partially dictated using Editor, commissioning. Any errors are unintentional.  Halina Maidens, MD [facility]  09/04/2021

## 2021-09-05 ENCOUNTER — Ambulatory Visit
Admission: RE | Admit: 2021-09-05 | Discharge: 2021-09-05 | Disposition: A | Discharge status: Home / Self Care | Payer: Medicare HMO | Source: Ambulatory Visit | Attending: Urology | Admitting: Urology

## 2021-09-05 ENCOUNTER — Encounter: Payer: Self-pay | Admitting: Urology

## 2021-09-05 ENCOUNTER — Ambulatory Visit: Payer: Medicare HMO | Admitting: Urology

## 2021-09-05 VITALS — BP 113/74 | HR 101 | Ht 72.0 in | Wt 281.0 lb

## 2021-09-05 DIAGNOSIS — N200 Calculus of kidney: Secondary | ICD-10-CM | POA: Insufficient documentation

## 2021-09-05 DIAGNOSIS — R972 Elevated prostate specific antigen [PSA]: Secondary | ICD-10-CM

## 2021-09-05 DIAGNOSIS — R8281 Pyuria: Secondary | ICD-10-CM | POA: Diagnosis not present

## 2021-09-05 LAB — CBC WITH DIFFERENTIAL/PLATELET
Basophils Absolute: 0.1 10*3/uL (ref 0.0–0.2)
Basos: 1 %
EOS (ABSOLUTE): 0.2 10*3/uL (ref 0.0–0.4)
Eos: 2 %
Hematocrit: 38.6 % (ref 37.5–51.0)
Hemoglobin: 12.2 g/dL — ABNORMAL LOW (ref 13.0–17.7)
Immature Grans (Abs): 0 10*3/uL (ref 0.0–0.1)
Immature Granulocytes: 0 %
Lymphocytes Absolute: 3.6 10*3/uL — ABNORMAL HIGH (ref 0.7–3.1)
Lymphs: 30 %
MCH: 22.3 pg — ABNORMAL LOW (ref 26.6–33.0)
MCHC: 31.6 g/dL (ref 31.5–35.7)
MCV: 70 fL — ABNORMAL LOW (ref 79–97)
Monocytes Absolute: 0.8 10*3/uL (ref 0.1–0.9)
Monocytes: 6 %
Neutrophils Absolute: 7.3 10*3/uL — ABNORMAL HIGH (ref 1.4–7.0)
Neutrophils: 61 %
Platelets: 319 10*3/uL (ref 150–450)
RBC: 5.48 x10E6/uL (ref 4.14–5.80)
RDW: 17.5 % — ABNORMAL HIGH (ref 11.6–15.4)
WBC: 12.1 10*3/uL — ABNORMAL HIGH (ref 3.4–10.8)

## 2021-09-05 LAB — COMPREHENSIVE METABOLIC PANEL
ALT: 16 IU/L (ref 0–44)
AST: 18 IU/L (ref 0–40)
Albumin/Globulin Ratio: 1 — ABNORMAL LOW (ref 1.2–2.2)
Albumin: 4 g/dL (ref 3.8–4.9)
Alkaline Phosphatase: 96 IU/L (ref 44–121)
BUN/Creatinine Ratio: 10 (ref 10–24)
BUN: 16 mg/dL (ref 8–27)
Bilirubin Total: 0.3 mg/dL (ref 0.0–1.2)
CO2: 23 mmol/L (ref 20–29)
Calcium: 9.5 mg/dL (ref 8.6–10.2)
Chloride: 99 mmol/L (ref 96–106)
Creatinine, Ser: 1.65 mg/dL — ABNORMAL HIGH (ref 0.76–1.27)
Globulin, Total: 3.9 g/dL (ref 1.5–4.5)
Glucose: 91 mg/dL (ref 70–99)
Potassium: 4 mmol/L (ref 3.5–5.2)
Sodium: 136 mmol/L (ref 134–144)
Total Protein: 7.9 g/dL (ref 6.0–8.5)
eGFR: 47 mL/min/{1.73_m2} — ABNORMAL LOW (ref >59)

## 2021-09-05 LAB — HEMOGLOBIN A1C
Est. average glucose Bld gHb Est-mCnc: 143 mg/dL
Hgb A1c MFr Bld: 6.6 % — ABNORMAL HIGH (ref 4.8–5.6)

## 2021-09-05 NOTE — Progress Notes (Signed)
09/05/2021 3:23 PM   Jon Mendez 08/07/1960 [id number]  Referring provider: Glean Hess, MD [address] [place],  [place] [address]  Chief Complaint  Patient presents with   Nephrolithiasis    HPI: Jon Mendez is a 61 y.o. male who presents for evaluation of an elevated PSA.  Initially seen 06/14/2021 for a PSA of 18.2 drawn January 2023 PSA 2 years prior was 2.1 Repeat PSA 06/14/21 was 14.3 and 07/15/2021 15.2 He did have pyuria.  Urinalysis initially positive for trichomonads which was treated with follow-up testing negative.  Last UA with pyuria grew Proteus and he completed a course of antibiotics Currently has no voiding complaints.   PMH: Past Medical History:  Diagnosis Date   CKD (chronic kidney disease), stage III ([place])    Diabetes mellitus without complication ([place])    H/O hidradenitis suppurativa    H/O varicose veins    High cholesterol    Hypertension    Vertigo     Surgical History: Past Surgical History:  Procedure Laterality Date   CARDIAC ELECTROPHYSIOLOGY MAPPING AND ABLATION  07/2021   CARDIOVERSION N/A 06/17/2018   Procedure: CARDIOVERSION (CATH LAB);  Surgeon: Minna Merritts, MD;  Location: [facility] ORS;  Service: Cardiovascular;  Laterality: N/A;   COLONOSCOPY WITH PROPOFOL N/A 06/04/2015   Procedure: COLONOSCOPY WITH PROPOFOL;  Surgeon: Lucilla Lame, MD;  5 tubular adenomas   HIP ARTHROPLASTY Left 2013   LEG SKIN LESION  BIOPSY / EXCISION Right 2015   Hidradenitis   POLYPECTOMY  06/04/2015   Procedure: POLYPECTOMY;  Surgeon: Lucilla Lame, MD;  Location: [facility];  Service: Endoscopy;;   VARICOSE VEIN SURGERY Bilateral 2015    Home Medications:  Allergies as of 09/05/2021       Reactions   Eggs Or Egg-derived Products Shortness Of Breath   Throat swells. Patient able to eat products that contain egg just not whole egg alone.   Fish Allergy Shortness Of Breath   Throat swells        Medication  List        Accurate as of September 05, 2021  3:23 PM. If you have any questions, ask your nurse or doctor.          acyclovir 400 MG tablet Commonly known as: ZOVIRAX Take 400 mg by mouth 2 (two) times daily.   Adalimumab 40 MG/0.8ML Pskt Inject 160mg  (4 syringes) week 0, 80mg  (2 syringes) week 2 and 80mg  (1 syringe) week 4 and every week thereafter   allopurinol 100 MG tablet Commonly known as: ZYLOPRIM Take 1 tablet (100 mg total) by mouth daily.   amiodarone 200 MG tablet Commonly known as: PACERONE Take by mouth.   clindamycin 1 % lotion Commonly known as: CLEOCIN T Apply topically 2 (two) times daily.   colchicine 0.6 MG tablet Take 0.3 mg by mouth daily.   diltiazem 120 MG tablet Commonly known as: CARDIZEM Take 120 mg by mouth 2 (two) times daily.   freestyle lancets Use as instructed   FREESTYLE LITE test strip Generic drug: glucose blood Use as instructed   hydroxychloroquine 200 MG tablet Commonly known as: PLAQUENIL Take 200 mg by mouth daily.   isoniazid 300 MG tablet Commonly known as: NYDRAZID Take 300 mg by mouth daily.   losartan-hydrochlorothiazide 50-12.5 MG tablet Commonly known as: HYZAAR Take 1 tablet by mouth daily.   metFORMIN 500 MG 24 hr tablet Commonly known as: GLUCOPHAGE-XR Take 2 tablets (1,000 mg total) by  mouth daily with breakfast.   metoprolol succinate 100 MG 24 hr tablet Commonly known as: TOPROL-XL Take by mouth daily as needed.   multivitamin with minerals Tabs tablet Take 1 tablet by mouth daily. Complete Multivitamin   oxyCODONE 5 MG immediate release tablet Commonly known as: Oxy IR/ROXICODONE Take 5 mg by mouth 4 (four) times daily as needed.   Ozempic (1 MG/DOSE) 4 MG/3ML Sopn Generic drug: Semaglutide (1 MG/DOSE) Inject 1 mg into the skin once a week.   pantoprazole 20 MG tablet Commonly known as: PROTONIX Take by mouth.   pravastatin 40 MG tablet Commonly known as: PRAVACHOL TAKE 1 TABLET BY MOUTH  EVERY DAY   pyridOXINE 25 MG tablet Commonly known as: VITAMIN B-6 Take 1 tablet by mouth daily.   rivaroxaban 20 MG Tabs tablet Commonly known as: XARELTO Take 1 tablet (20 mg total) by mouth daily.   rivaroxaban 20 MG Tabs tablet Commonly known as: XARELTO Take 1 tablet by mouth daily with breakfast.   sildenafil 100 MG tablet Commonly known as: Viagra Take 0.5-1 tablets (50-100 mg total) by mouth daily as needed for erectile dysfunction.   sucralfate 1 g tablet Commonly known as: CARAFATE Take 1 g by mouth 5 (five) times daily.   traMADol 50 MG tablet Commonly known as: ULTRAM TAKE 1 TABLET BY MOUTH EVERY 8 HOURS AS NEEDED FOR UP TO 5 DAYS.   triamcinolone ointment 0.1 % Commonly known as: KENALOG Apply topically 2 (two) times daily.   valACYclovir 1000 MG tablet Commonly known as: VALTREX Take 1,000 mg by mouth 3 (three) times daily.        Allergies:  Allergies  Allergen Reactions   Eggs Or Egg-Derived Products Shortness Of Breath    Throat swells. Patient able to eat products that contain egg just not whole egg alone.   Fish Allergy Shortness Of Breath    Throat swells    Family History: Family History  Problem Relation Age of Onset   Hypertension Mother    Lymphoma Father    Diabetes Sister    Diabetes Brother    Diabetes Brother     Social History:  reports that he has been smoking cigarettes. He has a 7.50 pack-year smoking history. He has never used smokeless tobacco. He reports that he does not drink alcohol and does not use drugs.   Physical Exam: BP 113/74   Pulse (!) 101   Ht 6' (1.829 m)   Wt 281 lb (127.5 kg)   BMI 38.11 kg/m   Constitutional:  Alert and oriented, No acute distress. Psychiatric: Normal mood and affect.  Laboratory Data:  Urinalysis Dipstick 1+ protein/microscopy negative  Pertinent imaging: Images of a KUB performed today were personally reviewed and interpreted.  There is a large calcification in the right  upper quadrant consistent with cholelithiasis.  He had a prior [facility] at [facility] which showed cholelithiasis and no renal calculi  Assessment & Plan:    1.  Elevated PSA Urinalysis is clear Repeat PSA If PSA persistently elevated recommend scheduling prostate biopsy   Abbie Sons, MD  [facility] [address], [place], [place] [address] [phone]

## 2021-09-06 ENCOUNTER — Other Ambulatory Visit: Payer: Medicare HMO

## 2021-09-06 ENCOUNTER — Other Ambulatory Visit: Payer: Self-pay

## 2021-09-06 DIAGNOSIS — R972 Elevated prostate specific antigen [PSA]: Secondary | ICD-10-CM

## 2021-09-06 LAB — URINALYSIS, COMPLETE
Bilirubin, UA: NEGATIVE
Glucose, UA: NEGATIVE
Ketones, UA: NEGATIVE
Leukocytes,UA: NEGATIVE
Nitrite, UA: NEGATIVE
RBC, UA: NEGATIVE
Specific Gravity, UA: 1.03 (ref 1.005–1.030)
Urobilinogen, Ur: 0.2 mg/dL (ref 0.2–1.0)
pH, UA: 5.5 (ref 5.0–7.5)

## 2021-09-06 LAB — MICROSCOPIC EXAMINATION: Bacteria, UA: NONE SEEN

## 2021-09-07 LAB — PSA: Prostate Specific Ag, Serum: 23.1 ng/mL — ABNORMAL HIGH (ref 0.0–4.0)

## 2021-09-09 ENCOUNTER — Telehealth: Payer: Self-pay | Admitting: *Deleted

## 2021-09-09 NOTE — Telephone Encounter (Signed)
-----   Message from Abbie Sons, MD sent at 09/08/2021  9:39 PM EDT ----- PSA remains elevated at 23.1. recc sched prostate bx. Will need clearance to hold xarelto

## 2021-09-09 NOTE — Telephone Encounter (Signed)
Notified patient as instructed, patient pleased. Discussed follow-up appointments, patient agrees . Send clearance today to Dr/. Marcello Moores

## 2021-09-10 ENCOUNTER — Encounter: Payer: Self-pay | Admitting: *Deleted

## 2021-09-19 ENCOUNTER — Encounter: Payer: Self-pay | Admitting: Urology

## 2021-09-19 ENCOUNTER — Ambulatory Visit (INDEPENDENT_AMBULATORY_CARE_PROVIDER_SITE_OTHER): Payer: Medicare HMO | Admitting: Urology

## 2021-09-19 DIAGNOSIS — R972 Elevated prostate specific antigen [PSA]: Secondary | ICD-10-CM | POA: Diagnosis not present

## 2021-09-19 DIAGNOSIS — C61 Malignant neoplasm of prostate: Secondary | ICD-10-CM

## 2021-09-19 MED ORDER — GENTAMICIN SULFATE 40 MG/ML IJ SOLN
80.0000 mg | Freq: Once | INTRAMUSCULAR | Status: AC
  Administered 2021-09-19: 80 mg via INTRAMUSCULAR

## 2021-09-19 MED ORDER — LEVOFLOXACIN 500 MG PO TABS
500.0000 mg | ORAL_TABLET | Freq: Once | ORAL | Status: AC
  Administered 2021-09-19: 500 mg via ORAL

## 2021-09-19 NOTE — Progress Notes (Unsigned)
Prostate Biopsy Procedure   Informed consent was obtained after discussing risks/benefits of the procedure.  A time out was performed to ensure correct patient identity.  Pre-Procedure: - Last PSA Level:  Lab Results  Component Value Date   PSA 0.7 11/10/2012   - Gentamicin given prophylactically - Levaquin 500 mg administered PO -Transrectal Ultrasound performed revealing a *** gm prostate -No significant hypoechoic or median lobe noted  Procedure: - Prostate block performed using 10 cc 1% lidocaine and biopsies taken from sextant areas, a total of 12 under ultrasound guidance.  Post-Procedure: - Patient tolerated the procedure well - He was counseled to seek immediate medical attention if experiences any severe pain, significant bleeding, or fevers - Return in one week to discuss biopsy results

## 2021-09-23 LAB — SURGICAL PATHOLOGY

## 2021-09-24 ENCOUNTER — Telehealth: Payer: Self-pay | Admitting: Urology

## 2021-09-24 DIAGNOSIS — C61 Malignant neoplasm of prostate: Secondary | ICD-10-CM

## 2021-09-24 NOTE — Telephone Encounter (Signed)
I contacted Jon Mendez to discuss his prostate biopsy report.  He had no postbiopsy complaints.  PSA was 23.1.  Prostate volume 25 cc.  Pathology: 3/6 left cores were positive for prostate cancer.  6/6 cores on the right were benign.  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  Recommend PSA/PET for metastatic evaluation.  He lives in [place] and requests this to be done at [facility] since nuclear medicine department in [place] is under renovation  If no evidence of metastatic disease we briefly discussed the most common curative treatments of robotic assisted laparoscopic prostatectomy and radiation modalities.  He did request an appointment in the multidisciplinary oncology clinic at [facility] and referral was placed.

## 2021-09-30 ENCOUNTER — Ambulatory Visit: Payer: Medicare HMO | Admitting: Urology

## 2021-10-02 ENCOUNTER — Encounter
Admission: RE | Admit: 2021-10-02 | Discharge: 2021-10-02 | Disposition: A | Discharge status: Home / Self Care | Payer: Medicare HMO | Source: Ambulatory Visit | Attending: Urology | Admitting: Urology

## 2021-10-02 DIAGNOSIS — C61 Malignant neoplasm of prostate: Secondary | ICD-10-CM | POA: Insufficient documentation

## 2021-10-02 MED ORDER — PIFLIFOLASTAT F 18 (PYLARIFY) INJECTION
9.0000 | Freq: Once | INTRAVENOUS | Status: AC
  Administered 2021-10-02: 9.08 via INTRAVENOUS

## 2021-10-04 ENCOUNTER — Encounter: Payer: Self-pay | Admitting: Urology

## 2021-10-07 ENCOUNTER — Telehealth: Payer: Self-pay | Admitting: Internal Medicine

## 2021-10-07 NOTE — Telephone Encounter (Signed)
Copied from [place] [id number]. Topic: Medicare AWV >> Oct 07, 2021 10:21 AM [name] wrote: Reason for CRM:  No answer unable to leave a  message for patient to call back and schedule Medicare Annual Wellness Visit (AWV) in office.   If unable to come into the office for AWV,  please offer to do virtually or by telephone.  No hx of AWV eligible for AWVI per [facility] as of 09/05/2021  Please schedule at anytime with [facility] Health Advisor.      45 minute appointment   Any questions, please call me at [phone]

## 2021-10-12 ENCOUNTER — Other Ambulatory Visit: Payer: Self-pay | Admitting: Internal Medicine

## 2021-10-12 DIAGNOSIS — E785 Hyperlipidemia, unspecified: Secondary | ICD-10-CM

## 2021-10-14 NOTE — Telephone Encounter (Signed)
Requested Prescriptions  Pending Prescriptions Disp Refills  . pravastatin (PRAVACHOL) 40 MG tablet [Pharmacy Med Name: PRAVASTATIN SODIUM 40 MG TAB] 90 tablet 1    Sig: TAKE 1 TABLET BY MOUTH EVERY DAY     Cardiovascular:  Antilipid - Statins Failed - 10/12/2021 11:12 AM      Failed - Lipid Panel in normal range within the last 12 months    Cholesterol, Total  Date Value Ref Range Status  05/06/2021 146 100 - 199 mg/dL Final   LDL Chol Calc (NIH)  Date Value Ref Range Status  05/06/2021 78 0 - 99 mg/dL Final   HDL  Date Value Ref Range Status  05/06/2021 44 >39 mg/dL Final   Triglycerides  Date Value Ref Range Status  05/06/2021 133 0 - 149 mg/dL Final         Passed - Patient is not pregnant      Passed - Valid encounter within last 12 months    Recent Outpatient Visits          1 month ago Essential (primary) hypertension   [facility] Glean Hess, MD   2 months ago Essential (primary) hypertension   [facility] Glean Hess, MD   5 months ago Annual physical exam   [facility] Glean Hess, MD   10 months ago Paroxysmal atrial fibrillation [facility])   [facility] Glean Hess, MD   11 months ago Essential (primary) hypertension   [facility] Glean Hess, MD      Future Appointments            In 7 months Army Melia, Jesse Sans, MD [facility], [facility]

## 2021-10-24 DIAGNOSIS — C61 Malignant neoplasm of prostate: Secondary | ICD-10-CM | POA: Insufficient documentation

## 2021-10-27 ENCOUNTER — Other Ambulatory Visit: Payer: Self-pay | Admitting: Internal Medicine

## 2021-10-27 DIAGNOSIS — I10 Essential (primary) hypertension: Secondary | ICD-10-CM

## 2021-10-29 ENCOUNTER — Other Ambulatory Visit: Payer: Self-pay | Admitting: Internal Medicine

## 2021-10-29 DIAGNOSIS — Z1211 Encounter for screening for malignant neoplasm of colon: Secondary | ICD-10-CM

## 2021-10-29 NOTE — Telephone Encounter (Signed)
Requested medication (s) are due for refill today: no  Requested medication (s) are on the active medication list: no  Last refill:  08/01/21  Future visit scheduled: yes  Notes to clinic:  rx was dc'd on 07/26/21 by provider. Please advise      Requested Prescriptions  Pending Prescriptions Disp Refills   losartan-hydrochlorothiazide (HYZAAR) 100-25 MG tablet [Pharmacy Med Name: LOSARTAN-HCTZ 100-25 MG TAB] 90 tablet 1    Sig: TAKE 1 TABLET BY MOUTH EVERY DAY     Cardiovascular: ARB + Diuretic Combos Failed - 10/27/2021  9:43 AM      Failed - Cr in normal range and within 180 days    Creatinine  Date Value Ref Range Status  05/09/2012 1.50 (H) 0.60 - 1.30 mg/dL Final   Creatinine, Ser  Date Value Ref Range Status  09/04/2021 1.65 (H) 0.76 - 1.27 mg/dL Final         Passed - K in normal range and within 180 days    Potassium  Date Value Ref Range Status  09/04/2021 4.0 3.5 - 5.2 mmol/L Final  05/09/2012 4.1 3.5 - 5.1 mmol/L Final         Passed - Na in normal range and within 180 days    Sodium  Date Value Ref Range Status  09/04/2021 136 134 - 144 mmol/L Final  05/09/2012 135 (L) 136 - 145 mmol/L Final         Passed - eGFR is 10 or above and within 180 days    EGFR (African American)  Date Value Ref Range Status  05/09/2012 >60  Final   GFR calc Af Amer  Date Value Ref Range Status  03/05/2020 39 (L) >59 mL/min/1.73 Final    Comment:    **In accordance with recommendations from the NKF-ASN Task force,**   [facility] is in the process of updating its eGFR calculation to the   2021 CKD-EPI creatinine equation that estimates kidney function   without a race variable.    EGFR (Non-African Amer.)  Date Value Ref Range Status  05/09/2012 53 (L)  Final    Comment:    eGFR values <60mL/min/1.73 m2 may be an indication of chronic kidney disease (CKD). Calculated eGFR is useful in patients with stable renal function. The eGFR calculation will not be reliable in  acutely ill patients when serum creatinine is changing rapidly. It is not useful in  patients on dialysis. The eGFR calculation may not be applicable to patients at the low and high extremes of body sizes, pregnant women, and vegetarians.    GFR calc non Af Amer  Date Value Ref Range Status  03/05/2020 33 (L) >59 mL/min/1.73 Final   eGFR  Date Value Ref Range Status  09/04/2021 47 (L) >59 mL/min/1.73 Final         Passed - Patient is not pregnant      Passed - Last BP in normal range    BP Readings from Last 1 Encounters:  09/05/21 113/74         Passed - Valid encounter within last 6 months    Recent Outpatient Visits           1 month ago Essential (primary) hypertension   [facility] Berglund, Laura H, MD   3 months ago Essential (primary) hypertension   [facility] Berglund, Laura H, MD   5 months ago Annual physical exam   [facility] Berglund, Laura H, MD   10 months ago   Paroxysmal atrial fibrillation (HCC)   [facility] Berglund, Laura H, MD   1 year ago Essential (primary) hypertension   [facility] Berglund, Laura H, MD       Future Appointments             In 6 months Berglund, Laura H, MD [facility], [facility]             

## 2021-10-31 ENCOUNTER — Other Ambulatory Visit: Payer: Self-pay

## 2021-10-31 ENCOUNTER — Telehealth: Payer: Self-pay

## 2021-10-31 DIAGNOSIS — Z86010 Personal history of colonic polyps: Secondary | ICD-10-CM

## 2021-10-31 MED ORDER — NA SULFATE-K SULFATE-MG SULF 17.5-3.13-1.6 GM/177ML PO SOLN: 354.0000 mL | Freq: Once | ORAL | 0 refills | Status: AC

## 2021-10-31 NOTE — Telephone Encounter (Signed)
Gastroenterology Pre-Procedure Review  Request Date: 11/22/2021 Requesting Physician: Dr. Allen Norris   PATIENT REVIEW QUESTIONS: The patient responded to the following health history questions as indicated:    1. Are you having any GI issues? no 2. Do you have a personal history of Polyps? Yes last colonoscopy 02/17 [place]  3. Do you have a family history of Colon Cancer or Polyps? no 4. Diabetes Mellitus? Yes On Metformin and Ozempic   5. Joint replacements in the past 12 months?no 6. Major health problems in the past 3 months?no 7. Any artificial heart valves, MVP, or defibrillator?no    MEDICATIONS & ALLERGIES:    Patient reports the following regarding taking any anticoagulation/antiplatelet therapy:   Plavix, Coumadin, Eliquis, Xarelto, Lovenox, Pradaxa, Brilinta, or Effient? Yes Xarelto Dr. Clayborn Bigness  Aspirin? no  Patient confirms/reports the following medications:  Current Outpatient Medications  Medication Sig Dispense Refill   Adalimumab 40 MG/0.8ML PSKT Inject 160mg  (4 syringes) week 0, 80mg  (2 syringes) week 2 and 80mg  (1 syringe) week 4 and every week thereafter     allopurinol (ZYLOPRIM) 100 MG tablet Take 1 tablet (100 mg total) by mouth daily. 90 tablet 0   amiodarone (PACERONE) 200 MG tablet Take by mouth.     clindamycin (CLEOCIN T) 1 % lotion Apply topically 2 (two) times daily.     diltiazem (CARDIZEM CD) 240 MG 24 hr capsule Take 240 mg by mouth daily.     glucose blood (FREESTYLE LITE) test strip Use as instructed 100 each 12   hydroxychloroquine (PLAQUENIL) 200 MG tablet Take 200 mg by mouth daily.     Lancets (FREESTYLE) lancets Use as instructed 100 each 12   losartan-hydrochlorothiazide (HYZAAR) 50-12.5 MG tablet Take 1 tablet by mouth daily.     metFORMIN (GLUCOPHAGE-XR) 500 MG 24 hr tablet Take 2 tablets (1,000 mg total) by mouth daily with breakfast. 180 tablet 0   metoprolol succinate (TOPROL-XL) 100 MG 24 hr tablet Take by mouth daily as needed.     Multiple  Vitamin (MULTIVITAMIN WITH MINERALS) TABS tablet Take 1 tablet by mouth daily. Complete Multivitamin     pantoprazole (PROTONIX) 20 MG tablet Take by mouth.     pravastatin (PRAVACHOL) 40 MG tablet TAKE 1 TABLET BY MOUTH EVERY DAY 90 tablet 1   rivaroxaban (XARELTO) 20 MG TABS tablet Take 1 tablet by mouth daily with breakfast.     Semaglutide, 1 MG/DOSE, (OZEMPIC, 1 MG/DOSE,) 4 MG/3ML SOPN Inject 1 mg into the skin once a week. 9 mL 1   triamcinolone ointment (KENALOG) 0.1 % Apply topically 2 (two) times daily.     No current facility-administered medications for this visit.    Patient confirms/reports the following allergies:  Allergies  Allergen Reactions   Eggs Or Egg-Derived Products Shortness Of Breath    Throat swells. Patient able to eat products that contain egg just not whole egg alone.   Fish Allergy Shortness Of Breath    Throat swells    No orders of the defined types were placed in this encounter.   AUTHORIZATION INFORMATION Primary Insurance: 1D#: Group #:  Secondary Insurance: 1D#: Group #:  SCHEDULE INFORMATION: Date:  Time: Location:

## 2021-11-06 ENCOUNTER — Telehealth: Payer: Self-pay

## 2021-11-06 NOTE — Telephone Encounter (Signed)
Dr. Clayborn Bigness got back with [place] about stopping patient Xarelto before his colonoscopy. He wants him to stop it 2 days before procedure and restart it 1 day after procedure

## 2021-11-06 NOTE — Telephone Encounter (Signed)
Patient verbalized understanding of instructions  

## 2021-11-14 ENCOUNTER — Other Ambulatory Visit: Payer: Self-pay | Admitting: Internal Medicine

## 2021-11-14 DIAGNOSIS — E118 Type 2 diabetes mellitus with unspecified complications: Secondary | ICD-10-CM

## 2021-11-14 NOTE — Telephone Encounter (Signed)
Requested Prescriptions  Pending Prescriptions Disp Refills  . metFORMIN (GLUCOPHAGE-XR) 500 MG 24 hr tablet [Pharmacy Med Name: METFORMIN HCL ER 500 MG TABLET] 180 tablet 0    Sig: TAKE 2 TABLETS BY MOUTH EVERY DAY WITH BREAKFAST     Endocrinology:  Diabetes - Biguanides Failed - 11/14/2021  2:15 AM      Failed - Cr in normal range and within 360 days    Creatinine  Date Value Ref Range Status  05/09/2012 1.50 (H) 0.60 - 1.30 mg/dL Final   Creatinine, Ser  Date Value Ref Range Status  09/04/2021 1.65 (H) 0.76 - 1.27 mg/dL Final         Failed - eGFR in normal range and within 360 days    EGFR (African American)  Date Value Ref Range Status  05/09/2012 >60  Final   GFR calc Af Amer  Date Value Ref Range Status  03/05/2020 39 (L) >59 mL/min/1.73 Final    Comment:    **In accordance with recommendations from the NKF-ASN Task force,**   [facility] is in the process of updating its eGFR calculation to the   2021 CKD-EPI creatinine equation that estimates kidney function   without a race variable.    EGFR (Non-African Amer.)  Date Value Ref Range Status  05/09/2012 53 (L)  Final    Comment:    eGFR values <13mL/min/1.73 m2 may be an indication of chronic kidney disease (CKD). Calculated eGFR is useful in patients with stable renal function. The eGFR calculation will not be reliable in acutely ill patients when serum creatinine is changing rapidly. It is not useful in  patients on dialysis. The eGFR calculation may not be applicable to patients at the low and high extremes of body sizes, pregnant women, and vegetarians.    GFR calc non Af Amer  Date Value Ref Range Status  03/05/2020 33 (L) >59 mL/min/1.73 Final   eGFR  Date Value Ref Range Status  09/04/2021 47 (L) >59 mL/min/1.73 Final         Failed - B12 Level in normal range and within 720 days    No results found for: "VITAMINB12"       Passed - HBA1C is between 0 and 7.9 and within 180 days    Hemoglobin A1C   Date Value Ref Range Status  10/01/2020 8.2  Final   Hgb A1c MFr Bld  Date Value Ref Range Status  09/04/2021 6.6 (H) 4.8 - 5.6 % Final    Comment:             Prediabetes: 5.7 - 6.4          Diabetes: >6.4          Glycemic control for adults with diabetes: <7.0          Passed - Valid encounter within last 6 months    Recent Outpatient Visits          2 months ago Essential (primary) hypertension   [facility] Glean Hess, MD   3 months ago Essential (primary) hypertension   [facility] Glean Hess, MD   6 months ago Annual physical exam   [facility] Glean Hess, MD   11 months ago Paroxysmal atrial fibrillation [facility])   [facility] Glean Hess, MD   1 year ago Essential (primary) hypertension   [facility] Glean Hess, MD      Future Appointments  In 6 months Glean Hess, MD [facility] [place] within normal limits and completed in the last 12 months    WBC  Date Value Ref Range Status  09/04/2021 12.1 (H) 3.4 - 10.8 x10E3/uL Final  05/06/2018 13.9 (H) 4.0 - 10.5 K/uL Final   RBC  Date Value Ref Range Status  09/04/2021 5.48 4.14 - 5.80 x10E6/uL Final  05/06/2018 5.55 4.22 - 5.81 MIL/uL Final   Hemoglobin  Date Value Ref Range Status  09/04/2021 12.2 (L) 13.0 - 17.7 g/dL Final   Hematocrit  Date Value Ref Range Status  09/04/2021 38.6 37.5 - 51.0 % Final   MCHC  Date Value Ref Range Status  09/04/2021 31.6 31.5 - 35.7 g/dL Final  05/06/2018 32.3 30.0 - 36.0 g/dL Final   [facility]  Date Value Ref Range Status  09/04/2021 22.3 (L) 26.6 - 33.0 pg Final  05/06/2018 25.4 (L) 26.0 - 34.0 pg Final   MCV  Date Value Ref Range Status  09/04/2021 70 (L) 79 - 97 fL Final  05/09/2012 78 (L) 80 - 100 fL Final   No results found for: "PLTCOUNTKUC", "LABPLAT", "POCPLA" RDW  Date Value Ref Range Status  09/04/2021 17.5 (H) 11.6 - 15.4 %  Final  05/09/2012 15.4 (H) 11.5 - 14.5 % Final

## 2021-11-18 ENCOUNTER — Encounter: Payer: Self-pay | Admitting: Gastroenterology

## 2021-11-18 NOTE — Anesthesia Preprocedure Evaluation (Addendum)
Anesthesia Evaluation  Patient identified by MRN, date of birth, ID band  Reviewed: Allergy & Precautions, H&P , NPO status , Patient's Chart, lab work & pertinent test results  Airway Mallampati: III  TM Distance: >3 FB Neck ROM: full    Dental  (+) Missing, Poor Dentition   Pulmonary Current Smoker and Patient abstained from smoking.,    Pulmonary exam normal        Cardiovascular hypertension, Pt. on medications + dysrhythmias Atrial Fibrillation  Rhythm:Regular Rate:Normal   S/P ablation of atrial fibrillation     Neuro/Psych negative neurological ROS  negative psych ROS   GI/Hepatic negative GI ROS,   Endo/Other  diabetes, Oral Hypoglycemic Agents  Renal/GU Renal InsufficiencyRenal disease     Musculoskeletal   Abdominal (+) + obese,   Peds  Hematology   Anesthesia Other Findings   Reproductive/Obstetrics                           Anesthesia Physical  Anesthesia Plan  ASA: III  Anesthesia Plan: General   Post-op Pain Management:    Induction: Intravenous  PONV Risk Score and Plan: Treatment may vary due to age or medical condition  Airway Management Planned: Nasal Cannula  Additional Equipment:   Intra-op Plan:   Post-operative Plan:   Informed Consent: I have reviewed the patients History and Physical, chart, labs and discussed the procedure including the risks, benefits and alternatives for the proposed anesthesia with the patient or authorized representative who has indicated his/her understanding and acceptance.     Dental advisory given  Plan Discussed with: CRNA  Anesthesia Plan Comments:        Anesthesia Quick Evaluation

## 2021-11-22 ENCOUNTER — Encounter
Admission: RE | Disposition: A | Discharge status: Home / Self Care | Payer: Self-pay | Source: Home / Self Care | Attending: Gastroenterology

## 2021-11-22 ENCOUNTER — Encounter: Payer: Self-pay | Admitting: Gastroenterology

## 2021-11-22 ENCOUNTER — Ambulatory Visit
Admission: RE | Admit: 2021-11-22 | Discharge: 2021-11-22 | Disposition: A | Discharge status: Home / Self Care | Payer: Medicare HMO | Attending: Gastroenterology | Admitting: Gastroenterology

## 2021-11-22 ENCOUNTER — Other Ambulatory Visit: Payer: Self-pay

## 2021-11-22 ENCOUNTER — Ambulatory Visit: Payer: Medicare HMO | Admitting: Anesthesiology

## 2021-11-22 ENCOUNTER — Ambulatory Visit (AMBULATORY_SURGERY_CENTER): Payer: Medicare HMO | Admitting: Anesthesiology

## 2021-11-22 DIAGNOSIS — K635 Polyp of colon: Secondary | ICD-10-CM

## 2021-11-22 DIAGNOSIS — Z6838 Body mass index (BMI) 38.0-38.9, adult: Secondary | ICD-10-CM | POA: Diagnosis not present

## 2021-11-22 DIAGNOSIS — Z860100 Personal history of colon polyps, unspecified: Secondary | ICD-10-CM

## 2021-11-22 DIAGNOSIS — E1122 Type 2 diabetes mellitus with diabetic chronic kidney disease: Secondary | ICD-10-CM | POA: Insufficient documentation

## 2021-11-22 DIAGNOSIS — Z7984 Long term (current) use of oral hypoglycemic drugs: Secondary | ICD-10-CM | POA: Insufficient documentation

## 2021-11-22 DIAGNOSIS — I129 Hypertensive chronic kidney disease with stage 1 through stage 4 chronic kidney disease, or unspecified chronic kidney disease: Secondary | ICD-10-CM | POA: Diagnosis not present

## 2021-11-22 DIAGNOSIS — I4891 Unspecified atrial fibrillation: Secondary | ICD-10-CM | POA: Diagnosis not present

## 2021-11-22 DIAGNOSIS — E669 Obesity, unspecified: Secondary | ICD-10-CM | POA: Diagnosis not present

## 2021-11-22 DIAGNOSIS — N183 Chronic kidney disease, stage 3 unspecified: Secondary | ICD-10-CM | POA: Diagnosis not present

## 2021-11-22 DIAGNOSIS — E118 Type 2 diabetes mellitus with unspecified complications: Secondary | ICD-10-CM

## 2021-11-22 DIAGNOSIS — Z1211 Encounter for screening for malignant neoplasm of colon: Secondary | ICD-10-CM | POA: Diagnosis present

## 2021-11-22 DIAGNOSIS — I10 Essential (primary) hypertension: Secondary | ICD-10-CM

## 2021-11-22 DIAGNOSIS — D122 Benign neoplasm of ascending colon: Secondary | ICD-10-CM | POA: Diagnosis not present

## 2021-11-22 DIAGNOSIS — D125 Benign neoplasm of sigmoid colon: Secondary | ICD-10-CM | POA: Insufficient documentation

## 2021-11-22 DIAGNOSIS — F17210 Nicotine dependence, cigarettes, uncomplicated: Secondary | ICD-10-CM | POA: Insufficient documentation

## 2021-11-22 HISTORY — DX: Malignant neoplasm of prostate: C61

## 2021-11-22 HISTORY — PX: POLYPECTOMY: SHX5525

## 2021-11-22 HISTORY — PX: COLONOSCOPY WITH PROPOFOL: SHX5780

## 2021-11-22 LAB — GLUCOSE, CAPILLARY
Glucose-Capillary: 110 mg/dL — ABNORMAL HIGH (ref 70–99)
Glucose-Capillary: 94 mg/dL (ref 70–99)

## 2021-11-22 SURGERY — COLONOSCOPY WITH PROPOFOL
Anesthesia: General | Site: Rectum

## 2021-11-22 MED ORDER — LIDOCAINE HCL (CARDIAC) PF 100 MG/5ML IV SOSY
PREFILLED_SYRINGE | INTRAVENOUS | Status: DC | PRN
  Administered 2021-11-22: 50 mg via INTRAVENOUS

## 2021-11-22 MED ORDER — LACTATED RINGERS IV SOLN: INTRAVENOUS | Status: DC

## 2021-11-22 MED ORDER — PROPOFOL 10 MG/ML IV BOLUS
INTRAVENOUS | Status: DC | PRN
  Administered 2021-11-22: 100 mg via INTRAVENOUS
  Administered 2021-11-22 (×2): 50 mg via INTRAVENOUS
  Administered 2021-11-22 (×3): 30 mg via INTRAVENOUS

## 2021-11-22 MED ORDER — STERILE WATER FOR IRRIGATION IR SOLN
Status: DC | PRN
  Administered 2021-11-22: 150 mL

## 2021-11-22 MED ORDER — SODIUM CHLORIDE 0.9 % IV SOLN: INTRAVENOUS | Status: DC

## 2021-11-22 SURGICAL SUPPLY — 21 items

## 2021-11-22 NOTE — Anesthesia Postprocedure Evaluation (Signed)
Anesthesia Post Note  Patient: [name]  Procedure(s) Performed: COLONOSCOPY WITH PROPOFOL (Rectum) POLYPECTOMY     Patient location during evaluation: PACU Anesthesia Type: General Level of consciousness: awake and alert Pain management: pain level controlled Vital Signs Assessment: post-procedure vital signs reviewed and stable Respiratory status: spontaneous breathing, nonlabored ventilation and respiratory function stable Cardiovascular status: blood pressure returned to baseline and stable Postop Assessment: no apparent nausea or vomiting Anesthetic complications: no   There were no known notable events for this encounter.  [place] Ouch

## 2021-11-22 NOTE — Op Note (Signed)
[facility] Gastroenterology Patient Name: Jon Mendez Procedure Date: 11/22/2021 10:12 AM MRN: [id number] Account #: [id number] Date of Birth: 1960/11/18 Admit Type: Outpatient Age: 61 Room: [facility] OR ROOM 01 Gender: Male Note Status: Finalized Instrument Name: [id number] Procedure:             Colonoscopy Indications:           High risk colon cancer surveillance: Personal history                         of colonic polyps Providers:             Lucilla Lame MD, MD Referring MD:          Halina Maidens, MD (Referring MD) Medicines:             Propofol per Anesthesia Complications:         No immediate complications. Procedure:             Pre-Anesthesia Assessment:                        - Prior to the procedure, a History and Physical was                         performed, and patient medications and allergies were                         reviewed. The patient's tolerance of previous                         anesthesia was also reviewed. The risks and benefits                         of the procedure and the sedation options and risks                         were discussed with the patient. All questions were                         answered, and informed consent was obtained. Prior                         Anticoagulants: The patient has taken no previous                         anticoagulant or antiplatelet agents. ASA Grade                         Assessment: II - A patient with mild systemic disease.                         After reviewing the risks and benefits, the patient                         was deemed in satisfactory condition to undergo the                         procedure.  After obtaining informed consent, the colonoscope was                         passed under direct vision. Throughout the procedure,                         the patient's blood pressure, pulse, and oxygen                         saturations were monitored  continuously. The                         Colonoscope was introduced through the anus and                         advanced to the the cecum, identified by appendiceal                         orifice and ileocecal valve. The colonoscopy was                         performed without difficulty. The patient tolerated                         the procedure well. The quality of the bowel                         preparation was excellent. Findings:      The perianal and digital rectal examinations were normal.      A 5 mm polyp was found in the sigmoid colon. The polyp was sessile. The       polyp was removed with a cold snare. Resection and retrieval were       complete.      Three sessile polyps were found in the ascending colon. The polyps were       4 to 6 mm in size. These polyps were removed with a cold snare.       Resection and retrieval were complete.      Two sessile polyps were found in the descending colon. The polyps were 3       to 4 mm in size. These polyps were removed with a cold snare. Resection       and retrieval were complete. Impression:            - One 5 mm polyp in the sigmoid colon, removed with a                         cold snare. Resected and retrieved.                        - Three 4 to 6 mm polyps in the ascending colon,                         removed with a cold snare. Resected and retrieved.                        - Two 3 to 4 mm polyps in the descending colon,  removed with a cold snare. Resected and retrieved. Recommendation:        - Discharge patient to home.                        - Resume previous diet.                        - Continue present medications.                        - Await pathology results.                        - Repeat colonoscopy in 3 years for surveillance. Procedure Code(s):     --- Professional ---                        [phone], Colonoscopy, flexible; with removal of                         tumor(s),  polyp(s), or other lesion(s) by snare                         technique Diagnosis Code(s):     --- Professional ---                        Z86.010, Personal history of colonic polyps                        K63.5, Polyp of colon CPT copyright 2019 American Medical Association. All rights reserved. The codes documented in this report are preliminary and upon coder review may  be revised to meet current compliance requirements. Lucilla Lame MD, MD 11/22/2021 10:36:53 AM This report has been signed electronically. Number of Addenda: 0 Note Initiated On: 11/22/2021 10:12 AM Total Procedure Duration: 0 hours 12 minutes 25 seconds  Estimated Blood Loss:  Estimated blood loss: none.      [facility]

## 2021-11-22 NOTE — H&P (Signed)
Lucilla Lame, MD [facility] [address] [place], [place] [address] [phone] Fax : [phone]  Primary Care Physician:  Glean Hess, MD Primary Gastroenterologist:  Dr. Allen Norris  Pre-Procedure History & Physical: HPI:  Jon Mendez is a 61 y.o. male is here for an colonoscopy.   Past Medical History:  Diagnosis Date   CKD (chronic kidney disease), stage III ([place])    Diabetes mellitus without complication ([place])    H/O hidradenitis suppurativa    H/O varicose veins    High cholesterol    Hypertension    Prostate CA [facility])    Vertigo     Past Surgical History:  Procedure Laterality Date   CARDIAC ELECTROPHYSIOLOGY MAPPING AND ABLATION  07/2021   CARDIOVERSION N/A 06/17/2018   Procedure: CARDIOVERSION (CATH LAB);  Surgeon: Minna Merritts, MD;  Location: [facility] ORS;  Service: Cardiovascular;  Laterality: N/A;   COLONOSCOPY WITH PROPOFOL N/A 06/04/2015   Procedure: COLONOSCOPY WITH PROPOFOL;  Surgeon: Lucilla Lame, MD;  5 tubular adenomas   HIP ARTHROPLASTY Left 2013   LEG SKIN LESION  BIOPSY / EXCISION Right 2015   Hidradenitis   POLYPECTOMY  06/04/2015   Procedure: POLYPECTOMY;  Surgeon: Lucilla Lame, MD;  Location: [place];  Service: Endoscopy;;   VARICOSE VEIN SURGERY Bilateral 2015    Prior to Admission medications   Medication Sig Start Date End Date Taking? Authorizing Provider  Adalimumab 40 MG/0.8ML PSKT Inject 160mg  (4 syringes) week 0, 80mg  (2 syringes) week 2 and 80mg  (1 syringe) week 4 and every week thereafter 01/14/21  Yes [provider]  allopurinol (ZYLOPRIM) 100 MG tablet Take 1 tablet (100 mg total) by mouth daily. 04/12/21  Yes Glean Hess, MD  amiodarone (PACERONE) 200 MG tablet Take by mouth. 11/25/20  Yes [provider]  diltiazem (CARDIZEM CD) 240 MG 24 hr capsule Take 240 mg by mouth daily. 09/16/21  Yes [provider]  hydroxychloroquine (PLAQUENIL) 200 MG tablet Take 200 mg by mouth  daily. 11/25/20  Yes [provider]  losartan-hydrochlorothiazide (HYZAAR) 50-12.5 MG tablet Take 1 tablet by mouth daily. 05/28/21  Yes [place], Dwayne D, MD  metFORMIN (GLUCOPHAGE-XR) 500 MG 24 hr tablet TAKE 2 TABLETS BY MOUTH EVERY DAY WITH BREAKFAST 11/14/21  Yes Glean Hess, MD  metoprolol succinate (TOPROL-XL) 100 MG 24 hr tablet Take by mouth daily as needed. 08/30/21 08/30/22 Yes [provider]  Multiple Vitamin (MULTIVITAMIN WITH MINERALS) TABS tablet Take 1 tablet by mouth daily. Complete Multivitamin   Yes [provider]  pravastatin (PRAVACHOL) 40 MG tablet TAKE 1 TABLET BY MOUTH EVERY DAY 10/14/21  Yes Glean Hess, MD  rivaroxaban (XARELTO) 20 MG TABS tablet Take 1 tablet by mouth daily with breakfast. 05/08/21  Yes [provider]  Semaglutide, 1 MG/DOSE, (OZEMPIC, 1 MG/DOSE,) 4 MG/3ML SOPN Inject 1 mg into the skin once a week. 09/04/21  Yes Glean Hess, MD  clindamycin (CLEOCIN T) 1 % lotion Apply topically 2 (two) times daily. Patient not taking: Reported on 11/18/2021 11/29/20   [provider]  glucose blood (FREESTYLE LITE) test strip Use as instructed 06/20/20   Glean Hess, MD  Lancets (FREESTYLE) lancets Use as instructed 06/20/20   Glean Hess, MD  pantoprazole (PROTONIX) 20 MG tablet Take by mouth. Patient not taking: Reported on 11/18/2021 07/18/21 11/18/21  [provider]  triamcinolone ointment (KENALOG) 0.1 % Apply topically 2 (two) times daily. Patient not taking: Reported on 11/18/2021  07/25/19   [provider]    Allergies as of 10/31/2021 - Review Complete 09/19/2021  Allergen Reaction Noted   Eggs or egg-derived products Shortness Of Breath 11/22/2014   Fish allergy Shortness Of Breath 04/25/2015    Family History  Problem Relation Age of Onset   Hypertension Mother    Lymphoma Father    Diabetes Sister    Diabetes Brother    Diabetes Brother     Social History    Socioeconomic History   Marital status: Married    Spouse name: Not on file   Number of children: Not on file   Years of education: Not on file   Highest education level: Not on file  Occupational History   Not on file  Tobacco Use   Smoking status: Every Day    Packs/day: 0.25    Years: 30.00    Total pack years: 7.50    Types: Cigarettes   Smokeless tobacco: Never  Vaping Use   Vaping Use: Never used  Substance and Sexual Activity   Alcohol use: No    Alcohol/week: 4.0 standard drinks of alcohol    Types: 4 Standard drinks or equivalent per week   Drug use: No   Sexual activity: Yes  Other Topics Concern   Not on file  Social History Narrative   Not on file   Social Determinants of Health   Financial Resource Strain: Not on file  Food Insecurity: Not on file  Transportation Needs: Not on file  Physical Activity: Not on file  Stress: Not on file  Social Connections: Not on file  Intimate Partner Violence: Not on file    Review of Systems: See HPI, otherwise negative ROS  Physical Exam: Ht 6' (1.829 m)   Wt 127.5 kg   BMI 38.11 kg/m  General:   Alert,  pleasant and cooperative in NAD Head:  Normocephalic and atraumatic. Neck:  Supple; no masses or thyromegaly. Lungs:  Clear throughout to auscultation.    Heart:  Regular rate and rhythm. Abdomen:  Soft, nontender and nondistended. Normal bowel sounds, without guarding, and without rebound.   Neurologic:  Alert and  oriented x4;  grossly normal neurologically.  Impression/Plan: Jon Mendez is here for an colonoscopy to be performed for a history of adenomatous polyps on 2017   Risks, benefits, limitations, and alternatives regarding  colonoscopy have been reviewed with the patient.  Questions have been answered.  All parties agreeable.   Lucilla Lame, MD  11/22/2021, 9:39 AM

## 2021-11-22 NOTE — Transfer of Care (Signed)
Immediate Anesthesia Transfer of Care Note  Patient: Jon Mendez  Procedure(s) Performed: COLONOSCOPY WITH PROPOFOL (Rectum) POLYPECTOMY  Patient Location: PACU  Anesthesia Type: General  Level of Consciousness: awake, alert  and patient cooperative  Airway and Oxygen Therapy: Patient Spontanous Breathing and Patient connected to supplemental oxygen  Post-op Assessment: Post-op Vital signs reviewed, Patient's Cardiovascular Status Stable, Respiratory Function Stable, Patent Airway and No signs of Nausea or vomiting  Post-op Vital Signs: Reviewed and stable  Complications: There were no known notable events for this encounter.

## 2021-11-25 ENCOUNTER — Encounter: Payer: Self-pay | Admitting: Gastroenterology

## 2021-11-26 ENCOUNTER — Encounter: Payer: Self-pay | Admitting: Gastroenterology

## 2021-11-26 LAB — SURGICAL PATHOLOGY

## 2021-12-03 ENCOUNTER — Encounter: Payer: Self-pay | Admitting: Emergency Medicine

## 2021-12-11 ENCOUNTER — Ambulatory Visit: Payer: Medicare HMO

## 2021-12-11 ENCOUNTER — Telehealth: Payer: Self-pay

## 2021-12-11 NOTE — Telephone Encounter (Signed)
Tried calling pt to do Medicare Wellness Visit for today over the phone, but patient is not home at this time.  - Jon Mendez

## 2021-12-25 ENCOUNTER — Other Ambulatory Visit: Payer: Self-pay | Admitting: Internal Medicine

## 2021-12-25 DIAGNOSIS — E118 Type 2 diabetes mellitus with unspecified complications: Secondary | ICD-10-CM

## 2021-12-25 DIAGNOSIS — I10 Essential (primary) hypertension: Secondary | ICD-10-CM

## 2021-12-25 NOTE — Telephone Encounter (Signed)
Losartan/HCTZ 100/25- no longer current dosing Metformin 500- Rx 11/14/21 #180- too soon Requested Prescriptions  Pending Prescriptions Disp Refills  . losartan-hydrochlorothiazide (HYZAAR) 100-25 MG tablet [Pharmacy Med Name: LOSARTAN-HCTZ 100-25 MG TAB] 90 tablet 1    Sig: TAKE 1 TABLET BY MOUTH EVERY DAY     Cardiovascular: ARB + Diuretic Combos Failed - 12/25/2021 11:02 AM      Failed - Cr in normal range and within 180 days    Creatinine  Date Value Ref Range Status  05/09/2012 1.50 (H) 0.60 - 1.30 mg/dL Final   Creatinine, Ser  Date Value Ref Range Status  09/04/2021 1.65 (H) 0.76 - 1.27 mg/dL Final         Passed - K in normal range and within 180 days    Potassium  Date Value Ref Range Status  09/04/2021 4.0 3.5 - 5.2 mmol/L Final  05/09/2012 4.1 3.5 - 5.1 mmol/L Final         Passed - Na in normal range and within 180 days    Sodium  Date Value Ref Range Status  09/04/2021 136 134 - 144 mmol/L Final  05/09/2012 135 (L) 136 - 145 mmol/L Final         Passed - eGFR is 10 or above and within 180 days    EGFR (African American)  Date Value Ref Range Status  05/09/2012 >60  Final   GFR calc Af Amer  Date Value Ref Range Status  03/05/2020 39 (L) >59 mL/min/1.73 Final    Comment:    **In accordance with recommendations from the NKF-ASN Task force,**   [facility] is in the process of updating its eGFR calculation to the   2021 CKD-EPI creatinine equation that estimates kidney function   without a race variable.    EGFR (Non-African Amer.)  Date Value Ref Range Status  05/09/2012 53 (L)  Final    Comment:    eGFR values <83mL/min/1.73 m2 may be an indication of chronic kidney disease (CKD). Calculated eGFR is useful in patients with stable renal function. The eGFR calculation will not be reliable in acutely ill patients when serum creatinine is changing rapidly. It is not useful in  patients on dialysis. The eGFR calculation may not be applicable to patients at  the low and high extremes of body sizes, pregnant women, and vegetarians.    GFR calc non Af Amer  Date Value Ref Range Status  03/05/2020 33 (L) >59 mL/min/1.73 Final   eGFR  Date Value Ref Range Status  09/04/2021 47 (L) >59 mL/min/1.73 Final         Passed - Patient is not pregnant      Passed - Last BP in normal range    BP Readings from Last 1 Encounters:  11/22/21 113/78         Passed - Valid encounter within last 6 months    Recent Outpatient Visits          3 months ago Essential (primary) hypertension   [place] [facility] at [facility], Jesse Sans, MD   4 months ago Essential (primary) hypertension   [place] [facility] at [facility], Jesse Sans, MD   7 months ago Annual physical exam   [facility], Jesse Sans, MD   1 year ago Paroxysmal atrial fibrillation [facility])   [place] [facility] at [facility],  Nyoka Cowden, MD   1 year ago Essential (primary) hypertension   [place] [facility] at [facility], Nyoka Cowden, MD      Future Appointments            In 4 months Judithann Graves Nyoka Cowden, MD [facility] and [facility] Medicine at [facility]           . metFORMIN (GLUCOPHAGE-XR) 500 MG 24 hr tablet [Pharmacy Med Name: METFORMIN HCL ER 500 MG TABLET] 180 tablet 0    Sig: TAKE 2 TABLETS BY MOUTH EVERY DAY WITH BREAKFAST     Endocrinology:  Diabetes - Biguanides Failed - 12/25/2021 11:02 AM      Failed - Cr in normal range and within 360 days    Creatinine  Date Value Ref Range Status  05/09/2012 1.50 (H) 0.60 - 1.30 mg/dL Final   Creatinine, Ser  Date Value Ref Range Status  09/04/2021 1.65 (H) 0.76 - 1.27 mg/dL Final         Failed - eGFR in normal range and within 360 days    EGFR (African American)  Date Value Ref Range Status   05/09/2012 >60  Final   GFR calc Af Amer  Date Value Ref Range Status  03/05/2020 39 (L) >59 mL/min/1.73 Final    Comment:    **In accordance with recommendations from the NKF-ASN Task force,**   [facility] is in the process of updating its eGFR calculation to the   2021 CKD-EPI creatinine equation that estimates kidney function   without a race variable.    EGFR (Non-African Amer.)  Date Value Ref Range Status  05/09/2012 53 (L)  Final    Comment:    eGFR values <24mL/min/1.73 m2 may be an indication of chronic kidney disease (CKD). Calculated eGFR is useful in patients with stable renal function. The eGFR calculation will not be reliable in acutely ill patients when serum creatinine is changing rapidly. It is not useful in  patients on dialysis. The eGFR calculation may not be applicable to patients at the low and high extremes of body sizes, pregnant women, and vegetarians.    GFR calc non Af Amer  Date Value Ref Range Status  03/05/2020 33 (L) >59 mL/min/1.73 Final   eGFR  Date Value Ref Range Status  09/04/2021 47 (L) >59 mL/min/1.73 Final         Failed - B12 Level in normal range and within 720 days    No results found for: "VITAMINB12"       Passed - HBA1C is between 0 and 7.9 and within 180 days    Hemoglobin A1C  Date Value Ref Range Status  10/01/2020 8.2  Final   Hgb A1c MFr Bld  Date Value Ref Range Status  09/04/2021 6.6 (H) 4.8 - 5.6 % Final    Comment:             Prediabetes: 5.7 - 6.4          Diabetes: >6.4          Glycemic control for adults with diabetes: <7.0          Passed - Valid encounter within last 6 months    Recent Outpatient Visits          3 months ago Essential (primary) hypertension   [place] [facility] at [facility], Nyoka Cowden, MD   4 months ago Essential (primary) hypertension  [place] [facility] at [facility], Jesse Sans, MD   7 months ago  Annual physical exam   [place] [facility] at [facility], Jesse Sans, MD   1 year ago Paroxysmal atrial fibrillation [facility])   [place] [facility] and Sports Medicine at [facility], Jesse Sans, MD   1 year ago Essential (primary) hypertension   [place] [facility] Medicine at [facility], Jesse Sans, MD      Future Appointments            In 4 months Army Melia Jesse Sans, MD [facility] Primary Care and Sports Medicine at [facility] [place] within normal limits and completed in the last 12 months    WBC  Date Value Ref Range Status  09/04/2021 12.1 (H) 3.4 - 10.8 x10E3/uL Final  05/06/2018 13.9 (H) 4.0 - 10.5 K/uL Final   RBC  Date Value Ref Range Status  09/04/2021 5.48 4.14 - 5.80 x10E6/uL Final  05/06/2018 5.55 4.22 - 5.81 MIL/uL Final   Hemoglobin  Date Value Ref Range Status  09/04/2021 12.2 (L) 13.0 - 17.7 g/dL Final   Hematocrit  Date Value Ref Range Status  09/04/2021 38.6 37.5 - 51.0 % Final   MCHC  Date Value Ref Range Status  09/04/2021 31.6 31.5 - 35.7 g/dL Final  05/06/2018 32.3 30.0 - 36.0 g/dL Final   [facility]  Date Value Ref Range Status  09/04/2021 22.3 (L) 26.6 - 33.0 pg Final  05/06/2018 25.4 (L) 26.0 - 34.0 pg Final   MCV  Date Value Ref Range Status  09/04/2021 70 (L) 79 - 97 fL Final  05/09/2012 78 (L) 80 - 100 fL Final   No results found for: "PLTCOUNTKUC", "LABPLAT", "POCPLA" RDW  Date Value Ref Range Status  09/04/2021 17.5 (H) 11.6 - 15.4 % Final  05/09/2012 15.4 (H) 11.5 - 14.5 % Final

## 2022-01-13 ENCOUNTER — Telehealth: Payer: Self-pay | Admitting: Internal Medicine

## 2022-01-13 NOTE — Telephone Encounter (Signed)
Copied from [place] [id number]. Topic: Medicare AWV >> Jan 13, 2022  9:44 AM [name] wrote: Reason for CRM:  Left message for patient to call back and schedule Medicare Annual Wellness Visit (AWV) in office.   If unable to come into the office for AWV,  please offer to do virtually or by telephone.  No hx of AWV eligible for AWVI per [facility] as of 09/05/2021  Please schedule at any time with [facility] -[facility]urse Health Advisor.      45 minute appointment   Any questions, please call me at [phone]

## 2022-01-28 ENCOUNTER — Other Ambulatory Visit: Payer: Self-pay | Admitting: Internal Medicine

## 2022-01-28 DIAGNOSIS — I10 Essential (primary) hypertension: Secondary | ICD-10-CM

## 2022-01-28 DIAGNOSIS — E785 Hyperlipidemia, unspecified: Secondary | ICD-10-CM

## 2022-01-28 DIAGNOSIS — E118 Type 2 diabetes mellitus with unspecified complications: Secondary | ICD-10-CM

## 2022-01-29 NOTE — Telephone Encounter (Signed)
Requested Prescriptions  Pending Prescriptions Disp Refills  . pravastatin (PRAVACHOL) 40 MG tablet [Pharmacy Med Name: PRAVASTATIN SODIUM 40 MG TAB] 90 tablet 0    Sig: TAKE 1 TABLET BY MOUTH EVERY DAY     Cardiovascular:  Antilipid - Statins Failed - 01/28/2022 10:14 AM      Failed - Lipid Panel in normal range within the last 12 months    Cholesterol, Total  Date Value Ref Range Status  05/06/2021 146 100 - 199 mg/dL Final   LDL Chol Calc (NIH)  Date Value Ref Range Status  05/06/2021 78 0 - 99 mg/dL Final   HDL  Date Value Ref Range Status  05/06/2021 44 >39 mg/dL Final   Triglycerides  Date Value Ref Range Status  05/06/2021 133 0 - 149 mg/dL Final         Passed - Patient is not pregnant      Passed - Valid encounter within last 12 months    Recent Outpatient Visits          4 months ago Essential (primary) hypertension   [place] [facility] at [facility], Jesse Sans, MD   6 months ago Essential (primary) hypertension   [place] [facility] at [facility], Jesse Sans, MD   8 months ago Annual physical exam   [place] [facility] Medicine at [facility], Jesse Sans, MD   1 year ago Paroxysmal atrial fibrillation [facility])   [place] Primary Care and Sports Medicine at [facility], Jesse Sans, MD   1 year ago Essential (primary) hypertension   [place] [facility] and Sports Medicine at [facility], Jesse Sans, MD      Future Appointments            In 3 months Army Melia Jesse Sans, MD [facility] Primary Care and Sports Medicine at [facility] [place]           . losartan-hydrochlorothiazide (HYZAAR) 100-25 MG tablet [Pharmacy Med Name: LOSARTAN-HCTZ 100-25 MG TAB] 90 tablet 1    Sig: TAKE 1 TABLET BY MOUTH EVERY DAY     Cardiovascular: ARB + Diuretic Combos Failed - 01/28/2022 10:14 AM      Failed - Cr in normal range and within 180  days    Creatinine  Date Value Ref Range Status  05/09/2012 1.50 (H) 0.60 - 1.30 mg/dL Final   Creatinine, Ser  Date Value Ref Range Status  09/04/2021 1.65 (H) 0.76 - 1.27 mg/dL Final         Passed - K in normal range and within 180 days    Potassium  Date Value Ref Range Status  09/04/2021 4.0 3.5 - 5.2 mmol/L Final  05/09/2012 4.1 3.5 - 5.1 mmol/L Final         Passed - Na in normal range and within 180 days    Sodium  Date Value Ref Range Status  09/04/2021 136 134 - 144 mmol/L Final  05/09/2012 135 (L) 136 - 145 mmol/L Final         Passed - eGFR is 10 or above and within 180 days    EGFR (African American)  Date Value Ref Range Status  05/09/2012 >60  Final   GFR calc Af Amer  Date Value Ref Range Status  03/05/2020 39 (L) >59 mL/min/1.73 Final    Comment:    **In accordance with recommendations from the NKF-ASN Task force,**  [facility] is in the process of updating its eGFR calculation to the   2021 CKD-EPI creatinine equation that estimates kidney function   without a race variable.    EGFR (Non-African Amer.)  Date Value Ref Range Status  05/09/2012 53 (L)  Final    Comment:    eGFR values <84mL/min/1.73 m2 may be an indication of chronic kidney disease (CKD). Calculated eGFR is useful in patients with stable renal function. The eGFR calculation will not be reliable in acutely ill patients when serum creatinine is changing rapidly. It is not useful in  patients on dialysis. The eGFR calculation may not be applicable to patients at the low and high extremes of body sizes, pregnant women, and vegetarians.    GFR calc non Af Amer  Date Value Ref Range Status  03/05/2020 33 (L) >59 mL/min/1.73 Final   eGFR  Date Value Ref Range Status  09/04/2021 47 (L) >59 mL/min/1.73 Final         Passed - Patient is not pregnant      Passed - Last BP in normal range    BP Readings from Last 1 Encounters:  11/22/21 113/78         Passed - Valid encounter  within last 6 months    Recent Outpatient Visits          4 months ago Essential (primary) hypertension   [place] [facility] at [facility], Jesse Sans, MD   6 months ago Essential (primary) hypertension   [place] [facility] and Sports Medicine at [facility], Jesse Sans, MD   8 months ago Annual physical exam   [place] [facility] Medicine at [facility], Jesse Sans, MD   1 year ago Paroxysmal atrial fibrillation [facility])   [place] [facility] Medicine at [facility], Jesse Sans, MD   1 year ago Essential (primary) hypertension   [place] [facility] Medicine at [facility], Jesse Sans, MD      Future Appointments            In 3 months Army Melia Jesse Sans, MD [facility] Primary Care and Sports Medicine at [facility] [place]           . metFORMIN (GLUCOPHAGE-XR) 500 MG 24 hr tablet [Pharmacy Med Name: METFORMIN HCL ER 500 MG TABLET] 180 tablet 0    Sig: TAKE 2 [place]     Endocrinology:  Diabetes - Biguanides Failed - 01/28/2022 10:14 AM      Failed - Cr in normal range and within 360 days    Creatinine  Date Value Ref Range Status  05/09/2012 1.50 (H) 0.60 - 1.30 mg/dL Final   Creatinine, Ser  Date Value Ref Range Status  09/04/2021 1.65 (H) 0.76 - 1.27 mg/dL Final         Failed - eGFR in normal range and within 360 days    EGFR (African American)  Date Value Ref Range Status  05/09/2012 >60  Final   GFR calc Af Amer  Date Value Ref Range Status  03/05/2020 39 (L) >59 mL/min/1.73 Final    Comment:    **In accordance with recommendations from the NKF-ASN Task force,**   [facility] is in the process of updating its eGFR calculation to the   2021 CKD-EPI creatinine equation that estimates kidney function   without a race variable.    EGFR (  Laqueta Jean.)  Date Value Ref Range Status  05/09/2012 53  (L)  Final    Comment:    eGFR values <47mL/min/1.73 m2 may be an indication of chronic kidney disease (CKD). Calculated eGFR is useful in patients with stable renal function. The eGFR calculation will not be reliable in acutely ill patients when serum creatinine is changing rapidly. It is not useful in  patients on dialysis. The eGFR calculation may not be applicable to patients at the low and high extremes of body sizes, pregnant women, and vegetarians.    GFR calc non Af Amer  Date Value Ref Range Status  03/05/2020 33 (L) >59 mL/min/1.73 Final   eGFR  Date Value Ref Range Status  09/04/2021 47 (L) >59 mL/min/1.73 Final         Failed - B12 Level in normal range and within 720 days    No results found for: "VITAMINB12"       Passed - HBA1C is between 0 and 7.9 and within 180 days    Hemoglobin A1C  Date Value Ref Range Status  10/01/2020 8.2  Final   Hgb A1c MFr Bld  Date Value Ref Range Status  09/04/2021 6.6 (H) 4.8 - 5.6 % Final    Comment:             Prediabetes: 5.7 - 6.4          Diabetes: >6.4          Glycemic control for adults with diabetes: <7.0          Passed - Valid encounter within last 6 months    Recent Outpatient Visits          4 months ago Essential (primary) hypertension   [place] [facility] Medicine at [facility], Jesse Sans, MD   6 months ago Essential (primary) hypertension   [place] [facility] at [facility], Jesse Sans, MD   8 months ago Annual physical exam   [facility] Primary Care and Sports Medicine at [facility], Jesse Sans, MD   1 year ago Paroxysmal atrial fibrillation [facility])   [place] [facility] Care and Sports Medicine at [facility], Jesse Sans, MD   1 year ago Essential (primary) hypertension   [place] [facility] and Sports Medicine at [facility], Jesse Sans, MD      Future Appointments            In 3 months  Army Melia Jesse Sans, MD [facility] Primary Care and Sports Medicine at [facility] [place] within normal limits and completed in the last 12 months    WBC  Date Value Ref Range Status  09/04/2021 12.1 (H) 3.4 - 10.8 x10E3/uL Final  05/06/2018 13.9 (H) 4.0 - 10.5 K/uL Final   RBC  Date Value Ref Range Status  09/04/2021 5.48 4.14 - 5.80 x10E6/uL Final  05/06/2018 5.55 4.22 - 5.81 MIL/uL Final   Hemoglobin  Date Value Ref Range Status  09/04/2021 12.2 (L) 13.0 - 17.7 g/dL Final   Hematocrit  Date Value Ref Range Status  09/04/2021 38.6 37.5 - 51.0 % Final   MCHC  Date Value Ref Range Status  09/04/2021 31.6 31.5 - 35.7 g/dL Final  05/06/2018 32.3 30.0 - 36.0 g/dL Final   [facility]  Date Value Ref Range Status  09/04/2021 22.3 (L) 26.6 - 33.0 pg  Final  05/06/2018 25.4 (L) 26.0 - 34.0 pg Final   MCV  Date Value Ref Range Status  09/04/2021 70 (L) 79 - 97 fL Final  05/09/2012 78 (L) 80 - 100 fL Final   No results found for: "PLTCOUNTKUC", "LABPLAT", "POCPLA" RDW  Date Value Ref Range Status  09/04/2021 17.5 (H) 11.6 - 15.4 % Final  05/09/2012 15.4 (H) 11.5 - 14.5 % Final

## 2022-02-07 ENCOUNTER — Telehealth: Payer: Self-pay | Admitting: Internal Medicine

## 2022-02-07 NOTE — Telephone Encounter (Signed)
Copied from [facility] [phone]. Topic: Medicare AWV >> Feb 07, 2022  1:10 PM [name] wrote: Reason for CRM:  Left message for patient to call back and schedule Medicare Annual Wellness Visit (AWV) in office.   If unable to come into the office for AWV,  please offer to do virtually or by telephone.  No hx of AWV eligible for AWVI per [facility] as of 09/05/2021  Please schedule at any time with [facility] -Nurse Health Advisor.      45 minute appointment   Any questions, please call me at [phone]

## 2022-03-26 ENCOUNTER — Other Ambulatory Visit: Payer: Self-pay | Admitting: Internal Medicine

## 2022-03-26 DIAGNOSIS — I10 Essential (primary) hypertension: Secondary | ICD-10-CM

## 2022-03-26 DIAGNOSIS — E118 Type 2 diabetes mellitus with unspecified complications: Secondary | ICD-10-CM

## 2022-04-07 DIAGNOSIS — M069 Rheumatoid arthritis, unspecified: Secondary | ICD-10-CM | POA: Insufficient documentation

## 2022-05-14 ENCOUNTER — Ambulatory Visit (INDEPENDENT_AMBULATORY_CARE_PROVIDER_SITE_OTHER): Payer: Medicare HMO | Admitting: Internal Medicine

## 2022-05-14 ENCOUNTER — Encounter: Payer: Self-pay | Admitting: Internal Medicine

## 2022-05-14 VITALS — BP 138/70 | HR 78 | Ht 72.0 in | Wt 295.0 lb

## 2022-05-14 DIAGNOSIS — C61 Malignant neoplasm of prostate: Secondary | ICD-10-CM | POA: Diagnosis not present

## 2022-05-14 DIAGNOSIS — Z0000 Encounter for general adult medical examination without abnormal findings: Secondary | ICD-10-CM

## 2022-05-14 DIAGNOSIS — D6869 Other thrombophilia: Secondary | ICD-10-CM

## 2022-05-14 DIAGNOSIS — E118 Type 2 diabetes mellitus with unspecified complications: Secondary | ICD-10-CM

## 2022-05-14 DIAGNOSIS — I10 Essential (primary) hypertension: Secondary | ICD-10-CM

## 2022-05-14 DIAGNOSIS — E1169 Type 2 diabetes mellitus with other specified complication: Secondary | ICD-10-CM | POA: Diagnosis not present

## 2022-05-14 DIAGNOSIS — C3491 Malignant neoplasm of unspecified part of right bronchus or lung: Secondary | ICD-10-CM | POA: Insufficient documentation

## 2022-05-14 DIAGNOSIS — L732 Hidradenitis suppurativa: Secondary | ICD-10-CM

## 2022-05-14 DIAGNOSIS — Z23 Encounter for immunization: Secondary | ICD-10-CM

## 2022-05-14 DIAGNOSIS — N1832 Chronic kidney disease, stage 3b: Secondary | ICD-10-CM

## 2022-05-14 DIAGNOSIS — E785 Hyperlipidemia, unspecified: Secondary | ICD-10-CM

## 2022-05-14 DIAGNOSIS — I480 Paroxysmal atrial fibrillation: Secondary | ICD-10-CM

## 2022-05-14 MED ORDER — OZEMPIC (1 MG/DOSE) 4 MG/3ML ~~LOC~~ SOPN: 1.0000 mg | PEN_INJECTOR | SUBCUTANEOUS | 0 refills | Status: DC

## 2022-05-14 NOTE — Patient Instructions (Signed)
  [facility] HEMATOLOGY ONCOLOGY [address] [facility] CANCER [facility]P  [place], [place] [address]  [phone]  Rolan Lipa, MD  [address]  [id number]  [place], [place] [address]  [phone] (Work)  [phone] (Fax)

## 2022-05-14 NOTE — Assessment & Plan Note (Addendum)
Clinically stable without s/s of hypoglycemia. Tolerating metformin well without side effects or other concerns.  Stopped ozempic due to cost. Stopped Farxiga due to side effects Will send in [place] again and see what the PA says Last A1C 6.6

## 2022-05-14 NOTE — Progress Notes (Signed)
Date:  05/14/2022   Name:  Jon Mendez   DOB:  [birth year]   MRN:  [id number]   Chief Complaint: Annual Exam Jon Mendez is a 62 y.o. male who presents today for his Complete Annual Exam. He feels well. He reports exercising - some. He reports he is sleeping poorly.   Colonoscopy: 11/2021 repeat 3 yrs  Immunization History  Administered Date(s) Administered   PFIZER(Purple Top)SARS-COV-2 Vaccination 06/23/2019, 07/14/2019, 03/06/2020   PNEUMOCOCCAL CONJUGATE-20 05/14/2022   Pneumococcal Polysaccharide-23 04/28/2016   Tdap 04/09/2011   Health Maintenance Due  Topic Date Due   Medicare Annual Wellness (AWV)  Never done   Zoster Vaccines- Shingrix (1 of 2) Never done   DTaP/Tdap/Td (2 - Td or Tdap) 04/08/2021   COVID-19 Vaccine (4 - 2023-24 season) 12/06/2021   OPHTHALMOLOGY EXAM  01/18/2022   HEMOGLOBIN A1C  03/06/2022   Diabetic kidney evaluation - Urine ACR  05/06/2022    Lab Results  Component Value Date   PSA1 23.1 (H) 09/06/2021   PSA1 15.2 (H) 07/15/2021   PSA1 14.3 (H) 06/14/2021   PSA 0.7 11/10/2012    Hypertension This is a chronic problem. The problem is controlled. Pertinent negatives include no chest pain, headaches, palpitations or shortness of breath. Past treatments include angiotensin blockers, calcium channel blockers and diuretics.  Diabetes He presents for his follow-up diabetic visit. He has type 2 diabetes mellitus. Pertinent negatives for hypoglycemia include no dizziness, headaches or nervousness/anxiousness. Pertinent negatives for diabetes include no chest pain and no fatigue. Current diabetic treatment includes oral agent (monotherapy) (metformin). His breakfast blood glucose is taken between 6-7 am. His breakfast blood glucose range is generally 110-130 mg/dl. An ACE inhibitor/angiotensin II receptor blocker is being taken.  Hyperlipidemia Pertinent negatives include no chest pain, myalgias or shortness of breath.  Prostate cancer -  being treated at [facility].  Completed treatment 02/2022. On ADT with Eligard.  Lung cancer - recently found via biopsy in right upper lung. [facility] is following.  He is not sure the next step - maybe surgery.  Lab Results  Component Value Date   NA 136 09/04/2021   K 4.0 09/04/2021   CO2 23 09/04/2021   GLUCOSE 91 09/04/2021   BUN 16 09/04/2021   CREATININE 1.65 (H) 09/04/2021   CALCIUM 9.5 09/04/2021   EGFR 47 (L) 09/04/2021   GFRNONAA 33 (L) 03/05/2020   Lab Results  Component Value Date   CHOL 146 05/06/2021   HDL 44 05/06/2021   LDLCALC 78 05/06/2021   TRIG 133 05/06/2021   CHOLHDL 3.3 05/06/2021   Lab Results  Component Value Date   TSH 2.430 05/06/2021   Lab Results  Component Value Date   HGBA1C 6.6 (H) 09/04/2021   Lab Results  Component Value Date   WBC 12.1 (H) 09/04/2021   HGB 12.2 (L) 09/04/2021   HCT 38.6 09/04/2021   MCV 70 (L) 09/04/2021   PLT 319 09/04/2021   Lab Results  Component Value Date   ALT 16 09/04/2021   AST 18 09/04/2021   ALKPHOS 96 09/04/2021   BILITOT 0.3 09/04/2021   No results found for: "25OHVITD2", "25OHVITD3", "VD25OH"   Review of Systems  Constitutional:  Positive for unexpected weight change. Negative for appetite change, chills, diaphoresis and fatigue.  HENT:  Negative for hearing loss, tinnitus, trouble swallowing and voice change.   Eyes:  Negative for visual disturbance.  Respiratory:  Negative for choking, shortness of breath and wheezing.  Cardiovascular:  Negative for chest pain, palpitations and leg swelling.  Gastrointestinal:  Negative for abdominal pain, blood in stool, constipation and diarrhea.  Genitourinary:  Negative for difficulty urinating, dysuria, frequency and hematuria.  Musculoskeletal:  Negative for arthralgias, back pain and myalgias.  Skin:  Positive for rash. Negative for color change.  Neurological:  Negative for dizziness, syncope and headaches.  Hematological:  Negative for adenopathy.   Psychiatric/Behavioral:  Negative for dysphoric mood and sleep disturbance. The patient is not nervous/anxious.     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   Hip pain, chronic, right 10/24/2020   Acquired thrombophilia ([place]) 06/20/2020   Type II diabetes mellitus with complication ([place]) 62/95/2841   Tubular adenoma of colon    Stage 3b chronic kidney disease ([place]) 04/25/2015   Chronic gouty arthritis 11/22/2014   Hyperlipidemia associated with type 2 diabetes mellitus ([place]) 11/22/2014   Essential (primary) hypertension 11/22/2014   Compulsive tobacco user syndrome 11/22/2014   Varicose veins of both lower extremities 11/22/2014   Hidradenitis suppurativa 09/06/2012   Benign paroxysmal positional nystagmus 07/29/2004    Allergies  Allergen Reactions   Eggs Or Egg-Derived Products Shortness Of Breath    Throat swells. Patient able to eat products that contain egg just not whole egg alone.   Fish Allergy Shortness Of Breath    Throat swells   Acyclovir And Related     Past Surgical History:  Procedure Laterality Date   CARDIAC ELECTROPHYSIOLOGY MAPPING AND ABLATION  07/2021   CARDIOVERSION N/A 06/17/2018   Procedure: CARDIOVERSION (CATH LAB);  Surgeon: Minna Merritts, MD;  Location: [facility];  Service: Cardiovascular;  Laterality: N/A;   COLONOSCOPY WITH PROPOFOL N/A 06/04/2015   Procedure: COLONOSCOPY WITH PROPOFOL;  Surgeon: Lucilla Lame, MD;  5 tubular adenomas   COLONOSCOPY WITH PROPOFOL N/A 11/22/2021   Procedure: COLONOSCOPY WITH PROPOFOL;   Surgeon: Lucilla Lame, MD;  Location: [place];  Service: Endoscopy;  Laterality: N/A;  Diabetic   HIP ARTHROPLASTY Left 2013   LEG SKIN LESION  BIOPSY / EXCISION Right 2015   Hidradenitis   POLYPECTOMY  06/04/2015   Procedure: POLYPECTOMY;  Surgeon: Lucilla Lame, MD;  Location: [place];  Service: Endoscopy;;   POLYPECTOMY  11/22/2021   Procedure: POLYPECTOMY;  Surgeon: Lucilla Lame, MD;  Location: [place];  Service: Endoscopy;;   VARICOSE VEIN SURGERY Bilateral 2015    Social History   Tobacco Use   Smoking status: Every Day    Packs/day: 0.25    Years: 30.00    Total pack years: 7.50    Types: Cigarettes   Smokeless tobacco: Never  Vaping Use   Vaping Use: Never used  Substance Use Topics   Alcohol use: No    Alcohol/week: 4.0 standard drinks of alcohol    Types: 4 Standard drinks or equivalent per week   Drug use: No     Medication list has been reviewed and updated.  Current Meds  Medication Sig   Adalimumab 40 MG/0.8ML PSKT Inject 160mg  (4 syringes) week 0, 80mg  (2 syringes) week 2 and  80mg  (1 syringe) week 4 and every week thereafter   allopurinol (ZYLOPRIM) 100 MG tablet Take 1 tablet (100 mg total) by mouth daily.   amiodarone (PACERONE) 200 MG tablet Take 1 tablet by mouth daily.   clindamycin (CLEOCIN T) 1 % lotion Apply topically 2 (two) times daily.   diltiazem (CARDIZEM CD) 240 MG 24 hr capsule Take 240 mg by mouth daily.   doxycycline (ADOXA) 100 MG tablet Take by mouth.   glucose blood (FREESTYLE LITE) test strip Use as instructed   hydroxychloroquine (PLAQUENIL) 200 MG tablet Take 200 mg by mouth daily.   Lancets (FREESTYLE) lancets Use as instructed   losartan-hydrochlorothiazide (HYZAAR) 100-25 MG tablet TAKE 1 TABLET BY MOUTH EVERY DAY   metFORMIN (GLUCOPHAGE-XR) 500 MG 24 hr tablet TAKE 2 TABLETS BY MOUTH EVERY DAY WITH BREAKFAST   metoprolol succinate (TOPROL-XL) 100 MG 24 hr tablet Take by mouth daily as needed.    Multiple Vitamin (MULTIVITAMIN WITH MINERALS) TABS tablet Take 1 tablet by mouth daily. Complete Multivitamin   pravastatin (PRAVACHOL) 40 MG tablet TAKE 1 TABLET BY MOUTH EVERY DAY   rivaroxaban (XARELTO) 20 MG TABS tablet Take 1 tablet by mouth daily with breakfast.   tamsulosin (FLOMAX) 0.4 MG CAPS capsule Take 1 tablet by mouth daily.   [DISCONTINUED] dapagliflozin propanediol (FARXIGA) 10 MG TABS tablet Take by mouth.   [DISCONTINUED] JANUMET XR [phone] MG TB24 Take 1 tablet by mouth daily.       05/14/2022    9:21 AM 09/04/2021    9:03 AM 05/06/2021   11:03 AM 12/04/2020    1:39 PM  GAD 7 : Generalized Anxiety Score  Nervous, Anxious, on Edge 0 0 0 0  Control/stop worrying 0 0 0 0  Worry too much - different things 0 0 0 0  Trouble relaxing 0 0 0 0  Restless 0 0 0 0  Easily annoyed or irritable 0 0 0 0  Afraid - awful might happen 0 0 0 0  Total GAD 7 Score 0 0 0 0  Anxiety Difficulty Not difficult at all Not difficult at all         05/14/2022    9:20 AM 09/04/2021    9:02 AM 05/06/2021   11:03 AM  Depression screen PHQ 2/9  Decreased Interest 0 0 0  Down, Depressed, Hopeless 0 0 0  PHQ - 2 Score 0 0 0  Altered sleeping 2 0 0  Tired, decreased energy 2 0 0  Change in appetite 0 0 0  Feeling bad or failure about yourself  0 0 0  Trouble concentrating 0 0 0  Moving slowly or fidgety/restless 0 0 0  Suicidal thoughts 0 0 0  PHQ-9 Score 4 0 0  Difficult doing work/chores Not difficult at all Not difficult at all Not difficult at all    BP Readings from Last 3 Encounters:  05/14/22 138/70  11/22/21 113/78  09/05/21 113/74    Physical Exam Vitals and nursing note reviewed.  Constitutional:      Appearance: Normal appearance. He is well-developed.  HENT:     Head: Normocephalic.     Right Ear: Tympanic membrane, ear canal and external ear normal.     Left Ear: Tympanic membrane, ear canal and external ear normal.     Nose: Nose normal.  Eyes:      Conjunctiva/sclera: Conjunctivae normal.     Pupils: Pupils are equal, round, and reactive to light.  Neck:     Thyroid:  No thyromegaly.     Vascular: No carotid bruit.  Cardiovascular:     Rate and Rhythm: Normal rate and regular rhythm.     Heart sounds: Normal heart sounds.  Pulmonary:     Effort: Pulmonary effort is normal.     Breath sounds: Normal breath sounds. No wheezing.  Chest:  Breasts:    Right: No mass.     Left: No mass.  Abdominal:     General: Bowel sounds are normal.     Palpations: Abdomen is soft.     Tenderness: There is no abdominal tenderness.  Musculoskeletal:        General: Normal range of motion.     Cervical back: Normal range of motion and neck supple.     Right lower leg: No edema.     Left lower leg: No edema.  Lymphadenopathy:     Cervical: No cervical adenopathy.  Skin:    General: Skin is warm and dry.     Capillary Refill: Capillary refill takes less than 2 seconds.     Findings: Lesion and rash present.  Neurological:     General: No focal deficit present.     Mental Status: He is alert and oriented to person, place, and time.     Deep Tendon Reflexes: Reflexes are normal and symmetric.  Psychiatric:        Attention and Perception: Attention normal.        Mood and Affect: Mood normal.        Thought Content: Thought content normal.     Wt Readings from Last 3 Encounters:  05/14/22 295 lb (133.8 kg)  11/22/21 271 lb (122.9 kg)  09/05/21 281 lb (127.5 kg)    BP 138/70 (BP Location: Left Arm, Cuff Size: Large)   Pulse 78   Ht 6' (1.829 m)   Wt 295 lb (133.8 kg)   SpO2 98%   BMI 40.01 kg/m   Assessment and Plan: Problem List Items Addressed This Visit       Cardiovascular and Mediastinum   Essential (primary) hypertension (Chronic)    Clinically stable exam with fairly well controlled BP on losartan hct and metoprolol. Tolerating medications without side effects. Pt to continue current regimen and low sodium diet.        Relevant Orders   Comprehensive metabolic panel   RESOLVED: Paroxysmal atrial fibrillation (HCC) (Chronic)     Respiratory   Adenocarcinoma of right lung (HCC) (Chronic)    Recently diagnosed with bx after positive PET Awaiting follow up and treatment plan      Relevant Medications   doxycycline (ADOXA) 100 MG tablet     Endocrine   Hyperlipidemia associated with type 2 diabetes mellitus ([place]) (Chronic)    Tolerating statin medication without side effects at this time LDL is  Lab Results  Component Value Date   LDLCALC 78 05/06/2021  with a goal of < 70. Current dose of pravastatin is appropriate but will be adjusted if needed.       Relevant Medications   Semaglutide, 1 MG/DOSE, (OZEMPIC, 1 MG/DOSE,) 4 MG/3ML SOPN   Other Relevant Orders   Lipid panel   Type II diabetes mellitus with complication (HCC) (Chronic)    Clinically stable without s/s of hypoglycemia. Tolerating metformin well without side effects or other concerns.  Stopped ozempic due to cost. Stopped Farxiga due to side effects Will send in [place] again and see what the PA says Last A1C 6.6  Relevant Medications   Semaglutide, 1 MG/DOSE, (OZEMPIC, 1 MG/DOSE,) 4 MG/3ML SOPN   Other Relevant Orders   Hemoglobin A1c   Microalbumin / creatinine urine ratio     Musculoskeletal and Integument   Hidradenitis suppurativa (Chronic)    Currently being treated with Adulimumab injections with significant improvement        Genitourinary   Prostate cancer (HCC) (Chronic)    Under active treatment with ADT      Relevant Medications   doxycycline (ADOXA) 100 MG tablet   Stage 3b chronic kidney disease ([place]) (Chronic)    Followed by Nephrology  [facility] but caused dizziness so stopped        Hematopoietic and Hemostatic   Acquired thrombophilia ([place]) (Chronic)    Remains on anticoagulation per Cardiology      Other Visit Diagnoses     Annual physical exam    -  Primary   Need for  vaccination for pneumococcus       Relevant Orders   Pneumococcal conjugate vaccine 20-valent (Completed)        Partially dictated using Editor, commissioning. Any errors are unintentional.  Halina Maidens, MD [facility]  05/14/2022

## 2022-05-14 NOTE — Assessment & Plan Note (Addendum)
Tolerating statin medication without side effects at this time LDL is  Lab Results  Component Value Date   LDLCALC 78 05/06/2021   with a goal of < 70. Current dose of pravastatin is appropriate but will be adjusted if needed.

## 2022-05-14 NOTE — Assessment & Plan Note (Signed)
Currently being treated with Adulimumab injections with significant improvement

## 2022-05-14 NOTE — Assessment & Plan Note (Signed)
Followed by Nephrology  [facility] but caused dizziness so stopped

## 2022-05-14 NOTE — Assessment & Plan Note (Addendum)
Clinically stable exam with fairly well controlled BP on losartan hct and metoprolol. Tolerating medications without side effects. Pt to continue current regimen and low sodium diet.

## 2022-05-14 NOTE — Assessment & Plan Note (Signed)
Under active treatment with ADT

## 2022-05-14 NOTE — Assessment & Plan Note (Signed)
Recently diagnosed with bx after positive PET Awaiting follow up and treatment plan

## 2022-05-14 NOTE — Assessment & Plan Note (Signed)
Remains on anticoagulation per Cardiology

## 2022-05-16 LAB — COMPREHENSIVE METABOLIC PANEL
ALT: 20 IU/L (ref 0–44)
AST: 22 IU/L (ref 0–40)
Albumin/Globulin Ratio: 1 — ABNORMAL LOW (ref 1.2–2.2)
Albumin: 4.1 g/dL (ref 3.9–4.9)
Alkaline Phosphatase: 121 IU/L (ref 44–121)
BUN/Creatinine Ratio: 11 (ref 10–24)
BUN: 21 mg/dL (ref 8–27)
Bilirubin Total: 0.2 mg/dL (ref 0.0–1.2)
CO2: 21 mmol/L (ref 20–29)
Calcium: 10 mg/dL (ref 8.6–10.2)
Chloride: 99 mmol/L (ref 96–106)
Creatinine, Ser: 1.83 mg/dL — ABNORMAL HIGH (ref 0.76–1.27)
Globulin, Total: 4.3 g/dL (ref 1.5–4.5)
Glucose: 139 mg/dL — ABNORMAL HIGH (ref 70–99)
Potassium: 4.5 mmol/L (ref 3.5–5.2)
Sodium: 137 mmol/L (ref 134–144)
Total Protein: 8.4 g/dL (ref 6.0–8.5)
eGFR: 41 mL/min/{1.73_m2} — ABNORMAL LOW (ref >59)

## 2022-05-16 LAB — MICROALBUMIN / CREATININE URINE RATIO
Creatinine, Urine: 246.2 mg/dL
Microalb/Creat Ratio: 104 mg/g creat — ABNORMAL HIGH (ref 0–29)
Microalbumin, Urine: 255.7 ug/mL

## 2022-05-16 LAB — LIPID PANEL
Chol/HDL Ratio: 4.8 ratio (ref 0.0–5.0)
Cholesterol, Total: 222 mg/dL — ABNORMAL HIGH (ref 100–199)
HDL: 46 mg/dL (ref >39)
LDL Chol Calc (NIH): 137 mg/dL — ABNORMAL HIGH (ref 0–99)
Triglycerides: 220 mg/dL — ABNORMAL HIGH (ref 0–149)
VLDL Cholesterol Cal: 39 mg/dL (ref 5–40)

## 2022-05-16 LAB — HEMOGLOBIN A1C
Est. average glucose Bld gHb Est-mCnc: 163 mg/dL
Hgb A1c MFr Bld: 7.3 % — ABNORMAL HIGH (ref 4.8–5.6)

## 2022-05-19 ENCOUNTER — Other Ambulatory Visit: Payer: Self-pay | Admitting: Internal Medicine

## 2022-05-19 DIAGNOSIS — E118 Type 2 diabetes mellitus with unspecified complications: Secondary | ICD-10-CM

## 2022-05-21 NOTE — Progress Notes (Signed)
Called pt left VM to call back.  PEC may give results if patient returns call - CRM created.  KP

## 2022-06-17 DIAGNOSIS — C3491 Malignant neoplasm of unspecified part of right bronchus or lung: Secondary | ICD-10-CM | POA: Insufficient documentation

## 2022-07-28 HISTORY — PX: VIDEO ASSISTED THORACOSCOPY (VATS)/ LOBECTOMY: SHX6169

## 2022-08-04 LAB — CBC AND DIFFERENTIAL
HCT: 28 — AB (ref 41–53)
Hemoglobin: 9.2 — AB (ref 13.5–17.5)
Platelets: 258 10*3/uL (ref 150–400)
WBC: 10.8

## 2022-08-15 ENCOUNTER — Other Ambulatory Visit: Payer: Self-pay | Admitting: Internal Medicine

## 2022-08-15 ENCOUNTER — Ambulatory Visit (INDEPENDENT_AMBULATORY_CARE_PROVIDER_SITE_OTHER): Payer: Medicare HMO | Admitting: Internal Medicine

## 2022-08-15 ENCOUNTER — Encounter: Payer: Self-pay | Admitting: Internal Medicine

## 2022-08-15 VITALS — BP 122/76 | HR 64 | Ht 72.0 in | Wt 292.0 lb

## 2022-08-15 DIAGNOSIS — N1832 Chronic kidney disease, stage 3b: Secondary | ICD-10-CM | POA: Diagnosis not present

## 2022-08-15 DIAGNOSIS — E118 Type 2 diabetes mellitus with unspecified complications: Secondary | ICD-10-CM | POA: Diagnosis not present

## 2022-08-15 DIAGNOSIS — C3491 Malignant neoplasm of unspecified part of right bronchus or lung: Secondary | ICD-10-CM | POA: Diagnosis not present

## 2022-08-15 DIAGNOSIS — G8918 Other acute postprocedural pain: Secondary | ICD-10-CM | POA: Diagnosis not present

## 2022-08-15 DIAGNOSIS — Z7985 Long-term (current) use of injectable non-insulin antidiabetic drugs: Secondary | ICD-10-CM

## 2022-08-15 MED ORDER — OZEMPIC (1 MG/DOSE) 4 MG/3ML ~~LOC~~ SOPN
1.0000 mg | PEN_INJECTOR | SUBCUTANEOUS | 1 refills | Status: DC
Start: 2022-08-15 — End: 2022-11-17

## 2022-08-15 MED ORDER — OXYCODONE HCL 5 MG PO TABS
5.0000 mg | ORAL_TABLET | ORAL | 0 refills | Status: DC | PRN
Start: 2022-08-15 — End: 2023-04-14

## 2022-08-15 NOTE — Assessment & Plan Note (Addendum)
DM treated with Ozempic. Metformin stopped during hospital stay for lung cancer surgery 2/2 ARF. Will recheck BMP and A1c today

## 2022-08-15 NOTE — Progress Notes (Signed)
Date:  08/15/2022   Name:  Jon Mendez   DOB:  1960/05/01   MRN:  [id number]   Chief Complaint: Hospitalization Follow-up Hospital follow up.  Admitted to [facility] 07/28/22 to 08/04/22. TOC call done on 08/05/22.  Discharge Diagnoses: Status post right thoracotomy and upper lobectomy for a non-small cell lung cancer  Secondary Diagnosis: Principal Problem: NSCLC of right lung (CMS-HCC) Active Problems: Tobacco use disorder  OR Procedures:  Right - THORACOSCOPY, SURGICAL; WITH LOBECTOMY (SINGLE LOBE) Right - REMOVAL OF LUNG, OTHER THAN PNEUMONECTOMY; SINGLE LOBE (LOBECTOMY) Date 07/28/2022   HPI: This is a 62 year old male who underwent a right thoracotomy and right upper lobectomy for a non-small cell lung cancer.   Hospital Course: His postoperative course was complicated by acute renal failure. The nephrology service was consulted he required significant doses of Lasix to help with his fluid management. After stabilization the patient's chest tube was removed and the patient was discharged home   Discharge medications are reviewed.  Metformin was stopped due to post op ACF that improved with Lasix.  NEW MEDICATIONS: acetaminophen 500 MG tablet Commonly known as: TYLENOL Take 2 tablets (1,000 mg total) by mouth Three (3) times a day for 14 days.  cyclobenzaprine 5 MG tablet Commonly known as: FLEXERIL Take 1 tablet (5 mg total) by mouth Three (3) times a day for 10 days.  docusate sodium 100 MG capsule Commonly known as: COLACE Take 1 capsule (100 mg total) by mouth two (2) times a day.  furosemide 40 MG tablet Commonly known as: LASIX Take 1 tablet (40 mg total) by mouth daily.  adalimumab 40 mg/0.8 mL injection Commonly known as: HUMIRA Inject 160mg  (4 syringes) week 0, 80mg  (2 syringes) week 2 and 80mg  (1 syringe) week 4 and every week thereafter  MEDICATIONS that have not changed:  allopurinol 100 MG tablet Commonly known as: ZYLOPRIM  amiodarone 200 MG  tablet Commonly known as: PACERONE Take 1 tablet (200 mg total) by mouth daily.  clindamycin 1 % lotion Commonly known as: CLEOCIN T  dilTIAZem 240 MG 24 hr capsule Commonly known as: CARDIZEM CD Take 1 capsule (240 mg total) by mouth daily.  doxycycline 100 MG tablet Commonly known as: ADOXA Take 1 tablet (100 mg total) by mouth every twelve (12) hours.  freestyle 28 gauge lancets Generic drug: lancets  lancets Misc USE AS INSTRUCTED  FREESTYLE LITE STRIPS Strp Generic drug: blood sugar diagnostic  glucose blood test strip Generic drug: blood sugar diagnostic USE AS INSTRUCTED  hydroxychloroquine 200 mg tablet Commonly known as: PLAQUENIL Take 1 tablet (200 mg total) by mouth daily.  losartan-hydroCHLOROthiazide 50-12.5 mg per tablet Commonly known as: HYZAAR Take 1 tablet by mouth every morning.  metoPROLOL succinate 100 MG 24 hr tablet Commonly known as: Toprol-XL Take by mouth daily as needed.  multivitamin therapeutic with minerals 27-0.4 mg Tab Commonly known as: THERA-M Take 1 tablet by mouth daily. Complete Multivitamin  OZEMPIC 1 mg/dose (4 mg/3 mL) Pnij injection Generic drug: semaglutide INJECT 1 MG INTO THE SKIN ONCE A WEEK.  pravastatin 20 MG tablet Commonly known as: PRAVACHOL Take 2 tablets (40 mg total) by mouth daily.  sildenafil 50 MG tablet Commonly known as: VIAGRA Take 1 tablet (50 mg total) by mouth daily as needed for erectile dysfunction.  tamsulosin 0.4 mg capsule Commonly known as: FLOMAX TAKE 1 CAPSULE BY MOUTH EVERY DAY  XARELTO 20 mg tablet Generic drug: rivaroxaban Take 1 tablet (20 mg total) by mouth daily  before breakfast.   HPI  Lab Results  Component Value Date   NA 137 05/14/2022   K 4.5 05/14/2022   CO2 21 05/14/2022   GLUCOSE 139 (H) 05/14/2022   BUN 21 05/14/2022   CREATININE 1.83 (H) 05/14/2022   CALCIUM 10.0 05/14/2022   EGFR 41 (L) 05/14/2022   GFRNONAA 33 (L) 03/05/2020   Lab Results  Component  Value Date   CHOL 222 (H) 05/14/2022   HDL 46 05/14/2022   LDLCALC 137 (H) 05/14/2022   TRIG 220 (H) 05/14/2022   CHOLHDL 4.8 05/14/2022   Lab Results  Component Value Date   TSH 2.430 05/06/2021   Lab Results  Component Value Date   HGBA1C 7.3 (H) 05/14/2022   Lab Results  Component Value Date   WBC 12.1 (H) 09/04/2021   HGB 12.2 (L) 09/04/2021   HCT 38.6 09/04/2021   MCV 70 (L) 09/04/2021   PLT 319 09/04/2021   Lab Results  Component Value Date   ALT 20 05/14/2022   AST 22 05/14/2022   ALKPHOS 121 05/14/2022   BILITOT 0.2 05/14/2022   No results found for: "25OHVITD2", "25OHVITD3", "VD25OH"   Review of Systems  Constitutional:  Negative for appetite change, fatigue and unexpected weight change.  Eyes:  Negative for visual disturbance.  Respiratory:  Negative for cough, shortness of breath and wheezing.   Cardiovascular:  Positive for chest pain (from surgical sites). Negative for palpitations and leg swelling.  Gastrointestinal:  Negative for abdominal pain and blood in stool.  Endocrine: Negative for polydipsia and polyuria.  Genitourinary:  Negative for dysuria and hematuria.  Skin:  Negative for color change and rash.  Neurological:  Negative for tremors, numbness and headaches.  Psychiatric/Behavioral:  Negative for dysphoric mood.     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   Hip pain, chronic, right 10/24/2020   Acquired thrombophilia (HCC) 06/20/2020   Type II diabetes mellitus with  complication (HCC) 09/19/2015   Tubular adenoma of colon    Stage 3b chronic kidney disease (HCC) 04/25/2015   Chronic gouty arthritis 11/22/2014   Hyperlipidemia associated with type 2 diabetes mellitus (HCC) 11/22/2014   Essential (primary) hypertension 11/22/2014   Compulsive tobacco user syndrome 11/22/2014   Varicose veins of both lower extremities 11/22/2014   Hidradenitis suppurativa 09/06/2012   Benign paroxysmal positional nystagmus 07/29/2004    Allergies  Allergen Reactions   Egg-Derived Products Shortness Of Breath    Throat swells. Patient able to eat products that contain egg just not whole egg alone.   Fish Allergy Shortness Of Breath    Throat swells   Acyclovir And Related     Past Surgical History:  Procedure Laterality Date   CARDIAC ELECTROPHYSIOLOGY MAPPING AND ABLATION  07/2021   CARDIOVERSION N/A 06/17/2018   Procedure: CARDIOVERSION (CATH LAB);  Surgeon: Antonieta Iba, MD;  Location: [facility] ORS;  Service: Cardiovascular;  Laterality: N/A;   COLONOSCOPY WITH PROPOFOL N/A 06/04/2015   Procedure: COLONOSCOPY WITH PROPOFOL;  Surgeon: Midge Minium, MD;  5 tubular adenomas  COLONOSCOPY WITH PROPOFOL N/A 11/22/2021   Procedure: COLONOSCOPY WITH PROPOFOL;  Surgeon: Midge Minium, MD;  Location: [facility];  Service: Endoscopy;  Laterality: N/A;  Diabetic   HIP ARTHROPLASTY Left 2013   LEG SKIN LESION  BIOPSY / EXCISION Right 2015   Hidradenitis   POLYPECTOMY  06/04/2015   Procedure: POLYPECTOMY;  Surgeon: Midge Minium, MD;  Location: [facility];  Service: Endoscopy;;   POLYPECTOMY  11/22/2021   Procedure: POLYPECTOMY;  Surgeon: Midge Minium, MD;  Location: [facility];  Service: Endoscopy;;   VARICOSE VEIN SURGERY Bilateral 2015   VIDEO ASSISTED THORACOSCOPY (VATS)/ LOBECTOMY Right 07/28/2022    Social History   Tobacco Use   Smoking status: Every Day    Packs/day: 0.25    Years: 30.00    Additional pack years: 0.00    Total  pack years: 7.50    Types: Cigarettes   Smokeless tobacco: Never  Vaping Use   Vaping Use: Never used  Substance Use Topics   Alcohol use: No    Alcohol/week: 4.0 standard drinks of alcohol    Types: 4 Standard drinks or equivalent per week   Drug use: No     Medication list has been reviewed and updated.  Current Meds  Medication Sig   acetaminophen (TYLENOL) 500 MG tablet Take 500 mg by mouth every 6 (six) hours as needed.   Adalimumab 40 MG/0.8ML PSKT Inject 160mg  (4 syringes) week 0, 80mg  (2 syringes) week 2 and 80mg  (1 syringe) week 4 and every week thereafter   allopurinol (ZYLOPRIM) 100 MG tablet Take 1 tablet (100 mg total) by mouth daily.   amiodarone (PACERONE) 200 MG tablet Take 1 tablet by mouth daily.   clindamycin (CLEOCIN T) 1 % lotion Apply topically 2 (two) times daily.   cyclobenzaprine (FLEXERIL) 5 MG tablet Take 5 mg by mouth 3 (three) times daily as needed.   diltiazem (CARDIZEM CD) 240 MG 24 hr capsule Take 240 mg by mouth daily.   Docusate Sodium (DSS) 100 MG CAPS Take by mouth.   doxycycline (ADOXA) 100 MG tablet Take by mouth.   furosemide (LASIX) 40 MG tablet Take 1 tablet by mouth daily.   glucose blood (FREESTYLE LITE) test strip Use as instructed   hydroxychloroquine (PLAQUENIL) 200 MG tablet Take 200 mg by mouth daily.   Lancets (FREESTYLE) lancets Use as instructed   losartan-hydrochlorothiazide (HYZAAR) 100-25 MG tablet TAKE 1 TABLET BY MOUTH EVERY DAY   metoprolol succinate (TOPROL-XL) 100 MG 24 hr tablet Take by mouth daily as needed.   Multiple Vitamin (MULTIVITAMIN WITH MINERALS) TABS tablet Take 1 tablet by mouth daily. Complete Multivitamin   oxyCODONE (OXY IR/ROXICODONE) 5 MG immediate release tablet Take 1 tablet (5 mg total) by mouth every 4 (four) hours as needed for severe pain.   pravastatin (PRAVACHOL) 40 MG tablet TAKE 1 TABLET BY MOUTH EVERY DAY   rivaroxaban (XARELTO) 20 MG TABS tablet Take 1 tablet by mouth daily with breakfast.    tamsulosin (FLOMAX) 0.4 MG CAPS capsule Take 1 tablet by mouth daily.   [DISCONTINUED] Semaglutide, 1 MG/DOSE, (OZEMPIC, 1 MG/DOSE,) 4 MG/3ML SOPN Inject 1 mg into the skin once a week.       08/15/2022    2:02 PM 05/14/2022    9:21 AM 09/04/2021    9:03 AM 05/06/2021   11:03 AM  GAD 7 : Generalized Anxiety Score  Nervous, Anxious, on Edge 0 0 0 0  Control/stop worrying 0 0 0 0  Worry too much - different things 0 0 0 0  Trouble relaxing 0 0 0 0  Restless 0 0 0 0  Easily annoyed or irritable 0 0 0 0  Afraid - awful might happen 0 0 0 0  Total GAD 7 Score 0 0 0 0  Anxiety Difficulty Not difficult at all Not difficult at all Not difficult at all        08/15/2022    2:02 PM 05/14/2022    9:20 AM 09/04/2021    9:02 AM  Depression screen PHQ 2/9  Decreased Interest 0 0 0  Down, Depressed, Hopeless 0 0 0  PHQ - 2 Score 0 0 0  Altered sleeping 1 2 0  Tired, decreased energy 3 2 0  Change in appetite 0 0 0  Feeling bad or failure about yourself  0 0 0  Trouble concentrating 0 0 0  Moving slowly or fidgety/restless 0 0 0  Suicidal thoughts 0 0 0  PHQ-9 Score 4 4 0  Difficult doing work/chores Not difficult at all Not difficult at all Not difficult at all    BP Readings from Last 3 Encounters:  08/15/22 122/76  05/14/22 138/70  11/22/21 113/78    Physical Exam Constitutional:      Appearance: He is obese.  Cardiovascular:     Rate and Rhythm: Normal rate and regular rhythm.  Pulmonary:     Effort: Pulmonary effort is normal.     Breath sounds: No wheezing or rhonchi.  Chest:     Comments: Thoroscopic site on back and axillary incision healing; lower thoroscopic site with sutures slightly dehisced but not bleeding or draining Musculoskeletal:     Cervical back: Normal range of motion.     Right lower leg: No edema.     Left lower leg: No edema.  Lymphadenopathy:     Cervical: No cervical adenopathy.  Neurological:     Mental Status: He is alert.     Wt Readings from  Last 3 Encounters:  08/15/22 292 lb (132.5 kg)  05/14/22 295 lb (133.8 kg)  11/22/21 271 lb (122.9 kg)    BP 122/76   Pulse 64   Ht 6' (1.829 m)   Wt 292 lb (132.5 kg)   SpO2 95%   BMI 39.60 kg/m   Assessment and Plan:  Problem List Items Addressed This Visit       Respiratory   Adenocarcinoma of right lung (HCC) - Primary (Chronic)    Underwent thoracoscopy and right lobectomy 07/2022 at [facility] Hopeful for complete excision - has follow up 5/24 Will give a few oxycodone for ongoing chest pain        Endocrine   Type II diabetes mellitus with complication (HCC) (Chronic)    DM treated with Ozempic. Metformin stopped during hospital stay for lung cancer surgery 2/2 [facility]. Will recheck BMP and A1c today      Relevant Medications   Semaglutide, 1 MG/DOSE, (OZEMPIC, 1 MG/DOSE,) 4 MG/3ML SOPN   Other Relevant Orders   Hemoglobin A1c     Genitourinary   Stage 3b chronic kidney disease (HCC) (Chronic)    ARF with Cr increase to 4.7 after lung surgery. Treated with fluids and Lasix; Cr improved to 2.15 at discharge. Scheduled to see Nephrology on 5/22      Relevant Orders   Basic metabolic panel   Other Visit Diagnoses     Post-operative pain       Relevant Medications   oxyCODONE (OXY  IR/ROXICODONE) 5 MG immediate release tablet       Return in about 3 months (around 11/15/2022) for DM.   Partially dictated using Dragon software, any errors are not intentional.  Reubin Milan, MD [facility] [place], [place]

## 2022-08-15 NOTE — Assessment & Plan Note (Addendum)
Underwent thoracoscopy and right lobectomy 07/2022 at [facility]. Hopeful for complete excision - has follow up 5/24 Will give a few oxycodone for ongoing chest pain

## 2022-08-15 NOTE — Assessment & Plan Note (Addendum)
ARF with Cr increase to 4.7 after lung surgery. Treated with fluids and Lasix; Cr improved to 2.15 at discharge. Scheduled to see Nephrology on 5/22

## 2022-08-16 LAB — BASIC METABOLIC PANEL
BUN/Creatinine Ratio: 8 — ABNORMAL LOW (ref 10–24)
BUN: 15 mg/dL (ref 8–27)
CO2: 20 mmol/L (ref 20–29)
Calcium: 9.7 mg/dL (ref 8.6–10.2)
Chloride: 99 mmol/L (ref 96–106)
Creatinine, Ser: 1.78 mg/dL — ABNORMAL HIGH (ref 0.76–1.27)
Glucose: 117 mg/dL — ABNORMAL HIGH (ref 70–99)
Potassium: 4.7 mmol/L (ref 3.5–5.2)
Sodium: 136 mmol/L (ref 134–144)
eGFR: 43 mL/min/{1.73_m2} — ABNORMAL LOW (ref >59)

## 2022-08-16 LAB — HEMOGLOBIN A1C
Est. average glucose Bld gHb Est-mCnc: 183 mg/dL
Hgb A1c MFr Bld: 8 % — ABNORMAL HIGH (ref 4.8–5.6)

## 2022-08-18 ENCOUNTER — Other Ambulatory Visit: Payer: Self-pay

## 2022-08-18 DIAGNOSIS — E118 Type 2 diabetes mellitus with unspecified complications: Secondary | ICD-10-CM

## 2022-08-18 MED ORDER — SEMAGLUTIDE (2 MG/DOSE) 8 MG/3ML ~~LOC~~ SOPN
2.0000 mg | PEN_INJECTOR | SUBCUTANEOUS | 2 refills | Status: DC
Start: 2022-08-18 — End: 2023-05-22

## 2022-08-18 NOTE — Progress Notes (Signed)
PC to pt, discussed labs and sent in medication.

## 2022-09-11 LAB — PSA: PSA: 0.12

## 2022-09-24 DIAGNOSIS — Z902 Acquired absence of lung [part of]: Secondary | ICD-10-CM | POA: Insufficient documentation

## 2022-10-23 ENCOUNTER — Ambulatory Visit (INDEPENDENT_AMBULATORY_CARE_PROVIDER_SITE_OTHER): Payer: Medicare HMO

## 2022-10-23 VITALS — Ht 72.0 in | Wt 292.0 lb

## 2022-10-23 DIAGNOSIS — Z0000 Encounter for general adult medical examination without abnormal findings: Secondary | ICD-10-CM

## 2022-10-23 NOTE — Patient Instructions (Signed)
Jon Mendez , Thank you for taking time to come for your Medicare Wellness Visit. I appreciate your ongoing commitment to your health goals. Please review the following plan we discussed and let me know if I can assist you in the future.   These are the goals we discussed:  Goals      DIET - EAT MORE FRUITS AND VEGETABLES     Weight (lb) < 200 lb (90.7 kg)        This is a list of the screening recommended for you and due dates:  Health Maintenance  Topic Date Due   Zoster (Shingles) Vaccine (1 of 2) Never done   DTaP/Tdap/Td vaccine (2 - Td or Tdap) 04/08/2021   COVID-19 Vaccine (4 - 2023-24 season) 12/06/2021   Eye exam for diabetics  01/18/2022   Flu Shot  11/06/2022   Hemoglobin A1C  02/15/2023   Yearly kidney health urinalysis for diabetes  05/15/2023   Complete foot exam   05/15/2023   Yearly kidney function blood test for diabetes  08/15/2023   Medicare Annual Wellness Visit  10/23/2023   Colon Cancer Screening  11/22/2024   Pneumococcal Vaccination  Completed   Hepatitis C Screening  Completed   HPV Vaccine  Aged Out    Advanced directives: no  Conditions/risks identified: none  Next appointment: Follow up in one year for your annual wellness visit 10/28/23 @ 3:15 pm by phone  Preventive Care 40-64 Years, Male Preventive care refers to lifestyle choices and visits with your health care provider that can promote health and wellness. What does preventive care include? A yearly physical exam. This is also called an annual well check. Dental exams once or twice a year. Routine eye exams. Ask your health care provider how often you should have your eyes checked. Personal lifestyle choices, including: Daily care of your teeth and gums. Regular physical activity. Eating a healthy diet. Avoiding tobacco and drug use. Limiting alcohol use. Practicing safe sex. Taking low-dose aspirin every day starting at age 63. What happens during an annual well check? The services  and screenings done by your health care provider during your annual well check will depend on your age, overall health, lifestyle risk factors, and family history of disease. Counseling  Your health care provider may ask you questions about your: Alcohol use. Tobacco use. Drug use. Emotional well-being. Home and relationship well-being. Sexual activity. Eating habits. Work and work Astronomer. Screening  You may have the following tests or measurements: Height, weight, and BMI. Blood pressure. Lipid and cholesterol levels. These may be checked every 5 years, or more frequently if you are over 84 years old. Skin check. Lung cancer screening. You may have this screening every year starting at age 82 if you have a 30-pack-year history of smoking and currently smoke or have quit within the past 15 years. Fecal occult blood test (FOBT) of the stool. You may have this test every year starting at age 15. Flexible sigmoidoscopy or colonoscopy. You may have a sigmoidoscopy every 5 years or a colonoscopy every 10 years starting at age 55. Prostate cancer screening. Recommendations will vary depending on your family history and other risks. Hepatitis C blood test. Hepatitis B blood test. Sexually transmitted disease (STD) testing. Diabetes screening. This is done by checking your blood sugar (glucose) after you have not eaten for a while (fasting). You may have this done every 1-3 years. Discuss your test results, treatment options, and if necessary, the need for more tests  with your health care provider. Vaccines  Your health care provider may recommend certain vaccines, such as: Influenza vaccine. This is recommended every year. Tetanus, diphtheria, and acellular pertussis (Tdap, Td) vaccine. You may need a Td booster every 10 years. Zoster vaccine. You may need this after age 91. Pneumococcal 13-valent conjugate (PCV13) vaccine. You may need this if you have certain conditions and have not  been vaccinated. Pneumococcal polysaccharide (PPSV23) vaccine. You may need one or two doses if you smoke cigarettes or if you have certain conditions. Talk to your health care provider about which screenings and vaccines you need and how often you need them. This information is not intended to replace advice given to you by your health care provider. Make sure you discuss any questions you have with your health care provider. Document Released: 04/20/2015 Document Revised: 12/12/2015 Document Reviewed: 01/23/2015 Elsevier Interactive Patient Education  2017 [employer].  Fall Prevention in the Home Falls can cause injuries. They can happen to people of all ages. There are many things you can do to make your home safe and to help prevent falls. What can I do on the outside of my home? Regularly fix the edges of walkways and driveways and fix any cracks. Remove anything that might make you trip as you walk through a door, such as a raised step or threshold. Trim any bushes or trees on the path to your home. Use bright outdoor lighting. Clear any walking paths of anything that might make someone trip, such as rocks or tools. Regularly check to see if handrails are loose or broken. Make sure that both sides of any steps have handrails. Any raised decks and porches should have guardrails on the edges. Have any leaves, snow, or ice cleared regularly. Use sand or salt on walking paths during winter. Clean up any spills in your garage right away. This includes oil or grease spills. What can I do in the bathroom? Use night lights. Install grab bars by the toilet and in the tub and shower. Do not use towel bars as grab bars. Use non-skid mats or decals in the tub or shower. If you need to sit down in the shower, use a plastic, non-slip stool. Keep the floor dry. Clean up any water that spills on the floor as soon as it happens. Remove soap buildup in the tub or shower regularly. Attach bath mats  securely with double-sided non-slip rug tape. Do not have throw rugs and other things on the floor that can make you trip. What can I do in the bedroom? Use night lights. Make sure that you have a light by your bed that is easy to reach. Do not use any sheets or blankets that are too big for your bed. They should not hang down onto the floor. Have a firm chair that has side arms. You can use this for support while you get dressed. Do not have throw rugs and other things on the floor that can make you trip. What can I do in the kitchen? Clean up any spills right away. Avoid walking on wet floors. Keep items that you use a lot in easy-to-reach places. If you need to reach something above you, use a strong step stool that has a grab bar. Keep electrical cords out of the way. Do not use floor polish or wax that makes floors slippery. If you must use wax, use non-skid floor wax. Do not have throw rugs and other things on the floor that  can make you trip. What can I do with my stairs? Do not leave any items on the stairs. Make sure that there are handrails on both sides of the stairs and use them. Fix handrails that are broken or loose. Make sure that handrails are as long as the stairways. Check any carpeting to make sure that it is firmly attached to the stairs. Fix any carpet that is loose or worn. Avoid having throw rugs at the top or bottom of the stairs. If you do have throw rugs, attach them to the floor with carpet tape. Make sure that you have a light switch at the top of the stairs and the bottom of the stairs. If you do not have them, ask someone to add them for you. What else can I do to help prevent falls? Wear shoes that: Do not have high heels. Have rubber bottoms. Are comfortable and fit you well. Are closed at the toe. Do not wear sandals. If you use a stepladder: Make sure that it is fully opened. Do not climb a closed stepladder. Make sure that both sides of the stepladder  are locked into place. Ask someone to hold it for you, if possible. Clearly mark and make sure that you can see: Any grab bars or handrails. First and last steps. Where the edge of each step is. Use tools that help you move around (mobility aids) if they are needed. These include: Canes. Walkers. Scooters. Crutches. Turn on the lights when you go into a dark area. Replace any light bulbs as soon as they burn out. Set up your furniture so you have a clear path. Avoid moving your furniture around. If any of your floors are uneven, fix them. If there are any pets around you, be aware of where they are. Review your medicines with your doctor. Some medicines can make you feel dizzy. This can increase your chance of falling. Ask your doctor what other things that you can do to help prevent falls. This information is not intended to replace advice given to you by your health care provider. Make sure you discuss any questions you have with your health care provider. Document Released: 01/18/2009 Document Revised: 08/30/2015 Document Reviewed: 04/28/2014 Elsevier Interactive Patient Education  2017 [employer].

## 2022-10-23 NOTE — Progress Notes (Signed)
Subjective:   Jon Mendez is a 62 y.o. male who presents for an Initial Medicare Annual Wellness Visit.  Per patient no change in vitals since last visit, unable to obtain new vitals due to telehealth visit   Visit Complete: Virtual  I connected with  Jon Mendez on 10/23/22 by a audio enabled telemedicine application and verified that I am speaking with the correct person using two identifiers.  Patient Location: Home  Provider Location: Office/Clinic  I discussed the limitations of evaluation and management by telemedicine. The patient expressed understanding and agreed to proceed.   Review of Systems     Cardiac Risk Factors include: advanced age (>52men, >35 women);diabetes mellitus;dyslipidemia;hypertension;male gender;obesity (BMI >30kg/m2)     Objective:    Today's Vitals   10/23/22 0849  Weight: 292 lb (132.5 kg)  Height: 6' (1.829 m)   Body mass index is 39.6 kg/m.     10/23/2022    8:39 AM 11/22/2021    9:38 AM 06/17/2018    7:06 AM 05/04/2018   12:42 AM 05/03/2018    8:57 PM 05/03/2018    6:33 PM 01/12/2017   11:12 AM  Advanced Directives  Does Patient Have a Medical Advance Directive? No No No No No No No  Would patient like information on creating a medical advance directive? No - Patient declined No - Patient declined Yes (MAU/Ambulatory/Procedural Areas - Information given) No - Patient declined No - Patient declined  No - Patient declined    Current Medications (verified) Outpatient Encounter Medications as of 10/23/2022  Medication Sig   Adalimumab 40 MG/0.8ML PSKT Inject 160mg  (4 syringes) week 0, 80mg  (2 syringes) week 2 and 80mg  (1 syringe) week 4 and every week thereafter   allopurinol (ZYLOPRIM) 100 MG tablet Take 1 tablet (100 mg total) by mouth daily.   amiodarone (PACERONE) 200 MG tablet Take 1 tablet by mouth daily.   clindamycin (CLEOCIN T) 1 % lotion Apply topically 2 (two) times daily.   cyclobenzaprine (FLEXERIL) 5 MG tablet  Take 5 mg by mouth 3 (three) times daily as needed.   diltiazem (CARDIZEM CD) 240 MG 24 hr capsule Take 240 mg by mouth daily.   glucose blood (FREESTYLE LITE) test strip Use as instructed   hydroxychloroquine (PLAQUENIL) 200 MG tablet Take 200 mg by mouth daily.   Lancets (FREESTYLE) lancets Use as instructed   losartan-hydrochlorothiazide (HYZAAR) 100-25 MG tablet TAKE 1 TABLET BY MOUTH EVERY DAY   Multiple Vitamin (MULTIVITAMIN WITH MINERALS) TABS tablet Take 1 tablet by mouth daily. Complete Multivitamin   oxyCODONE (OXY IR/ROXICODONE) 5 MG immediate release tablet Take 1 tablet (5 mg total) by mouth every 4 (four) hours as needed for severe pain.   pravastatin (PRAVACHOL) 40 MG tablet TAKE 1 TABLET BY MOUTH EVERY DAY   rivaroxaban (XARELTO) 20 MG TABS tablet Take 1 tablet by mouth daily with breakfast.   Semaglutide, 1 MG/DOSE, (OZEMPIC, 1 MG/DOSE,) 4 MG/3ML SOPN Inject 1 mg into the skin once a week.   Semaglutide, 2 MG/DOSE, 8 MG/3ML SOPN Inject 2 mg as directed once a week.   tamsulosin (FLOMAX) 0.4 MG CAPS capsule Take 1 tablet by mouth daily.   furosemide (LASIX) 40 MG tablet Take 1 tablet by mouth daily.   No facility-administered encounter medications on file as of 10/23/2022.    Allergies (verified) Egg-derived products, Fish allergy, and Acyclovir and related   History: Past Medical History:  Diagnosis Date   CKD (chronic kidney disease), stage III (  HCC)    Diabetes mellitus without complication (HCC)    H/O hidradenitis suppurativa    H/O varicose veins    High cholesterol    Hypertension    Prostate CA [facility])    Vertigo    Past Surgical History:  Procedure Laterality Date   CARDIAC ELECTROPHYSIOLOGY MAPPING AND ABLATION  07/2021   CARDIOVERSION N/A 06/17/2018   Procedure: CARDIOVERSION (CATH LAB);  Surgeon: Antonieta Iba, MD;  Location: [facility] ORS;  Service: Cardiovascular;  Laterality: N/A;   COLONOSCOPY WITH PROPOFOL N/A 06/04/2015   Procedure: COLONOSCOPY  WITH PROPOFOL;  Surgeon: Midge Minium, MD;  5 tubular adenomas   COLONOSCOPY WITH PROPOFOL N/A 11/22/2021   Procedure: COLONOSCOPY WITH PROPOFOL;  Surgeon: Midge Minium, MD;  Location: [facility];  Service: Endoscopy;  Laterality: N/A;  Diabetic   HIP ARTHROPLASTY Left 2013   LEG SKIN LESION  BIOPSY / EXCISION Right 2015   Hidradenitis   POLYPECTOMY  06/04/2015   Procedure: POLYPECTOMY;  Surgeon: Midge Minium, MD;  Location: [facility];  Service: Endoscopy;;   POLYPECTOMY  11/22/2021   Procedure: POLYPECTOMY;  Surgeon: Midge Minium, MD;  Location: [facility];  Service: Endoscopy;;   VARICOSE VEIN SURGERY Bilateral 2015   VIDEO ASSISTED THORACOSCOPY (VATS)/ LOBECTOMY Right 07/28/2022   Family History  Problem Relation Age of Onset   Hypertension Mother    Lymphoma Father    Diabetes Sister    Diabetes Brother    Diabetes Brother    Social History   Socioeconomic History   Marital status: Married    Spouse name: Not on file   Number of children: Not on file   Years of education: Not on file   Highest education level: Not on file  Occupational History   Not on file  Tobacco Use   Smoking status: Every Day    Current packs/day: 0.25    Average packs/day: 0.3 packs/day for 30.0 years (7.5 ttl pk-yrs)    Types: Cigarettes   Smokeless tobacco: Never  Vaping Use   Vaping status: Never Used  Substance and Sexual Activity   Alcohol use: No    Alcohol/week: 4.0 standard drinks of alcohol    Types: 4 Standard drinks or equivalent per week   Drug use: No   Sexual activity: Yes  Other Topics Concern   Not on file  Social History Narrative   Not on file   Social Determinants of Health   Financial Resource Strain: Low Risk  (10/23/2022)   Overall Financial Resource Strain (CARDIA)    Difficulty of Paying Living Expenses: Not very hard  Recent Concern: Financial Resource Strain - Medium Risk (07/31/2022)   Received from [facility]    Overall Financial Resource Strain (CARDIA)    Difficulty of Paying Living Expenses: Somewhat hard  Food Insecurity: No Food Insecurity (10/23/2022)   Hunger Vital Sign    Worried About Running Out of Food in the Last Year: Never true    Ran Out of Food in the Last Year: Never true  Transportation Needs: No Transportation Needs (10/23/2022)   PRAPARE - Administrator, Civil Service (Medical): No    Lack of Transportation (Non-Medical): No  Physical Activity: Insufficiently Active (10/23/2022)   Exercise Vital Sign    Days of Exercise per Week: 3 days    Minutes of Exercise per Session: 30 min  Stress: No Stress Concern Present (10/23/2022)   [employer] of Occupational Health - Occupational Stress  Questionnaire    Feeling of Stress : Not at all  Social Connections: Moderately Integrated (10/23/2022)   Social Connection and Isolation Panel [NHANES]    Frequency of Communication with Friends and Family: More than three times a week    Frequency of Social Gatherings with Friends and Family: Twice a week    Attends Religious Services: More than 4 times per year    Active Member of [employer] or Organizations: No    Attends Engineer, structural: Never    Marital Status: Married    Tobacco Counseling Ready to quit: Not Answered Counseling given: Not Answered   Clinical Intake:  Pre-visit preparation completed: Yes  Pain : No/denies pain     Nutritional Risks: None Diabetes: Yes CBG done?: No Did pt. bring in CBG monitor from home?: No  How often do you need to have someone help you when you read instructions, pamphlets, or other written materials from your doctor or pharmacy?: 1 - Never  Interpreter Needed?: No  Information entered by :: Kennedy Bucker, LPN   Activities of Daily Living    10/23/2022    8:40 AM 11/22/2021    9:40 AM  In your present state of health, do you have any difficulty performing the following activities:  Hearing? 0 0  Vision? 0  0  Difficulty concentrating or making decisions? 0 0  Walking or climbing stairs? 0 0  Dressing or bathing? 0 0  Doing errands, shopping? 0   Preparing Food and eating ? N   Using the Toilet? N   In the past six months, have you accidently leaked urine? N   Do you have problems with loss of bowel control? N   Managing your Medications? N   Managing your Finances? N   Housekeeping or managing your Housekeeping? N     Patient Care Team: Reubin Milan, MD as PCP - General (Internal Medicine) Antonieta Iba, MD as Consulting Physician (Cardiology) Kristin Bruins, MD as Referring Physician (Ophthalmology) Midge Minium, MD as Consulting Physician (Gastroenterology)  Indicate any recent Medical Services you may have received from other than [facility] providers in the past year (date may be approximate).     Assessment:   This is a routine wellness examination for Ruslan.  Hearing/Vision screen Hearing Screening - Comments:: No aids Vision Screening - Comments:: Wears glasses- MD in [facility]  Dietary issues and exercise activities discussed:     Goals Addressed             This Visit's Progress    DIET - EAT MORE FRUITS AND VEGETABLES         Depression Screen    10/23/2022    8:38 AM 08/15/2022    2:02 PM 05/14/2022    9:20 AM 09/04/2021    9:02 AM 05/06/2021   11:03 AM 12/04/2020    1:39 PM 10/24/2020    2:18 PM  PHQ 2/9 Scores  PHQ - 2 Score 0 0 0 0 0 0 0  PHQ- 9 Score 0 4 4 0 0 0 3    Fall Risk    10/23/2022    8:40 AM 08/15/2022    2:02 PM 05/14/2022    9:21 AM 09/04/2021    9:03 AM 05/06/2021   11:04 AM  Fall Risk   Falls in the past year? 0 0 0 0 0  Number falls in past yr: 0 1 0 0 0  Injury with Fall? 0 0 0 0  0  Risk for fall due to : No Fall Risks No Fall Risks No Fall Risks No Fall Risks No Fall Risks  Follow up Falls prevention discussed;Falls evaluation completed Falls evaluation completed Falls evaluation completed Falls evaluation  completed Falls evaluation completed    MEDICARE RISK AT HOME:  Medicare Risk at Home - 10/23/22 0840     Any stairs in or around the home? No    If so, are there any without handrails? No    Home free of loose throw rugs in walkways, pet beds, electrical cords, etc? Yes    Adequate lighting in your home to reduce risk of falls? Yes    Life alert? No    Use of a cane, walker or w/c? No    Grab bars in the bathroom? No    Shower chair or bench in shower? Yes    Elevated toilet seat or a handicapped toilet? Yes             TIMED UP AND GO:  Was the test performed? No    Cognitive Function:        10/23/2022    8:42 AM  6CIT Screen  What Year? 0 points  What month? 0 points  What time? 0 points  Count back from 20 0 points  Months in reverse 0 points  Repeat phrase 0 points  Total Score 0 points    Immunizations Immunization History  Administered Date(s) Administered   PFIZER(Purple Top)SARS-COV-2 Vaccination 06/23/2019, 07/14/2019, 03/06/2020   PNEUMOCOCCAL CONJUGATE-20 05/14/2022   Pneumococcal Polysaccharide-23 04/28/2016   Tdap 04/09/2011    TDAP status: Due, Education has been provided regarding the importance of this vaccine. Advised may receive this vaccine at local pharmacy or Health Dept. Aware to provide a copy of the vaccination record if obtained from local pharmacy or Health Dept. Verbalized acceptance and understanding.  Flu Vaccine status: Declined, Education has been provided regarding the importance of this vaccine but patient still declined. Advised may receive this vaccine at local pharmacy or Health Dept. Aware to provide a copy of the vaccination record if obtained from local pharmacy or Health Dept. Verbalized acceptance and understanding.  Pneumococcal vaccine status: Up to date  Covid-19 vaccine status: Completed vaccines  Qualifies for Shingles Vaccine? Yes   Zostavax completed No   Shingrix Completed?: No.    Education has been  provided regarding the importance of this vaccine. Patient has been advised to call insurance company to determine out of pocket expense if they have not yet received this vaccine. Advised may also receive vaccine at local pharmacy or Health Dept. Verbalized acceptance and understanding.  Screening Tests Health Maintenance  Topic Date Due   Zoster Vaccines- Shingrix (1 of 2) Never done   DTaP/Tdap/Td (2 - Td or Tdap) 04/08/2021   COVID-19 Vaccine (4 - 2023-24 season) 12/06/2021   OPHTHALMOLOGY EXAM  01/18/2022   INFLUENZA VACCINE  11/06/2022   HEMOGLOBIN A1C  02/15/2023   Diabetic kidney evaluation - Urine ACR  05/15/2023   FOOT EXAM  05/15/2023   Diabetic kidney evaluation - eGFR measurement  08/15/2023   Medicare Annual Wellness (AWV)  10/23/2023   Colonoscopy  11/22/2024   Pneumococcal Vaccine 55-67 Years old  Completed   Hepatitis C Screening  Completed   HPV VACCINES  Aged Out    Health Maintenance  Health Maintenance Due  Topic Date Due   Zoster Vaccines- Shingrix (1 of 2) Never done   DTaP/Tdap/Td (2 - Td or Tdap)  04/08/2021   COVID-19 Vaccine (4 - 2023-24 season) 12/06/2021   OPHTHALMOLOGY EXAM  01/18/2022    Colorectal cancer screening: Type of screening: Colonoscopy. Completed 11/22/21. Repeat every 3 years  Lung Cancer Screening: (Low Dose CT Chest recommended if Age 48-80 years, 20 pack-year currently smoking OR have quit w/in 15years.) does qualify.   Lung Cancer Screening Referral: has lung cancer- followed by oncology  Additional Screening:  Hepatitis C Screening: does qualify; Completed 04/25/15  Vision Screening: Recommended annual ophthalmology exams for early detection of glaucoma and other disorders of the eye. Is the patient up to date with their annual eye exam?  Yes  Who is the provider or what is the name of the office in which the patient attends annual eye exams? MD at [facility] If pt is not established with a provider, would they like to be  referred to a provider to establish care? No .   Dental Screening: Recommended annual dental exams for proper oral hygiene  Diabetic Foot Exam: Diabetic Foot Exam: Completed 05/14/22  Community Resource Referral / Chronic Care Management: CRR required this visit?  No   CCM required this visit?  No    Plan:     I have personally reviewed and noted the following in the patient's chart:   Medical and social history Use of alcohol, tobacco or illicit drugs  Current medications and supplements including opioid prescriptions. Patient is currently taking opioid prescriptions. Information provided to patient regarding non-opioid alternatives. Patient advised to discuss non-opioid treatment plan with their provider. Functional ability and status Nutritional status Physical activity Advanced directives List of other physicians Hospitalizations, surgeries, and ER visits in previous 12 months Vitals Screenings to include cognitive, depression, and falls Referrals and appointments  In addition, I have reviewed and discussed with patient certain preventive protocols, quality metrics, and best practice recommendations. A written personalized care plan for preventive services as well as general preventive health recommendations were provided to patient.     Hal Hope, LPN   [id number]   After Visit Summary: (MyChart) Due to this being a telephonic visit, the after visit summary with patients personalized plan was offered to patient via MyChart   Nurse Notes: none

## 2022-11-17 ENCOUNTER — Ambulatory Visit (INDEPENDENT_AMBULATORY_CARE_PROVIDER_SITE_OTHER): Payer: Medicare HMO | Admitting: Internal Medicine

## 2022-11-17 ENCOUNTER — Encounter: Payer: Self-pay | Admitting: Internal Medicine

## 2022-11-17 VITALS — BP 112/70 | HR 94 | Ht 72.0 in | Wt 283.0 lb

## 2022-11-17 DIAGNOSIS — C3491 Malignant neoplasm of unspecified part of right bronchus or lung: Secondary | ICD-10-CM

## 2022-11-17 DIAGNOSIS — Z7985 Long-term (current) use of injectable non-insulin antidiabetic drugs: Secondary | ICD-10-CM | POA: Diagnosis not present

## 2022-11-17 DIAGNOSIS — M1000 Idiopathic gout, unspecified site: Secondary | ICD-10-CM | POA: Diagnosis not present

## 2022-11-17 DIAGNOSIS — L732 Hidradenitis suppurativa: Secondary | ICD-10-CM

## 2022-11-17 DIAGNOSIS — I10 Essential (primary) hypertension: Secondary | ICD-10-CM

## 2022-11-17 DIAGNOSIS — M05772 Rheumatoid arthritis with rheumatoid factor of left ankle and foot without organ or systems involvement: Secondary | ICD-10-CM | POA: Diagnosis not present

## 2022-11-17 DIAGNOSIS — E118 Type 2 diabetes mellitus with unspecified complications: Secondary | ICD-10-CM

## 2022-11-17 MED ORDER — ALLOPURINOL 100 MG PO TABS
100.0000 mg | ORAL_TABLET | Freq: Every day | ORAL | 0 refills | Status: DC
Start: 2022-11-17 — End: 2022-12-15

## 2022-11-17 MED ORDER — DOXYCYCLINE HYCLATE 100 MG PO TABS
100.0000 mg | ORAL_TABLET | Freq: Two times a day (BID) | ORAL | 0 refills | Status: AC
Start: 2022-11-17 — End: 2022-11-27

## 2022-11-17 NOTE — Assessment & Plan Note (Addendum)
Blood sugars stable without hypoglycemic symptoms or events. Current regimen is Ozempic 2 mg weekly but he could not tolerate it.  He resumed 1 mg dosing. Nephrology added Glimepiride 1 mg. Changes made last visit are increasing ozempic dose which he could not tolerate. Lab Results  Component Value Date   HGBA1C 8.0 (H) 08/15/2022

## 2022-11-17 NOTE — Assessment & Plan Note (Signed)
Now on Plaquenil 200 mg per day

## 2022-11-17 NOTE — Progress Notes (Signed)
Date:  11/17/2022   Name:  Jon Mendez   DOB:  January 26, 1961   MRN:  [id number]   Chief Complaint: Diabetes  Diabetes He presents for his follow-up diabetic visit. He has type 2 diabetes mellitus. His disease course has been stable. Pertinent negatives for hypoglycemia include no headaches, nervousness/anxiousness or tremors. Pertinent negatives for diabetes include no chest pain, no fatigue, no polydipsia and no polyuria. Current diabetic treatments: ozempic. He is compliant with treatment most of the time. An ACE inhibitor/angiotensin II receptor blocker is being taken.  Hypertension This is a chronic problem. The problem is controlled. Associated symptoms include shortness of breath. Pertinent negatives include no chest pain, headaches or palpitations. Past treatments include angiotensin blockers, diuretics and calcium channel blockers. The current treatment provides significant improvement.  Lung Cancer - s/p lobectomy - healing slowly, some shortness of breath but overall improving.  Follow up CT done yesterday and seeing Surgical oncology tomorrow. HS - on Humira with improvement but still get small flares.  Currently has an open lesion on the lower abdomen. RA - being followed by Rheumatology; recently put on Plaquenil.  Lab Results  Component Value Date   NA 136 08/15/2022   K 4.7 08/15/2022   CO2 20 08/15/2022   GLUCOSE 117 (H) 08/15/2022   BUN 15 08/15/2022   CREATININE 1.78 (H) 08/15/2022   CALCIUM 9.7 08/15/2022   EGFR 43 (L) 08/15/2022   GFRNONAA 33 (L) 03/05/2020   Lab Results  Component Value Date   CHOL 222 (H) 05/14/2022   HDL 46 05/14/2022   LDLCALC 137 (H) 05/14/2022   TRIG 220 (H) 05/14/2022   CHOLHDL 4.8 05/14/2022   Lab Results  Component Value Date   TSH 2.430 05/06/2021   Lab Results  Component Value Date   HGBA1C 8.0 (H) 08/15/2022   Lab Results  Component Value Date   WBC 10.8 08/04/2022   HGB 9.2 (A) 08/04/2022   HCT 28 (A) 08/04/2022    MCV 70 (L) 09/04/2021   PLT 258 08/04/2022   Lab Results  Component Value Date   ALT 20 05/14/2022   AST 22 05/14/2022   ALKPHOS 121 05/14/2022   BILITOT 0.2 05/14/2022   No results found for: "25OHVITD2", "25OHVITD3", "VD25OH"   Review of Systems  Constitutional:  Negative for appetite change, fatigue and unexpected weight change.  HENT:  Negative for trouble swallowing.   Eyes:  Negative for visual disturbance.  Respiratory:  Positive for shortness of breath. Negative for cough and wheezing.   Cardiovascular:  Negative for chest pain, palpitations and leg swelling.  Gastrointestinal:  Negative for abdominal pain and blood in stool.  Endocrine: Negative for polydipsia and polyuria.  Genitourinary:  Negative for dysuria and hematuria.  Musculoskeletal:  Positive for arthralgias and myalgias.  Skin:  Positive for wound. Negative for color change and rash.  Neurological:  Negative for tremors, numbness and headaches.  Psychiatric/Behavioral:  Negative for dysphoric mood and sleep disturbance. The patient is not nervous/anxious.     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   Hip pain, chronic, right 10/24/2020   Acquired thrombophilia (HCC) 06/20/2020   Type II diabetes mellitus with complication (HCC) 09/19/2015   Tubular adenoma of colon    Stage 3b chronic kidney disease (HCC) 04/25/2015   Chronic gouty arthritis 11/22/2014   Hyperlipidemia associated with type 2 diabetes mellitus (HCC) 11/22/2014    Essential (primary) hypertension 11/22/2014   Compulsive tobacco user syndrome 11/22/2014   Varicose veins of both lower extremities 11/22/2014   Hidradenitis suppurativa 09/06/2012   Benign paroxysmal positional nystagmus 07/29/2004    Allergies  Allergen Reactions   Egg-Derived Products Shortness Of Breath    Throat swells. Patient able to eat products that contain egg just not whole egg alone.   Fish Allergy Shortness Of Breath    Throat swells   Acyclovir And Related     Past Surgical History:  Procedure Laterality Date   CARDIAC ELECTROPHYSIOLOGY MAPPING AND ABLATION  07/2021   CARDIOVERSION N/A 06/17/2018   Procedure: CARDIOVERSION (CATH LAB);  Surgeon: Antonieta Iba, MD;  Location: [facility] ORS;  Service: Cardiovascular;  Laterality: N/A;   COLONOSCOPY WITH PROPOFOL N/A 06/04/2015   Procedure: COLONOSCOPY WITH PROPOFOL;  Surgeon: Midge Minium, MD;  5 tubular adenomas   COLONOSCOPY WITH PROPOFOL N/A 11/22/2021   Procedure: COLONOSCOPY WITH PROPOFOL;  Surgeon: Midge Minium, MD;  Location: [facility];  Service: Endoscopy;  Laterality: N/A;  Diabetic   HIP ARTHROPLASTY Left 2013   LEG SKIN LESION  BIOPSY / EXCISION Right 2015   Hidradenitis   POLYPECTOMY  06/04/2015   Procedure: POLYPECTOMY;  Surgeon: Midge Minium, MD;  Location: [facility];  Service: Endoscopy;;   POLYPECTOMY  11/22/2021   Procedure: POLYPECTOMY;  Surgeon: Midge Minium, MD;  Location: [facility];  Service: Endoscopy;;   VARICOSE VEIN SURGERY Bilateral 2015   VIDEO ASSISTED THORACOSCOPY (VATS)/ LOBECTOMY Right 07/28/2022    Social History   Tobacco Use   Smoking status: Every Day    Current packs/day: 0.25    Average packs/day: 0.3 packs/day for 30.0 years (7.5 ttl pk-yrs)    Types: Cigarettes   Smokeless tobacco: Never  Vaping Use   Vaping status: Never Used  Substance Use Topics   Alcohol use: No    Alcohol/week: 4.0 standard drinks of alcohol    Types: 4 Standard drinks  or equivalent per week   Drug use: No     Medication list has been reviewed and updated.  Current Meds  Medication Sig   Adalimumab 40 MG/0.8ML PSKT HUMIRA   amiodarone (PACERONE) 200 MG tablet Take 1 tablet by mouth daily.   clindamycin (CLEOCIN T) 1 % lotion Apply topically 2 (two) times daily.   doxycycline (VIBRA-TABS) 100 MG tablet Take 1 tablet (100 mg total) by mouth 2 (two) times daily for 10 days.   glimepiride (AMARYL) 1 MG tablet Take 1 mg by mouth daily with breakfast.   glucose blood (FREESTYLE LITE) test strip Use as instructed   hydroxychloroquine (PLAQUENIL) 200 MG tablet Take 200 mg by mouth daily.   Lancets (FREESTYLE) lancets Use as instructed   losartan-hydrochlorothiazide (HYZAAR) 100-25 MG tablet TAKE 1 TABLET BY MOUTH EVERY DAY   Multiple Vitamin (MULTIVITAMIN WITH MINERALS) TABS tablet Take 1 tablet by mouth daily. Complete Multivitamin   oxyCODONE (OXY IR/ROXICODONE) 5 MG immediate release tablet Take 1 tablet (5 mg total) by mouth every 4 (four) hours as needed for severe pain.   pravastatin (PRAVACHOL) 40 MG tablet TAKE 1  TABLET BY MOUTH EVERY DAY   rivaroxaban (XARELTO) 20 MG TABS tablet Take 1 tablet by mouth daily with breakfast.   Semaglutide, 2 MG/DOSE, 8 MG/3ML SOPN Inject 2 mg as directed once a week.   tamsulosin (FLOMAX) 0.4 MG CAPS capsule Take 1 tablet by mouth daily as needed.   [DISCONTINUED] allopurinol (ZYLOPRIM) 100 MG tablet Take 1 tablet (100 mg total) by mouth daily.   [DISCONTINUED] Semaglutide, 1 MG/DOSE, (OZEMPIC, 1 MG/DOSE,) 4 MG/3ML SOPN Inject 1 mg into the skin once a week.       11/17/2022    3:29 PM 08/15/2022    2:02 PM 05/14/2022    9:21 AM 09/04/2021    9:03 AM  GAD 7 : Generalized Anxiety Score  Nervous, Anxious, on Edge 0 0 0 0  Control/stop worrying 0 0 0 0  Worry too much - different things 0 0 0 0  Trouble relaxing 0 0 0 0  Restless 0 0 0 0  Easily annoyed or irritable 0 0 0 0  Afraid - awful might happen 0 0 0 0   Total GAD 7 Score 0 0 0 0  Anxiety Difficulty Not difficult at all Not difficult at all Not difficult at all Not difficult at all       11/17/2022    3:29 PM 10/23/2022    8:38 AM 08/15/2022    2:02 PM  Depression screen PHQ 2/9  Decreased Interest 0 0 0  Down, Depressed, Hopeless 0 0 0  PHQ - 2 Score 0 0 0  Altered sleeping 1 0 1  Tired, decreased energy 1 0 3  Change in appetite 0 0 0  Feeling bad or failure about yourself  0 0 0  Trouble concentrating 0 0 0  Moving slowly or fidgety/restless 0 0 0  Suicidal thoughts 0 0 0  PHQ-9 Score 2 0 4  Difficult doing work/chores Not difficult at all Not difficult at all Not difficult at all    BP Readings from Last 3 Encounters:  11/17/22 112/70  08/15/22 122/76  05/14/22 138/70    Physical Exam Vitals and nursing note reviewed.  Constitutional:      General: He is not in acute distress.    Appearance: He is well-developed.  HENT:     Head: Normocephalic and atraumatic.  Neck:     Vascular: No carotid bruit.  Cardiovascular:     Rate and Rhythm: Normal rate and regular rhythm.  Pulmonary:     Effort: Pulmonary effort is normal. No respiratory distress.     Breath sounds: No wheezing or rhonchi.  Abdominal:     General: There is distension.     Palpations: Abdomen is soft.  Musculoskeletal:     Cervical back: Normal range of motion.     Right lower leg: No edema.     Left lower leg: No edema.  Lymphadenopathy:     Cervical: No cervical adenopathy.  Skin:    General: Skin is warm and dry.     Findings: Lesion (raised weeping lesions in the lower abdomen without purulence) present. No rash.  Neurological:     Mental Status: He is alert and oriented to person, place, and time.  Psychiatric:        Mood and Affect: Mood normal.        Behavior: Behavior normal.     Wt Readings from Last 3 Encounters:  11/17/22 283 lb (128.4 kg)  10/23/22 292 lb (132.5 kg)  08/15/22 292  lb (132.5 kg)    BP 112/70   Pulse 94    Ht 6' (1.829 m)   Wt 283 lb (128.4 kg)   SpO2 96%   BMI 38.38 kg/m   Assessment and Plan:  Problem List Items Addressed This Visit       Unprioritized   Type II diabetes mellitus with complication (HCC) - Primary (Chronic)    Blood sugars stable without hypoglycemic symptoms or events. Current regimen is Ozempic 2 mg weekly but he could not tolerate it.  He resumed 1 mg dosing. Nephrology added Glimepiride 1 mg. Changes made last visit are increasing ozempic dose which he could not tolerate. 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) (Chronic)    Now on Plaquenil 200 mg per day      Relevant Medications   allopurinol (ZYLOPRIM) 100 MG tablet   NSCLC of right lung (HCC)    Recent CT chest for follow up Seeing surgical oncology tomorrow       Relevant Medications   allopurinol (ZYLOPRIM) 100 MG tablet   doxycycline (VIBRA-TABS) 100 MG tablet   Hidradenitis suppurativa (Chronic)    On Humira for HS; has flares off and on Currently has active lesions on lower abdomen      Relevant Medications   doxycycline (VIBRA-TABS) 100 MG tablet   Other Relevant Orders   CBC with Differential/Platelet   Essential (primary) hypertension (Chronic)    Normal exam with stable BP on losartan hct. No concerns or side effects to current medication. No change in regimen; continue low sodium diet.       Other Visit Diagnoses     Long-term current use of injectable noninsulin antidiabetic medication       Idiopathic gout, unspecified chronicity, unspecified site       Relevant Medications   allopurinol (ZYLOPRIM) 100 MG tablet       Return in about 4 months (around 03/19/2023) for DM, HTN.    Reubin Milan, MD [facility] Primary  Care and Sports Medicine [place]

## 2022-11-17 NOTE — Assessment & Plan Note (Signed)
 Normal exam with stable BP on losartan hct. No concerns or side effects to current medication. No change in regimen; continue low sodium diet.

## 2022-11-17 NOTE — Assessment & Plan Note (Signed)
On Humira for HS; has flares off and on Currently has active lesions on lower abdomen

## 2022-11-17 NOTE — Assessment & Plan Note (Signed)
Recent CT chest for follow up Seeing surgical oncology tomorrow

## 2022-12-04 ENCOUNTER — Encounter: Payer: Self-pay | Admitting: Internal Medicine

## 2022-12-13 ENCOUNTER — Other Ambulatory Visit: Payer: Self-pay | Admitting: Internal Medicine

## 2022-12-13 DIAGNOSIS — M1000 Idiopathic gout, unspecified site: Secondary | ICD-10-CM

## 2022-12-13 DIAGNOSIS — E118 Type 2 diabetes mellitus with unspecified complications: Secondary | ICD-10-CM

## 2022-12-13 DIAGNOSIS — E785 Hyperlipidemia, unspecified: Secondary | ICD-10-CM

## 2023-03-20 ENCOUNTER — Ambulatory Visit: Payer: Medicare HMO | Admitting: Internal Medicine

## 2023-04-13 ENCOUNTER — Ambulatory Visit: Payer: Medicare HMO | Admitting: Internal Medicine

## 2023-04-14 ENCOUNTER — Ambulatory Visit: Payer: Medicare HMO | Admitting: Internal Medicine

## 2023-04-14 NOTE — Progress Notes (Deleted)
 Date:  04/14/2023   Name:  Jon Mendez   DOB:  03/28/1961   MRN:  [id number]   Chief Complaint: No chief complaint on file.  HPI  Review of Systems   Lab Results  Component Value Date   NA 136 08/15/2022   K 4.7 08/15/2022   CO2 20 08/15/2022   GLUCOSE 117 (H) 08/15/2022   BUN 15 08/15/2022   CREATININE 1.78 (H) 08/15/2022   CALCIUM 9.7 08/15/2022   EGFR 43 (L) 08/15/2022   GFRNONAA 33 (L) 03/05/2020   Lab Results  Component Value Date   CHOL 222 (H) 05/14/2022   HDL 46 05/14/2022   LDLCALC 137 (H) 05/14/2022   TRIG 220 (H) 05/14/2022   CHOLHDL 4.8 05/14/2022   Lab Results  Component Value Date   TSH 2.430 05/06/2021   Lab Results  Component Value Date   HGBA1C 8.0 (H) 08/15/2022   Lab Results  Component Value Date   WBC 10.8 08/04/2022   HGB 9.2 (A) 08/04/2022   HCT 28 (A) 08/04/2022   MCV 70 (L) 09/04/2021   PLT 258 08/04/2022   Lab Results  Component Value Date   ALT 20 05/14/2022   AST 22 05/14/2022   ALKPHOS 121 05/14/2022   BILITOT 0.2 05/14/2022   No results found for: MARIEN BOLLS, VD25OH   Patient Active Problem List   Diagnosis Date Noted   NSCLC of right lung (HCC) 06/17/2022   Adenocarcinoma of right lung (HCC) 05/14/2022   History of colonic polyps    Polyp of transverse colon    Prostate cancer (HCC) 10/24/2021   Sleep-disordered breathing 09/04/2021   S/P ablation of atrial fibrillation 08/30/2021   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/06/2021   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   Hip pain, chronic, right 10/24/2020   Acquired thrombophilia (HCC) 06/20/2020   Type II diabetes mellitus with complication (HCC) 09/19/2015   Tubular adenoma of colon    Stage 3b chronic kidney disease (HCC) 04/25/2015   Chronic gouty arthritis 11/22/2014   Hyperlipidemia associated with type 2 diabetes mellitus (HCC) 11/22/2014    Essential (primary) hypertension 11/22/2014   Compulsive tobacco user syndrome 11/22/2014   Varicose veins of both lower extremities 11/22/2014   Hidradenitis suppurativa 09/06/2012   Benign paroxysmal positional nystagmus 07/29/2004    Allergies  Allergen Reactions   Egg-Derived Products Shortness Of Breath    Throat swells. Patient able to eat products that contain egg just not whole egg alone.   Fish Allergy Shortness Of Breath    Throat swells   Acyclovir And Related     Past Surgical History:  Procedure Laterality Date   CARDIAC ELECTROPHYSIOLOGY MAPPING AND ABLATION  07/2021   CARDIOVERSION N/A 06/17/2018   Procedure: CARDIOVERSION (CATH LAB);  Surgeon: Perla Evalene PARAS, MD;  Location: [facility] ORS;  Service: Cardiovascular;  Laterality: N/A;   COLONOSCOPY WITH PROPOFOL  N/A 06/04/2015   Procedure: COLONOSCOPY WITH PROPOFOL ;  Surgeon: Rogelia Copping, MD;  5 tubular adenomas   COLONOSCOPY WITH PROPOFOL  N/A 11/22/2021   Procedure: COLONOSCOPY WITH PROPOFOL ;  Surgeon: Copping Rogelia, MD;  Location: [facility];  Service: Endoscopy;  Laterality: N/A;  Diabetic   HIP ARTHROPLASTY Left 2013   LEG SKIN LESION  BIOPSY / EXCISION Right 2015   Hidradenitis   POLYPECTOMY  06/04/2015   Procedure: POLYPECTOMY;  Surgeon: Rogelia Copping, MD;  Location: [facility];  Service: Endoscopy;;  POLYPECTOMY  11/22/2021   Procedure: POLYPECTOMY;  Surgeon: Jinny Carmine, MD;  Location: [facility];  Service: Endoscopy;;   VARICOSE VEIN SURGERY Bilateral 2015   VIDEO ASSISTED THORACOSCOPY (VATS)/ LOBECTOMY Right 07/28/2022    Social History   Tobacco Use   Smoking status: Every Day    Current packs/day: 0.25    Average packs/day: 0.3 packs/day for 30.0 years (7.5 ttl pk-yrs)    Types: Cigarettes   Smokeless tobacco: Never  Vaping Use   Vaping status: Never Used  Substance Use Topics   Alcohol use: No    Alcohol/week: 4.0 standard drinks of alcohol    Types: 4 Standard  drinks or equivalent per week   Drug use: No     Medication list has been reviewed and updated.  No outpatient medications have been marked as taking for the 04/14/23 encounter (Appointment) with Justus Leita DEL, MD.       11/17/2022    3:29 PM 08/15/2022    2:02 PM 05/14/2022    9:21 AM 09/04/2021    9:03 AM  GAD 7 : Generalized Anxiety Score  Nervous, Anxious, on Edge 0 0 0 0  Control/stop worrying 0 0 0 0  Worry too much - different things 0 0 0 0  Trouble relaxing 0 0 0 0  Restless 0 0 0 0  Easily annoyed or irritable 0 0 0 0  Afraid - awful might happen 0 0 0 0  Total GAD 7 Score 0 0 0 0  Anxiety Difficulty Not difficult at all Not difficult at all Not difficult at all Not difficult at all       11/17/2022    3:29 PM 10/23/2022    8:38 AM 08/15/2022    2:02 PM  Depression screen PHQ 2/9  Decreased Interest 0 0 0  Down, Depressed, Hopeless 0 0 0  PHQ - 2 Score 0 0 0  Altered sleeping 1 0 1  Tired, decreased energy 1 0 3  Change in appetite 0 0 0  Feeling bad or failure about yourself  0 0 0  Trouble concentrating 0 0 0  Moving slowly or fidgety/restless 0 0 0  Suicidal thoughts 0 0 0  PHQ-9 Score 2 0 4  Difficult doing work/chores Not difficult at all Not difficult at all Not difficult at all    BP Readings from Last 3 Encounters:  11/17/22 112/70  08/15/22 122/76  05/14/22 138/70    Physical Exam  Wt Readings from Last 3 Encounters:  11/17/22 283 lb (128.4 kg)  10/23/22 292 lb (132.5 kg)  08/15/22 292 lb (132.5 kg)    There were no vitals taken for this visit.  Assessment and Plan:  Problem List Items Addressed This Visit   None   No follow-ups on file.    Leita HILARIO Justus, MD [facility] Primary Care and Sports Medicine [place]

## 2023-05-22 ENCOUNTER — Encounter: Payer: Self-pay | Admitting: Internal Medicine

## 2023-05-22 ENCOUNTER — Ambulatory Visit (INDEPENDENT_AMBULATORY_CARE_PROVIDER_SITE_OTHER): Payer: Medicare HMO | Admitting: Internal Medicine

## 2023-05-22 VITALS — BP 108/76 | HR 100 | Ht 72.0 in | Wt 287.0 lb

## 2023-05-22 DIAGNOSIS — L03119 Cellulitis of unspecified part of limb: Secondary | ICD-10-CM

## 2023-05-22 DIAGNOSIS — E1169 Type 2 diabetes mellitus with other specified complication: Secondary | ICD-10-CM

## 2023-05-22 DIAGNOSIS — C3491 Malignant neoplasm of unspecified part of right bronchus or lung: Secondary | ICD-10-CM

## 2023-05-22 DIAGNOSIS — E785 Hyperlipidemia, unspecified: Secondary | ICD-10-CM

## 2023-05-22 DIAGNOSIS — L02619 Cutaneous abscess of unspecified foot: Secondary | ICD-10-CM

## 2023-05-22 DIAGNOSIS — Z7984 Long term (current) use of oral hypoglycemic drugs: Secondary | ICD-10-CM

## 2023-05-22 DIAGNOSIS — N1832 Chronic kidney disease, stage 3b: Secondary | ICD-10-CM

## 2023-05-22 DIAGNOSIS — I10 Essential (primary) hypertension: Secondary | ICD-10-CM | POA: Diagnosis not present

## 2023-05-22 DIAGNOSIS — E118 Type 2 diabetes mellitus with unspecified complications: Secondary | ICD-10-CM | POA: Diagnosis not present

## 2023-05-22 MED ORDER — LOSARTAN POTASSIUM-HCTZ 100-25 MG PO TABS: 1.0000 | ORAL_TABLET | Freq: Every day | ORAL | 1 refills | Status: DC

## 2023-05-22 MED ORDER — HYDROCODONE-ACETAMINOPHEN 10-325 MG PO TABS: 1.0000 | ORAL_TABLET | Freq: Every evening | ORAL | 0 refills | Status: AC | PRN

## 2023-05-22 MED ORDER — DOXYCYCLINE HYCLATE 100 MG PO TABS: 100.0000 mg | ORAL_TABLET | Freq: Two times a day (BID) | ORAL | 0 refills | Status: AC

## 2023-05-22 MED ORDER — SEMAGLUTIDE (1 MG/DOSE) 4 MG/3ML ~~LOC~~ SOPN: 1.0000 mg | PEN_INJECTOR | SUBCUTANEOUS | 1 refills | Status: DC

## 2023-05-22 MED ORDER — PRAVASTATIN SODIUM 40 MG PO TABS: 40.0000 mg | ORAL_TABLET | Freq: Every day | ORAL | 1 refills | Status: AC

## 2023-05-22 NOTE — Progress Notes (Signed)
Date:  05/22/2023   Name:  Jon Mendez   DOB:  1960-12-22   MRN:  [id number]   Chief Complaint: Hypertension and Diabetes  Diabetes He presents for his follow-up diabetic visit. He has type 2 diabetes mellitus. His disease course has been stable. Pertinent negatives for hypoglycemia include no nervousness/anxiousness. Pertinent negatives for diabetes include no chest pain and no fatigue. Current diabetic treatments: ozempic 1 mg. There is no change in his home blood glucose trend.  Hypertension This is a chronic problem. The problem is controlled. Pertinent negatives include no chest pain, palpitations or shortness of breath. Past treatments include angiotensin blockers, diuretics and calcium channel blockers. The current treatment provides significant improvement.  Wound - the top of his right foot scraped the dresser and has progressed to a larger wound that will not heal.  He is using TAO only and a dry dressing.  Lung Cancer - s/p excision with no residual. On follow up scans he appeared to be doing well until the third one.  There was a new lesion that was recently biopsied - he does not have that result.  HS - he continues on Humira.    Medication list has been reviewed and updated.  Current Meds  Medication Sig   Adalimumab 40 MG/0.8ML PSKT HUMIRA   allopurinol (ZYLOPRIM) 100 MG tablet TAKE 1 TABLET BY MOUTH EVERY DAY   amiodarone (PACERONE) 200 MG tablet Take 1 tablet by mouth daily.   clindamycin (CLEOCIN T) 1 % lotion Apply topically 2 (two) times daily.   diltiazem (CARDIZEM CD) 240 MG 24 hr capsule Take 240 mg by mouth daily.   glucose blood (FREESTYLE LITE) test strip Use as instructed   hydroxychloroquine (PLAQUENIL) 200 MG tablet Take 200 mg by mouth daily.   Lancets (FREESTYLE) lancets Use as instructed   losartan-hydrochlorothiazide (HYZAAR) 100-25 MG tablet TAKE 1 TABLET BY MOUTH EVERY DAY   Multiple Vitamin (MULTIVITAMIN WITH MINERALS) TABS tablet Take 1  tablet by mouth daily. Complete Multivitamin   pravastatin (PRAVACHOL) 40 MG tablet TAKE 1 TABLET BY MOUTH EVERY DAY   rivaroxaban (XARELTO) 20 MG TABS tablet Take 1 tablet by mouth daily with breakfast.   tamsulosin (FLOMAX) 0.4 MG CAPS capsule Take 1 tablet by mouth daily as needed.   [DISCONTINUED] glimepiride (AMARYL) 1 MG tablet Take 1 mg by mouth daily with breakfast.   [DISCONTINUED] Semaglutide, 2 MG/DOSE, 8 MG/3ML SOPN Inject 2 mg as directed once a week.     Review of Systems  Constitutional:  Negative for chills, fatigue and fever.  HENT:  Negative for trouble swallowing.   Respiratory:  Negative for chest tightness and shortness of breath.   Cardiovascular:  Negative for chest pain, palpitations and leg swelling.  Gastrointestinal:  Negative for abdominal pain.  Musculoskeletal:  Positive for myalgias.  Skin:  Positive for wound.  Psychiatric/Behavioral:  Negative for dysphoric mood and sleep disturbance. The patient is not nervous/anxious.     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  Hip pain, chronic, right 10/24/2020   Acquired thrombophilia (HCC) 06/20/2020   Type II diabetes mellitus with complication (HCC) 09/19/2015   Tubular adenoma of colon    Stage 3b chronic kidney disease (HCC) 04/25/2015   Chronic gouty arthritis 11/22/2014   Hyperlipidemia associated with type 2 diabetes mellitus (HCC) 11/22/2014   Essential (primary) hypertension 11/22/2014   Compulsive tobacco user syndrome 11/22/2014    Varicose veins of both lower extremities 11/22/2014   Hidradenitis suppurativa 09/06/2012   Benign paroxysmal positional nystagmus 07/29/2004    Allergies  Allergen Reactions   Egg-Derived Products Shortness Of Breath    Throat swells. Patient able to eat products that contain egg just not whole egg alone.   Fish Allergy Shortness Of Breath    Throat swells   Acyclovir And Related     Immunization History  Administered Date(s) Administered   PFIZER(Purple Top)SARS-COV-2 Vaccination 06/23/2019, 07/14/2019, 03/06/2020   PNEUMOCOCCAL CONJUGATE-20 05/14/2022   Pneumococcal Polysaccharide-23 04/28/2016   Tdap 04/09/2011    Past Surgical History:  Procedure Laterality Date   CARDIAC ELECTROPHYSIOLOGY MAPPING AND ABLATION  07/2021   CARDIOVERSION N/A 06/17/2018   Procedure: CARDIOVERSION (CATH LAB);  Surgeon: Antonieta Iba, MD;  Location: [facility];  Service: Cardiovascular;  Laterality: N/A;   COLONOSCOPY WITH PROPOFOL N/A 06/04/2015   Procedure: COLONOSCOPY WITH PROPOFOL;  Surgeon: Midge Minium, MD;  5 tubular adenomas   COLONOSCOPY WITH PROPOFOL N/A 11/22/2021   Procedure: COLONOSCOPY WITH PROPOFOL;  Surgeon: Midge Minium, MD;  Location: [facility];  Service: Endoscopy;  Laterality: N/A;  Diabetic   HIP ARTHROPLASTY Left 2013   LEG SKIN LESION  BIOPSY / EXCISION Right 2015   Hidradenitis   POLYPECTOMY  06/04/2015   Procedure: POLYPECTOMY;  Surgeon: Midge Minium, MD;  Location: [facility];  Service: Endoscopy;;   POLYPECTOMY  11/22/2021   Procedure: POLYPECTOMY;  Surgeon: Midge Minium, MD;  Location: [facility];  Service: Endoscopy;;   VARICOSE VEIN SURGERY Bilateral 2015   VIDEO ASSISTED THORACOSCOPY (VATS)/ LOBECTOMY Right 07/28/2022    Social History   Tobacco Use   Smoking status: Every Day    Current packs/day: 0.25    Average packs/day: 0.3 packs/day for 30.0 years (7.5 ttl pk-yrs)    Types: Cigarettes   Smokeless tobacco: Never   Vaping Use   Vaping status: Never Used  Substance Use Topics   Alcohol use: No    Alcohol/week: 4.0 standard drinks of alcohol    Types: 4 Standard drinks or equivalent per week   Drug use: No    Family History  Problem Relation Age of Onset   Hypertension Mother    Lymphoma Father    Diabetes Sister    Diabetes Brother    Diabetes Brother         05/22/2023    4:00 PM 11/17/2022    3:29 PM 08/15/2022    2:02 PM 05/14/2022    9:21 AM  GAD 7 : Generalized Anxiety Score  Nervous, Anxious, on Edge 0 0 0 0  Control/stop worrying 0 0 0 0  Worry too much - different things 0 0 0 0  Trouble relaxing 0 0 0 0  Restless 0 0 0 0  Easily annoyed or irritable 0 0 0 0  Afraid - awful might happen 0 0 0 0  Total GAD 7 Score 0 0 0 0  Anxiety Difficulty Not difficult at all Not difficult at all Not difficult at all Not difficult at all  05/22/2023    4:00 PM 11/17/2022    3:29 PM 10/23/2022    8:38 AM  Depression screen PHQ 2/9  Decreased Interest 0 0 0  Down, Depressed, Hopeless 0 0 0  PHQ - 2 Score 0 0 0  Altered sleeping  1 0  Tired, decreased energy  1 0  Change in appetite  0 0  Feeling bad or failure about yourself   0 0  Trouble concentrating  0 0  Moving slowly or fidgety/restless  0 0  Suicidal thoughts  0 0  PHQ-9 Score  2 0  Difficult doing work/chores  Not difficult at all Not difficult at all    BP Readings from Last 3 Encounters:  05/22/23 108/76  11/17/22 112/70  08/15/22 122/76    Wt Readings from Last 3 Encounters:  05/22/23 287 lb (130.2 kg)  11/17/22 283 lb (128.4 kg)  10/23/22 292 lb (132.5 kg)    BP 108/76   Pulse 100   Ht 6' (1.829 m)   Wt 287 lb (130.2 kg)   SpO2 97%   BMI 38.92 kg/m    Physical Exam Constitutional:      Appearance: He is obese.  Cardiovascular:     Rate and Rhythm: Normal rate and regular rhythm.     Heart sounds: No murmur heard. Pulmonary:     Effort: Pulmonary effort is normal. No respiratory distress.      Breath sounds: No wheezing or rhonchi.  Musculoskeletal:     Cervical back: Normal range of motion.  Lymphadenopathy:     Cervical: No cervical adenopathy.  Skin:    General: Skin is warm and dry.     Findings: Wound present.     Comments: Top of right foot - see image  Neurological:     Mental Status: He is alert.     Recent Labs     Component Value Date/Time   NA 136 08/15/2022 1437   NA 135 (L) 05/09/2012 1336   K 4.7 08/15/2022 1437   K 4.1 05/09/2012 1336   CL 99 08/15/2022 1437   CL 103 05/09/2012 1336   CO2 20 08/15/2022 1437   CO2 24 05/09/2012 1336   GLUCOSE 117 (H) 08/15/2022 1437   GLUCOSE 90 05/06/2018 1230   GLUCOSE 110 (H) 05/09/2012 1336   BUN 15 08/15/2022 1437   BUN 15 05/09/2012 1336   CREATININE 1.78 (H) 08/15/2022 1437   CREATININE 1.50 (H) 05/09/2012 1336   CALCIUM 9.7 08/15/2022 1437   CALCIUM 9.4 05/09/2012 1336   PROT 8.4 05/14/2022 1015   PROT 9.7 (H) 05/09/2012 1336   ALBUMIN 4.1 05/14/2022 1015   ALBUMIN 3.7 05/09/2012 1336   AST 22 05/14/2022 1015   AST 21 05/09/2012 1336   ALT 20 05/14/2022 1015   ALT 30 05/09/2012 1336   ALKPHOS 121 05/14/2022 1015   ALKPHOS 168 (H) 05/09/2012 1336   BILITOT 0.2 05/14/2022 1015   BILITOT 0.6 05/09/2012 1336   GFRNONAA 33 (L) 03/05/2020 1402   GFRNONAA 53 (L) 05/09/2012 1336   GFRAA 39 (L) 03/05/2020 1402   GFRAA >60 05/09/2012 1336    Lab Results  Component Value Date   WBC 10.8 08/04/2022   HGB 9.2 (A) 08/04/2022   HCT 28 (A) 08/04/2022   MCV 70 (L) 09/04/2021   PLT 258 08/04/2022   Lab Results  Component Value Date   HGBA1C 8.0 (H) 08/15/2022   Lab Results  Component Value Date   CHOL 222 (H)  05/14/2022   HDL 46 05/14/2022   LDLCALC 137 (H) 05/14/2022   TRIG 220 (H) 05/14/2022   CHOLHDL 4.8 05/14/2022   Lab Results  Component Value Date   TSH 2.430 05/06/2021     Assessment and Plan: Problem List Items Addressed This Visit       Unprioritized   Hyperlipidemia associated  with type 2 diabetes mellitus (HCC) (Chronic)   Relevant Medications   Semaglutide, 1 MG/DOSE, 4 MG/3ML SOPN   losartan-hydrochlorothiazide (HYZAAR) 100-25 MG tablet   pravastatin (PRAVACHOL) 40 MG tablet   Essential (primary) hypertension - Primary (Chronic)   Controlled BP with normal exam. Current regimen is losartan, hydrochlorothiazide and cardiazem. Will continue same medications; encourage continued reduced sodium diet.       Relevant Medications   losartan-hydrochlorothiazide (HYZAAR) 100-25 MG tablet   pravastatin (PRAVACHOL) 40 MG tablet   Other Relevant Orders   CBC with Differential/Platelet   Stage 3b chronic kidney disease (HCC) (Chronic)   Type II diabetes mellitus with complication (HCC) (Chronic)   Blood sugars stable without hypoglycemic symptoms or events. Currently managed with Ozempic 1 mg,  needs refill.  Could not tolerate 2 mg or Amaryl. Changes made last visit are none. Lab Results  Component Value Date   HGBA1C 8.0 (H) 08/15/2022         Relevant Medications   Semaglutide, 1 MG/DOSE, 4 MG/3ML SOPN   losartan-hydrochlorothiazide (HYZAAR) 100-25 MG tablet   pravastatin (PRAVACHOL) 40 MG tablet   Other Relevant Orders   Comprehensive metabolic panel   Hemoglobin A1c   Microalbumin / creatinine urine ratio   Adenocarcinoma of right lung (HCC) (Chronic)   S/p surgery and recent biopsy of new lesion Seeing Oncology later this month for follow up      Relevant Medications   doxycycline (VIBRA-TABS) 100 MG tablet   Other Visit Diagnoses       Long term current use of oral hypoglycemic drug         Dyslipidemia       Relevant Medications   pravastatin (PRAVACHOL) 40 MG tablet     Cellulitis and abscess of foot       Relevant Medications   doxycycline (VIBRA-TABS) 100 MG tablet   HYDROcodone-acetaminophen (NORCO) 10-325 MG tablet      Bari Edward, MD

## 2023-05-22 NOTE — Assessment & Plan Note (Addendum)
Blood sugars stable without hypoglycemic symptoms or events. Currently managed with Ozempic 1 mg,  needs refill.  Could not tolerate 2 mg or Amaryl. Changes made last visit are none. Lab Results  Component Value Date   HGBA1C 8.0 (H) 08/15/2022

## 2023-05-22 NOTE — Assessment & Plan Note (Signed)
Controlled BP with normal exam. Current regimen is losartan, hydrochlorothiazide and cardiazem. Will continue same medications; encourage continued reduced sodium diet.

## 2023-05-22 NOTE — Assessment & Plan Note (Signed)
S/p surgery and recent biopsy of new lesion Seeing Oncology later this month for follow up

## 2023-05-24 ENCOUNTER — Other Ambulatory Visit: Payer: Self-pay | Admitting: Internal Medicine

## 2023-05-24 ENCOUNTER — Encounter: Payer: Self-pay | Admitting: Internal Medicine

## 2023-05-24 DIAGNOSIS — E118 Type 2 diabetes mellitus with unspecified complications: Secondary | ICD-10-CM

## 2023-05-24 LAB — COMPREHENSIVE METABOLIC PANEL
ALT: 11 [IU]/L (ref 0–44)
AST: 18 [IU]/L (ref 0–40)
Albumin: 3.6 g/dL — ABNORMAL LOW (ref 3.9–4.9)
Alkaline Phosphatase: 140 [IU]/L — ABNORMAL HIGH (ref 44–121)
BUN/Creatinine Ratio: 10 (ref 10–24)
BUN: 18 mg/dL (ref 8–27)
Bilirubin Total: 0.2 mg/dL (ref 0.0–1.2)
CO2: 22 mmol/L (ref 20–29)
Calcium: 9.5 mg/dL (ref 8.6–10.2)
Chloride: 97 mmol/L (ref 96–106)
Creatinine, Ser: 1.79 mg/dL — ABNORMAL HIGH (ref 0.76–1.27)
Globulin, Total: 5 g/dL — ABNORMAL HIGH (ref 1.5–4.5)
Glucose: 141 mg/dL — ABNORMAL HIGH (ref 70–99)
Potassium: 4.4 mmol/L (ref 3.5–5.2)
Sodium: 134 mmol/L (ref 134–144)
Total Protein: 8.6 g/dL — ABNORMAL HIGH (ref 6.0–8.5)
eGFR: 42 mL/min/{1.73_m2} — ABNORMAL LOW (ref >59)

## 2023-05-24 LAB — CBC WITH DIFFERENTIAL/PLATELET
Basophils Absolute: 0.1 10*3/uL (ref 0.0–0.2)
Basos: 1 %
EOS (ABSOLUTE): 0.3 10*3/uL (ref 0.0–0.4)
Eos: 2 %
Hematocrit: 33.3 % — ABNORMAL LOW (ref 37.5–51.0)
Hemoglobin: 10.7 g/dL — ABNORMAL LOW (ref 13.0–17.7)
Immature Grans (Abs): 0.1 10*3/uL (ref 0.0–0.1)
Immature Granulocytes: 1 %
Lymphocytes Absolute: 2.6 10*3/uL (ref 0.7–3.1)
Lymphs: 19 %
MCH: 23.1 pg — ABNORMAL LOW (ref 26.6–33.0)
MCHC: 32.1 g/dL (ref 31.5–35.7)
MCV: 72 fL — ABNORMAL LOW (ref 79–97)
Monocytes Absolute: 1 10*3/uL — ABNORMAL HIGH (ref 0.1–0.9)
Monocytes: 7 %
Neutrophils Absolute: 10.1 10*3/uL — ABNORMAL HIGH (ref 1.4–7.0)
Neutrophils: 70 %
Platelets: 423 10*3/uL (ref 150–450)
RBC: 4.64 x10E6/uL (ref 4.14–5.80)
RDW: 16 % — ABNORMAL HIGH (ref 11.6–15.4)
WBC: 14.2 10*3/uL — ABNORMAL HIGH (ref 3.4–10.8)

## 2023-05-24 LAB — MICROALBUMIN / CREATININE URINE RATIO
Creatinine, Urine: 147.3 mg/dL
Microalb/Creat Ratio: 145 mg/g{creat} — ABNORMAL HIGH (ref 0–29)
Microalbumin, Urine: 214.1 ug/mL

## 2023-05-24 LAB — HEMOGLOBIN A1C
Est. average glucose Bld gHb Est-mCnc: 206 mg/dL
Hgb A1c MFr Bld: 8.8 % — ABNORMAL HIGH (ref 4.8–5.6)

## 2023-05-24 MED ORDER — METFORMIN HCL ER 500 MG PO TB24: 500.0000 mg | ORAL_TABLET | Freq: Every day | ORAL | 1 refills | Status: DC

## 2023-06-16 ENCOUNTER — Ambulatory Visit (INDEPENDENT_AMBULATORY_CARE_PROVIDER_SITE_OTHER): Payer: Medicare HMO | Admitting: Internal Medicine

## 2023-06-16 ENCOUNTER — Encounter: Payer: Self-pay | Admitting: Internal Medicine

## 2023-06-16 VITALS — BP 118/68 | HR 91 | Ht 72.0 in | Wt 284.5 lb

## 2023-06-16 DIAGNOSIS — L02619 Cutaneous abscess of unspecified foot: Secondary | ICD-10-CM

## 2023-06-16 DIAGNOSIS — M1000 Idiopathic gout, unspecified site: Secondary | ICD-10-CM | POA: Diagnosis not present

## 2023-06-16 DIAGNOSIS — I480 Paroxysmal atrial fibrillation: Secondary | ICD-10-CM

## 2023-06-16 DIAGNOSIS — L03119 Cellulitis of unspecified part of limb: Secondary | ICD-10-CM

## 2023-06-16 DIAGNOSIS — E118 Type 2 diabetes mellitus with unspecified complications: Secondary | ICD-10-CM | POA: Diagnosis not present

## 2023-06-16 DIAGNOSIS — M05772 Rheumatoid arthritis with rheumatoid factor of left ankle and foot without organ or systems involvement: Secondary | ICD-10-CM

## 2023-06-16 DIAGNOSIS — Z7985 Long-term (current) use of injectable non-insulin antidiabetic drugs: Secondary | ICD-10-CM

## 2023-06-16 MED ORDER — AMIODARONE HCL 200 MG PO TABS: 200.0000 mg | ORAL_TABLET | Freq: Every day | ORAL | 1 refills | Status: AC

## 2023-06-16 MED ORDER — INSULIN GLARGINE-YFGN 100 UNIT/ML ~~LOC~~ SOPN
10.0000 [IU] | PEN_INJECTOR | Freq: Every day | SUBCUTANEOUS | 1 refills | Status: DC
Start: 2023-06-16 — End: 2023-07-10

## 2023-06-16 MED ORDER — PEN NEEDLES 31G X 6 MM MISC: 1.0000 | Freq: Every day | 0 refills | Status: AC

## 2023-06-16 MED ORDER — AMOXICILLIN-POT CLAVULANATE 875-125 MG PO TABS: 1.0000 | ORAL_TABLET | Freq: Two times a day (BID) | ORAL | 0 refills | Status: AC

## 2023-06-16 MED ORDER — ALLOPURINOL 100 MG PO TABS: 100.0000 mg | ORAL_TABLET | Freq: Every day | ORAL | 1 refills | Status: AC

## 2023-06-16 NOTE — Patient Instructions (Signed)
 Take 10 units of insulin every morning for 4 weeks.  After 4 weeks, if blood sugars are still higher than 120 in general, increase the dose of insulin to 15 units and continue this daily.  Do not take any more metformin.

## 2023-06-16 NOTE — Progress Notes (Signed)
 Date:  06/16/2023   Name:  Jon Mendez   DOB:  April 19, 1960   MRN:  [id number]   Chief Complaint: Wound Check  Diabetes He presents for his follow-up diabetic visit. He has type 2 diabetes mellitus. Pertinent negatives for hypoglycemia include no nervousness/anxiousness. Associated symptoms include foot ulcerations. Pertinent negatives for diabetes include no blurred vision, no chest pain and no fatigue. Symptoms are stable. Current diabetic treatments: ozempic; MTF stopped due to renal issues.  Foot wound - completed Doxycycline without much improvement.   Recurrent lung cancer - restaging planned with tumor directed therapy after that. Gout - needs refill on Allopurinol. Paroxsymal Afib - maintained on metoprolol and amiodarone.  Seen by Cardiology in December.  Review of Systems  Constitutional:  Negative for chills, fatigue and fever.  HENT:  Negative for trouble swallowing.   Eyes:  Negative for blurred vision.  Respiratory:  Negative for chest tightness, shortness of breath and wheezing.   Cardiovascular:  Negative for chest pain.  Skin:  Positive for rash (from HS) and wound.  Psychiatric/Behavioral:  Negative for dysphoric mood and sleep disturbance. The patient is not nervous/anxious.      Lab Results  Component Value Date   NA 134 05/22/2023   K 4.4 05/22/2023   CO2 22 05/22/2023   GLUCOSE 141 (H) 05/22/2023   BUN 18 05/22/2023   CREATININE 1.79 (H) 05/22/2023   CALCIUM 9.5 05/22/2023   EGFR 42 (L) 05/22/2023   GFRNONAA 33 (L) 03/05/2020   Lab Results  Component Value Date   CHOL 222 (H) 05/14/2022   HDL 46 05/14/2022   LDLCALC 137 (H) 05/14/2022   TRIG 220 (H) 05/14/2022   CHOLHDL 4.8 05/14/2022   Lab Results  Component Value Date   TSH 2.430 05/06/2021   Lab Results  Component Value Date   HGBA1C 8.8 (H) 05/22/2023   Lab Results  Component Value Date   WBC 14.2 (H) 05/22/2023   HGB 10.7 (L) 05/22/2023   HCT 33.3 (L) 05/22/2023   MCV 72 (L)  05/22/2023   PLT 423 05/22/2023   Lab Results  Component Value Date   ALT 11 05/22/2023   AST 18 05/22/2023   ALKPHOS 140 (H) 05/22/2023   BILITOT 0.2 05/22/2023   No results found for: "25OHVITD2", "25OHVITD3", "VD25OH"   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   Hip pain, chronic, right 10/24/2020   Acquired thrombophilia (HCC) 06/20/2020   Type II diabetes mellitus with complication (HCC) 09/19/2015   Tubular adenoma of colon    Stage 3b chronic kidney disease (HCC) 04/25/2015   Chronic gouty arthritis 11/22/2014   Hyperlipidemia associated with type 2 diabetes mellitus (HCC) 11/22/2014   Essential (primary) hypertension 11/22/2014   Compulsive tobacco user syndrome 11/22/2014   Varicose veins of both lower extremities 11/22/2014   Hidradenitis suppurativa 09/06/2012   Benign paroxysmal positional nystagmus 07/29/2004    Allergies  Allergen Reactions   Egg-Derived Products Shortness Of Breath    Throat swells. Patient able to eat  products that contain egg just not whole egg alone.   Fish Allergy Shortness Of Breath    Throat swells   Acyclovir And Related     Past Surgical History:  Procedure Laterality Date   CARDIAC ELECTROPHYSIOLOGY MAPPING AND ABLATION  07/2021   CARDIOVERSION N/A 06/17/2018   Procedure: CARDIOVERSION (CATH LAB);  Surgeon: Antonieta Iba, MD;  Location: [facility] OR[facility];  Service: Cardiovascular;  Laterality: N/A;   COLONOSCOPY WITH PROPOFOL N/A 06/04/2015   Procedure: COLONOSCOPY  WITH PROPOFOL;  Surgeon: Midge Minium, MD;  5 tubular adenomas   COLONOSCOPY WITH PROPOFOL N/A 11/22/2021   Procedure: COLONOSCOPY WITH PROPOFOL;  Surgeon: Midge Minium, MD;  Location: [facility];  Service: Endoscopy;  Laterality: N/A;  Diabetic   HIP ARTHROPLASTY Left 2013   LEG SKIN LESION  BIOPSY / EXCISION Right 2015   Hidradenitis   POLYPECTOMY  06/04/2015   Procedure: POLYPECTOMY;  Surgeon: Midge Minium, MD;  Location: [facility];  Service: Endoscopy;;   POLYPECTOMY  11/22/2021   Procedure: POLYPECTOMY;  Surgeon: Midge Minium, MD;  Location: [facility];  Service: Endoscopy;;   VARICOSE VEIN SURGERY Bilateral 2015   VIDEO ASSISTED THORACOSCOPY (VATS)/ LOBECTOMY Right 07/28/2022    Social History   Tobacco Use   Smoking status: Every Day    Current packs/day: 0.25    Average packs/day: 0.3 packs/day for 30.0 years (7.5 ttl pk-yrs)    Types: Cigarettes   Smokeless tobacco: Never  Vaping Use   Vaping status: Never Used  Substance Use Topics   Alcohol use: No    Alcohol/week: 4.0 standard drinks of alcohol    Types: 4 Standard drinks or equivalent per week   Drug use: No     Medication list has been reviewed and updated.  Current Meds  Medication Sig   Adalimumab 40 MG/0.8ML PSKT HUMIRA   amoxicillin-clavulanate (AUGMENTIN) 875-125 MG tablet Take 1 tablet by mouth 2 (two) times daily for 10 days.   clindamycin (CLEOCIN T) 1 % lotion Apply topically 2 (two) times daily.   diltiazem (CARDIZEM CD) 240 MG 24 hr capsule Take 240 mg by mouth daily.   glucose blood (FREESTYLE LITE) test strip Use as instructed   hydroxychloroquine (PLAQUENIL) 200 MG tablet Take 200 mg by mouth daily.   insulin glargine-yfgn (SEMGLEE) 100 UNIT/ML Pen Inject 10-50 Units into the skin daily.   Insulin Pen Needle (PEN NEEDLES) 31G X 6 MM MISC 1 each by Does not apply route daily at 6 (six) AM.   Lancets (FREESTYLE) lancets Use as instructed   losartan-hydrochlorothiazide  (HYZAAR) 100-25 MG tablet Take 1 tablet by mouth daily.   Multiple Vitamin (MULTIVITAMIN WITH MINERALS) TABS tablet Take 1 tablet by mouth daily. Complete Multivitamin   pravastatin (PRAVACHOL) 40 MG tablet Take 1 tablet (40 mg total) by mouth daily.   rivaroxaban (XARELTO) 20 MG TABS tablet Take 1 tablet by mouth daily with breakfast.   Semaglutide, 1 MG/DOSE, 4 MG/3ML SOPN Inject 1 mg as directed once a week.   tamsulosin (FLOMAX) 0.4 MG CAPS capsule Take 1 tablet by mouth daily as needed.   [DISCONTINUED] allopurinol (ZYLOPRIM) 100 MG tablet TAKE 1 TABLET BY MOUTH EVERY DAY   [DISCONTINUED] amiodarone (PACERONE) 200 MG tablet Take 1 tablet by mouth daily.   [DISCONTINUED] metFORMIN (GLUCOPHAGE-XR) 500 MG 24 hr tablet Take 1 tablet (500 mg total) by mouth daily with breakfast.       06/16/2023    3:53 PM 05/22/2023    4:00  PM 11/17/2022    3:29 PM 08/15/2022    2:02 PM  GAD 7 : Generalized Anxiety Score  Nervous, Anxious, on Edge 0 0 0 0  Control/stop worrying 0 0 0 0  Worry too much - different things 0 0 0 0  Trouble relaxing 0 0 0 0  Restless 0 0 0 0  Easily annoyed or irritable 0 0 0 0  Afraid - awful might happen 0 0 0 0  Total GAD 7 Score 0 0 0 0  Anxiety Difficulty Not difficult at all Not difficult at all Not difficult at all Not difficult at all       06/16/2023    3:53 PM 05/22/2023    4:00 PM 11/17/2022    3:29 PM  Depression screen PHQ 2/9  Decreased Interest 0 0 0  Down, Depressed, Hopeless 0 0 0  PHQ - 2 Score 0 0 0  Altered sleeping 1  1  Tired, decreased energy 1  1  Change in appetite 0  0  Feeling bad or failure about yourself  0  0  Trouble concentrating 0  0  Moving slowly or fidgety/restless 0  0  Suicidal thoughts 0  0  PHQ-9 Score 2  2  Difficult doing work/chores Not difficult at all  Not difficult at all    BP Readings from Last 3 Encounters:  06/16/23 118/68  05/22/23 108/76  11/17/22 112/70    Physical Exam Constitutional:       Appearance: He is obese.  Cardiovascular:     Rate and Rhythm: Normal rate and regular rhythm.     Heart sounds: No murmur heard. Pulmonary:     Effort: Pulmonary effort is normal.     Breath sounds: No wheezing or rhonchi.  Musculoskeletal:     Cervical back: Normal range of motion.  Skin:    Comments: Ulcer on dorsum of right foot - unchanged in size but less heaped and dryer -still with odor  Neurological:     Mental Status: He is alert.     Wt Readings from Last 3 Encounters:  06/16/23 284 lb 8 oz (129 kg)  05/22/23 287 lb (130.2 kg)  11/17/22 283 lb (128.4 kg)    BP 118/68   Pulse 91   Ht 6' (1.829 m)   Wt 284 lb 8 oz (129 kg)   SpO2 97%   BMI 38.59 kg/m   Assessment and Plan:  Problem List Items Addressed This Visit       Unprioritized   Rheumatoid arthritis, unspecified (HCC)   Relevant Medications   allopurinol (ZYLOPRIM) 100 MG tablet   Type II diabetes mellitus with complication (HCC) (Chronic)   BS are not controlled and will impair wound healing, response to cancer treatment etc. Stop MTF - he takes this intermittently. Monitor BS more carefully. Continue Ozempic 1 mg weekly Add Semglee 10 units every AM - increase to 15 units after 4 weeks Follow up in 3 mo      Relevant Medications   insulin glargine-yfgn (SEMGLEE) 100 UNIT/ML Pen   Insulin Pen Needle (PEN NEEDLES) 31G X 6 MM MISC   Other Visit Diagnoses       Cellulitis and abscess of foot    -  Primary   slightly improved after Doxy will switch to augmentin - if not improve, refer to wound clinic   Relevant Medications   amoxicillin-clavulanate (AUGMENTIN) 875-125 MG tablet     Idiopathic gout, unspecified chronicity, unspecified site  Relevant Medications   allopurinol (ZYLOPRIM) 100 MG tablet     Paroxysmal atrial fibrillation (HCC)       Relevant Medications   amiodarone (PACERONE) 200 MG tablet     Long-term current use of injectable noninsulin antidiabetic medication            Return in about 3 months (around 09/16/2023) for DM, HTN.    Reubin Milan, MD [facility] [place]

## 2023-06-16 NOTE — Assessment & Plan Note (Signed)
 BS are not controlled and will impair wound healing, response to cancer treatment etc. Stop MTF - he takes this intermittently. Monitor BS more carefully. Continue Ozempic 1 mg weekly Add Semglee 10 units every AM - increase to 15 units after 4 weeks Follow up in 3 mo

## 2023-07-09 ENCOUNTER — Other Ambulatory Visit: Payer: Self-pay | Admitting: Internal Medicine

## 2023-07-09 DIAGNOSIS — E118 Type 2 diabetes mellitus with unspecified complications: Secondary | ICD-10-CM

## 2023-07-10 ENCOUNTER — Other Ambulatory Visit: Payer: Self-pay

## 2023-07-10 NOTE — Telephone Encounter (Signed)
 Requested medication (s) are due for refill today: see below  Requested medication (s) are on the active medication list: yes  Last refill:  06/16/23  Future visit scheduled: yes  Notes to clinic:    Pharmacy comment: changed to LANTUS SOLOSTAR PEN,  REQUEST FOR 90 DAYS PRESCRIPTION. DX Code Needed.       Requested Prescriptions  Pending Prescriptions Disp Refills   LANTUS SOLOSTAR 100 UNIT/ML Solostar Pen [Pharmacy Med Name: LANTUS SOLOSTAR 100 UNIT/ML]  1    Sig: INJECT 10-50 UNITS INTO THE SKIN DAILY.     Endocrinology:  Diabetes - Insulins Failed - 07/10/2023 11:52 AM      Failed - HBA1C is between 0 and 7.9 and within 180 days    Hemoglobin A1C  Date Value Ref Range Status  10/01/2020 8.2  Final   Hgb A1c MFr Bld  Date Value Ref Range Status  05/22/2023 8.8 (H) 4.8 - 5.6 % Final    Comment:             Prediabetes: 5.7 - 6.4          Diabetes: >6.4          Glycemic control for adults with diabetes: <7.0          Passed - Valid encounter within last 6 months    Recent Outpatient Visits           3 weeks ago Cellulitis and abscess of foot   [place] [facility] at [facility], Nyoka Cowden, MD   1 month ago Essential (primary) hypertension   [place] [facility] at [facility], Nyoka Cowden, MD       Future Appointments             In 2 months Judithann Graves, Nyoka Cowden, MD [facility] at [facility], [facility]

## 2023-07-10 NOTE — Telephone Encounter (Signed)
 Please review. Doesn't look like you have prescribed this medication to this pt.  KP

## 2023-07-17 ENCOUNTER — Other Ambulatory Visit: Payer: Self-pay

## 2023-07-17 ENCOUNTER — Other Ambulatory Visit: Payer: Self-pay | Admitting: Internal Medicine

## 2023-07-17 ENCOUNTER — Other Ambulatory Visit: Payer: Self-pay | Admitting: Pharmacist

## 2023-07-17 DIAGNOSIS — E118 Type 2 diabetes mellitus with unspecified complications: Secondary | ICD-10-CM

## 2023-07-17 MED ORDER — OZEMPIC (0.25 OR 0.5 MG/DOSE) 2 MG/3ML ~~LOC~~ SOPN
PEN_INJECTOR | SUBCUTANEOUS | 0 refills | Status: DC
Start: 2023-07-17 — End: 2023-08-06
  Filled 2023-07-17: qty 3, 28d supply, fill #0

## 2023-07-17 NOTE — Progress Notes (Unsigned)
 Date:  07/17/2023   Name:  Jon Mendez   DOB:  01/03/1961   MRN:  [id number]   Chief Complaint: No chief complaint on file.  HPI  Review of Systems   Lab Results  Component Value Date   NA 134 05/22/2023   K 4.4 05/22/2023   CO2 22 05/22/2023   GLUCOSE 141 (H) 05/22/2023   BUN 18 05/22/2023   CREATININE 1.79 (H) 05/22/2023   CALCIUM 9.5 05/22/2023   EGFR 42 (L) 05/22/2023   GFRNONAA 33 (L) 03/05/2020   Lab Results  Component Value Date   CHOL 222 (H) 05/14/2022   HDL 46 05/14/2022   LDLCALC 137 (H) 05/14/2022   TRIG 220 (H) 05/14/2022   CHOLHDL 4.8 05/14/2022   Lab Results  Component Value Date   TSH 2.430 05/06/2021   Lab Results  Component Value Date   HGBA1C 8.8 (H) 05/22/2023   Lab Results  Component Value Date   WBC 14.2 (H) 05/22/2023   HGB 10.7 (L) 05/22/2023   HCT 33.3 (L) 05/22/2023   MCV 72 (L) 05/22/2023   PLT 423 05/22/2023   Lab Results  Component Value Date   ALT 11 05/22/2023   AST 18 05/22/2023   ALKPHOS 140 (H) 05/22/2023   BILITOT 0.2 05/22/2023   No results found for: "25OHVITD2", "25OHVITD3", "VD25OH"   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   Hip pain, chronic, right 10/24/2020   Acquired thrombophilia (HCC) 06/20/2020   Type II diabetes mellitus with complication (HCC) 09/19/2015   Tubular adenoma of colon    Stage 3b chronic kidney disease (HCC) 04/25/2015   Chronic gouty arthritis 11/22/2014   Hyperlipidemia associated with type 2 diabetes  mellitus (HCC) 11/22/2014   Essential (primary) hypertension 11/22/2014   Compulsive tobacco user syndrome 11/22/2014   Varicose veins of both lower extremities 11/22/2014   Hidradenitis suppurativa 09/06/2012   Benign paroxysmal positional nystagmus 07/29/2004    Allergies  Allergen Reactions   Egg-Derived Products Shortness Of Breath    Throat swells. Patient able to eat products that contain egg just not whole egg alone.   Fish Allergy Shortness Of Breath    Throat swells   Acyclovir And Related     Past Surgical History:  Procedure Laterality Date   CARDIAC ELECTROPHYSIOLOGY MAPPING AND ABLATION  07/2021   CARDIOVERSION N/A 06/17/2018   Procedure: CARDIOVERSION (CATH LAB);  Surgeon: Antonieta Iba, MD;  Location: [facility] ORS;  Service: Cardiovascular;  Laterality: N/A;   COLONOSCOPY WITH PROPOFOL N/A 06/04/2015   Procedure: COLONOSCOPY WITH PROPOFOL;  Surgeon: Midge Minium, MD;  5 tubular adenomas   COLONOSCOPY WITH PROPOFOL N/A 11/22/2021   Procedure: COLONOSCOPY WITH PROPOFOL;  Surgeon: Midge Minium, MD;  Location: [facility];  Service: Endoscopy;  Laterality: N/A;  Diabetic   HIP ARTHROPLASTY Left 2013   LEG SKIN LESION  BIOPSY / EXCISION Right 2015   Hidradenitis   POLYPECTOMY  06/04/2015   Procedure: POLYPECTOMY;  Surgeon: Midge Minium, MD;  Location: [facility];  Service: Endoscopy;;   POLYPECTOMY  11/22/2021   Procedure: POLYPECTOMY;  Surgeon: Midge Minium, MD;  Location: [facility];  Service: Endoscopy;;   VARICOSE VEIN SURGERY Bilateral 2015   VIDEO ASSISTED THORACOSCOPY (VATS)/ LOBECTOMY Right 07/28/2022    Social History   Tobacco Use   Smoking status: Every Day    Current packs/day: 0.25    Average packs/day: 0.3 packs/day for 30.0 years (7.5 ttl pk-yrs)    Types: Cigarettes   Smokeless tobacco: Never  Vaping Use   Vaping status: Never Used  Substance Use Topics   Alcohol use: No    Alcohol/week: 4.0 standard drinks of alcohol     Types: 4 Standard drinks or equivalent per week   Drug use: No     Medication list has been reviewed and updated.  No outpatient medications have been marked as taking for the 07/17/23 encounter (Orders Only) with Reubin Milan, MD.       06/16/2023    3:53 PM 05/22/2023    4:00 PM 11/17/2022    3:29 PM 08/15/2022    2:02 PM  GAD 7 : Generalized Anxiety Score  Nervous, Anxious, on Edge 0 0 0 0  Control/stop worrying 0 0 0 0  Worry too much - different things 0 0 0 0  Trouble relaxing 0 0 0 0  Restless 0 0 0 0  Easily annoyed or irritable 0 0 0 0  Afraid - awful might happen 0 0 0 0  Total GAD 7 Score 0 0 0 0  Anxiety Difficulty Not difficult at all Not difficult at all Not difficult at all Not difficult at all       06/16/2023    3:53 PM 05/22/2023    4:00 PM 11/17/2022    3:29 PM  Depression screen PHQ 2/9  Decreased Interest 0 0 0  Down, Depressed, Hopeless 0 0 0  PHQ - 2 Score 0 0 0  Altered sleeping 1  1  Tired, decreased energy 1  1  Change in appetite 0  0  Feeling bad or failure about yourself  0  0  Trouble concentrating 0  0  Moving slowly or fidgety/restless 0  0  Suicidal thoughts 0  0  PHQ-9 Score 2  2  Difficult doing work/chores Not difficult at all  Not difficult at all    BP Readings from Last 3 Encounters:  06/16/23 118/68  05/22/23 108/76  11/17/22 112/70    Physical Exam  Wt Readings from Last 3 Encounters:  06/16/23 284 lb 8 oz (129 kg)  05/22/23 287 lb (130.2 kg)  11/17/22 283 lb (128.4 kg)    There were no vitals taken for this visit.  Assessment and Plan:  Problem List Items Addressed This Visit   None   No follow-ups on file.    Reubin Milan, MD [facility] [place]

## 2023-07-17 NOTE — Progress Notes (Unsigned)
 07/17/2023 Name: Jon Mendez MRN: [id number] DOB: 01-Aug-1960  Chief Complaint  Patient presents with   Medication Management   Medication Assistance   Medication Adherence    Jon Mendez is a 63 y.o. year old male who presented for a telephone visit.   Patient appearing on report for [employer] - Diabetes Control report due to last documented A1C of 8.8% on 05/22/2023. Next appointment with PCP is 09/18/2023    Outreached patient to discuss diabetes control and medication management. Speak with patient and spouse   Subjective:  Care Team: Primary Care Provider: Reubin Milan, MD ; Next Scheduled Visit: 09/18/2023 Oncologist: [facility]; Next Scheduled Visit: 07/30/2023 Cardiologist: Mirian Capuchin, MD  Medication Access/Adherence  Current Pharmacy:  [facility] [address] [place] - [address] [place] [address] Phone: [phone] Fax: [phone]   Patient reports affordability concerns with their medications: Yes  Patient reports access/transportation concerns to their pharmacy: No  Patient reports adherence concerns with their medications:  No    Uses weekly pillbox as filled by spouse; reports patient occasionally misses a dose of medication   Diabetes:  Current medications: Lantus 15 units daily  Medications tried in the past: metformin (stopped related to kidneys); Ozempic (cost)  Reports has been out of Ozempic since last year due to cost of the medication  Current glucose readings: recalls recent morning fasting readings ranging 119-127   Patient denies hypoglycemic s/sx including dizziness, shakiness, sweating.    Current medication access support: none   Atrial Fibrillation/Hypertension:  Current medications: - Rate Control: diltiazem CD 240 mg daily - Rhythm Control: amiodarone 200 mg daily - Anticoagulation Regimen: Xarelto 20 mg daily - losartan-HCTZ 100-25 mg daily  Reports has home  upper arm blood pressure monitor, but denies checking home BP recently  Patient denies hypotensive s/sx including dizziness, lightheadedness.     Objective:  Lab Results  Component Value Date   HGBA1C 8.8 (H) 05/22/2023    Lab Results  Component Value Date   CREATININE 1.79 (H) 05/22/2023   BUN 18 05/22/2023   NA 134 05/22/2023   K 4.4 05/22/2023   CL 97 05/22/2023   CO2 22 05/22/2023    Lab Results  Component Value Date   CHOL 222 (H) 05/14/2022   HDL 46 05/14/2022   LDLCALC 137 (H) 05/14/2022   TRIG 220 (H) 05/14/2022   CHOLHDL 4.8 05/14/2022   BP Readings from Last 3 Encounters:  06/16/23 118/68  05/22/23 108/76  11/17/22 112/70   Pulse Readings from Last 3 Encounters:  06/16/23 91  05/22/23 100  11/17/22 94     Medications Reviewed Today     Reviewed by Manuela Neptune, RPH-CPP (Pharmacist) on 07/17/23 at 1346  Med List Status: <None>   Medication Order Taking? Sig Documenting Provider Last Dose Status Informant  Adalimumab 40 MG/0.8ML PSKT [id number]  HUMIRA [provider]  Active   allopurinol (ZYLOPRIM) 100 MG tablet [id number] Yes Take 1 tablet (100 mg total) by mouth daily. Reubin Milan, MD Taking Active   amiodarone (PACERONE) 200 MG tablet [id number] Yes Take 1 tablet (200 mg total) by mouth daily. Reubin Milan, MD Taking Active   clindamycin (CLEOCIN T) 1 % lotion [id number] Yes Apply topically 2 (two) times daily as needed. [provider] Taking Active   diltiazem (CARDIZEM CD) 240 MG 24 hr capsule [id number] Yes Take 240 mg by mouth daily. [provider] Taking Active  glucose blood (FREESTYLE LITE) test strip [id number]  Use as instructed Reubin Milan, MD  Active   hydroxychloroquine (PLAQUENIL) 200 MG tablet [id number]  Take 200 mg by mouth daily. [provider]  Active   insulin glargine (LANTUS SOLOSTAR) 100 UNIT/ML Solostar Pen [id number]  INJECT 10-50 UNITS INTO THE SKIN DAILY.  Patient  taking differently: Inject 15 Units into the skin daily.   Reubin Milan, MD  Active   Insulin Pen Needle (PEN NEEDLES) 31G X 6 MM MISC [id number]  1 each by Does not apply route daily at 6 (six) AM. Reubin Milan, MD  Active   Lancets (FREESTYLE) lancets [id number]  Use as instructed Reubin Milan, MD  Active   losartan-hydrochlorothiazide [facility]) 100-25 MG tablet [id number] Yes Take 1 tablet by mouth daily. Reubin Milan, MD Taking Active   Multiple Vitamin (MULTIVITAMIN WITH MINERALS) TABS tablet [id number] Yes Take 1 tablet by mouth daily. Complete Multivitamin [provider] Taking Active Self  pravastatin (PRAVACHOL) 40 MG tablet [id number] Yes Take 1 tablet (40 mg total) by mouth daily. Reubin Milan, MD Taking Active   rivaroxaban Carlena Hurl) 20 MG TABS tablet [id number] Yes Take 1 tablet by mouth daily with breakfast. [provider] Taking Active   Semaglutide, 1 MG/DOSE, 4 MG/3ML SOPN [id number] No Inject 1 mg as directed once a week.  Patient not taking: Reported on 07/17/2023   Reubin Milan, MD Not Taking Active   tamsulosin [facility]) 0.4 MG CAPS capsule [id number] Yes Take 1 tablet by mouth daily as needed. [provider] Taking Active               Assessment/Plan:   Comprehensive medication review performed; medication list updated in electronic medical record  Based on reported household income, patient does not meet criteria for Ozempic patient assistance  Diabetes: - Currently uncontrolled - Reviewed long term cardiovascular and renal outcomes of uncontrolled blood sugar - Reviewed goal A1c, goal fasting, and goal 2 hour post prandial glucose - Reviewed dietary modifications including importance of having regular well-balanced meals and snacks throughout the day, while controlling carbohydrate portion sizes             Encourage patient to avoid consumption of sugary beverages - Patient denies personal or family history of  multiple endocrine neoplasia type 2, medullary thyroid cancer; personal history of pancreatitis or gallbladder disease. - Counsel on signs of hypoglycemia and monitoring - Recommend to check glucose, keep log of results and have this record to review at upcoming medical appointments. Patient to contact provider office sooner if needed for readings outside of established parameters or symptoms - Meets financial criteria for Ozempic patient assistance program through [employer]. Will collaborate with provider, [facility] team, and patient to pursue assistance.    Hypertension/Atrial Fibrillation: - Reviewed importance of adherence to anticoagulant for stroke prevention. - Reviewed appropriate blood pressure monitoring technique and reviewed goal blood pressure.  - Recommend to monitor home blood pressure, keep log of results and have this record to review at upcoming medical appointments. Patient to contact provider office sooner if needed for readings outside of established parameters or symptoms   Follow Up Plan: Clinical Pharmacist will follow up with patient by telephone on ***  Estelle Grumbles, PharmD, [facility] [phone]

## 2023-07-21 ENCOUNTER — Other Ambulatory Visit: Payer: Self-pay

## 2023-07-22 NOTE — Patient Instructions (Signed)
 Goals Addressed             This Visit's Progress    Pharmacy Goals       Please follow up with Byrd Cast at [facility] about completing application for patient assistance for Ozempic from manufacturer [employer].  [facility] [address] [place], [place] [address] [phone]  Thank you!  Arthur Lash, PharmD, [facility] [phone]

## 2023-07-27 ENCOUNTER — Other Ambulatory Visit: Payer: Self-pay

## 2023-07-27 ENCOUNTER — Other Ambulatory Visit: Admitting: Pharmacist

## 2023-07-28 ENCOUNTER — Other Ambulatory Visit: Payer: Self-pay

## 2023-08-05 ENCOUNTER — Telehealth: Payer: Self-pay | Admitting: Internal Medicine

## 2023-08-05 ENCOUNTER — Other Ambulatory Visit: Payer: Self-pay

## 2023-08-05 MED ORDER — OZEMPIC (0.25 OR 0.5 MG/DOSE) 2 MG/3ML ~~LOC~~ SOPN: 0.5000 | PEN_INJECTOR | SUBCUTANEOUS | 3 refills | Status: DC

## 2023-08-05 NOTE — Telephone Encounter (Signed)
 Spoke with patient, he was confused about being put back on metformin , because he said [facility] took him off of it and is not sure if he should be taking, the metformin , ozempic  and insulin  all at once. Would like come clarification.

## 2023-08-05 NOTE — Telephone Encounter (Signed)
 Copied from CRM [id number]. Topic: General - Call Back - No Documentation >> Aug 05, 2023  2:56 PM Rosaria Common wrote: Reason for CRM: Callback number is [phone] pt is returning phone call of Amarachukwu Lakatos CMA.

## 2023-08-05 NOTE — Telephone Encounter (Signed)
Called patient back.  See previous message.

## 2023-08-05 NOTE — Telephone Encounter (Signed)
 He needs to start Metformin  for diabetes uncontrolled.  He did not respond to my message so it should have been routed to clinical pool to call him.  I can not see that this occurred.  Please speak with him in person and let him know what I recommend.  Then let me know to send the new Rx.

## 2023-08-05 NOTE — Telephone Encounter (Signed)
 No answer to phone call. Left VM message call back to our office.

## 2023-08-06 ENCOUNTER — Other Ambulatory Visit: Payer: Self-pay | Admitting: Internal Medicine

## 2023-08-06 NOTE — Progress Notes (Unsigned)
 Date:  08/06/2023   Name:  Jon Mendez   DOB:  14-Jun-1960   MRN:  [id number]   Chief Complaint: No chief complaint on file.  HPI  Review of Systems   Lab Results  Component Value Date   NA 134 05/22/2023   K 4.4 05/22/2023   CO2 22 05/22/2023   GLUCOSE 141 (H) 05/22/2023   BUN 18 05/22/2023   CREATININE 1.79 (H) 05/22/2023   CALCIUM 9.5 05/22/2023   EGFR 42 (L) 05/22/2023   GFRNONAA 33 (L) 03/05/2020   Lab Results  Component Value Date   CHOL 222 (H) 05/14/2022   HDL 46 05/14/2022   LDLCALC 137 (H) 05/14/2022   TRIG 220 (H) 05/14/2022   CHOLHDL 4.8 05/14/2022   Lab Results  Component Value Date   TSH 2.430 05/06/2021   Lab Results  Component Value Date   HGBA1C 8.8 (H) 05/22/2023   Lab Results  Component Value Date   WBC 14.2 (H) 05/22/2023   HGB 10.7 (L) 05/22/2023   HCT 33.3 (L) 05/22/2023   MCV 72 (L) 05/22/2023   PLT 423 05/22/2023   Lab Results  Component Value Date   ALT 11 05/22/2023   AST 18 05/22/2023   ALKPHOS 140 (H) 05/22/2023   BILITOT 0.2 05/22/2023   No results found for: "25OHVITD2", "25OHVITD3", "VD25OH"   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   Hip pain, chronic, right 10/24/2020   Acquired thrombophilia (HCC) 06/20/2020   Type II diabetes mellitus with complication (HCC) 09/19/2015   Tubular adenoma of colon    Stage 3b chronic kidney disease (HCC) 04/25/2015   Chronic gouty arthritis 11/22/2014   Hyperlipidemia associated with type 2 diabetes  mellitus (HCC) 11/22/2014   Essential (primary) hypertension 11/22/2014   Compulsive tobacco user syndrome 11/22/2014   Varicose veins of both lower extremities 11/22/2014   Hidradenitis suppurativa 09/06/2012   Benign paroxysmal positional nystagmus 07/29/2004    Allergies  Allergen Reactions   Egg-Derived Products Shortness Of Breath    Throat swells. Patient able to eat products that contain egg just not whole egg alone.   Fish Allergy Shortness Of Breath    Throat swells   Acyclovir And Related     Past Surgical History:  Procedure Laterality Date   CARDIAC ELECTROPHYSIOLOGY MAPPING AND ABLATION  07/2021   CARDIOVERSION N/A 06/17/2018   Procedure: CARDIOVERSION (CATH LAB);  Surgeon: Devorah Fonder, MD;  Location: [facility] ORS;  Service: Cardiovascular;  Laterality: N/A;   COLONOSCOPY WITH PROPOFOL  N/A 06/04/2015   Procedure: COLONOSCOPY WITH PROPOFOL ;  Surgeon: Marnee Sink, MD;  5 tubular adenomas   COLONOSCOPY WITH PROPOFOL  N/A 11/22/2021   Procedure: COLONOSCOPY WITH PROPOFOL ;  Surgeon: Marnee Sink, MD;  Location: [facility];  Service: Endoscopy;  Laterality: N/A;  Diabetic   HIP ARTHROPLASTY Left 2013   LEG SKIN LESION  BIOPSY / EXCISION Right 2015   Hidradenitis   POLYPECTOMY  06/04/2015   Procedure: POLYPECTOMY;  Surgeon: Marnee Sink, MD;  Location: [facility];  Service: Endoscopy;;   POLYPECTOMY  11/22/2021   Procedure: POLYPECTOMY;  Surgeon: Marnee Sink, MD;  Location: [facility];  Service: Endoscopy;;   VARICOSE VEIN SURGERY Bilateral 2015   VIDEO ASSISTED THORACOSCOPY (VATS)/ LOBECTOMY Right 07/28/2022    Social History   Tobacco Use   Smoking status: Every Day    Current packs/day: 0.25    Average packs/day: 0.3 packs/day for 30.0 years (7.5 ttl pk-yrs)    Types: Cigarettes   Smokeless tobacco: Never  Vaping Use   Vaping status: Never Used  Substance Use Topics   Alcohol use: No    Alcohol/week: 4.0 standard drinks of alcohol     Types: 4 Standard drinks or equivalent per week   Drug use: No     Medication list has been reviewed and updated.  No outpatient medications have been marked as taking for the 08/06/23 encounter (Orders Only) with Sheron Dixons, MD.       06/16/2023    3:53 PM 05/22/2023    4:00 PM 11/17/2022    3:29 PM 08/15/2022    2:02 PM  GAD 7 : Generalized Anxiety Score  Nervous, Anxious, on Edge 0 0 0 0  Control/stop worrying 0 0 0 0  Worry too much - different things 0 0 0 0  Trouble relaxing 0 0 0 0  Restless 0 0 0 0  Easily annoyed or irritable 0 0 0 0  Afraid - awful might happen 0 0 0 0  Total GAD 7 Score 0 0 0 0  Anxiety Difficulty Not difficult at all Not difficult at all Not difficult at all Not difficult at all       06/16/2023    3:53 PM 05/22/2023    4:00 PM 11/17/2022    3:29 PM  Depression screen PHQ 2/9  Decreased Interest 0 0 0  Down, Depressed, Hopeless 0 0 0  PHQ - 2 Score 0 0 0  Altered sleeping 1  1  Tired, decreased energy 1  1  Change in appetite 0  0  Feeling bad or failure about yourself  0  0  Trouble concentrating 0  0  Moving slowly or fidgety/restless 0  0  Suicidal thoughts 0  0  PHQ-9 Score 2  2  Difficult doing work/chores Not difficult at all  Not difficult at all    BP Readings from Last 3 Encounters:  06/16/23 118/68  05/22/23 108/76  11/17/22 112/70    Physical Exam  Wt Readings from Last 3 Encounters:  06/16/23 284 lb 8 oz (129 kg)  05/22/23 287 lb (130.2 kg)  11/17/22 283 lb (128.4 kg)    There were no vitals taken for this visit.  Assessment and Plan:  Problem List Items Addressed This Visit   None   No follow-ups on file.    Sheron Dixons, MD [facility] Primary [facility] and [facility] Medicine [place]

## 2023-08-17 ENCOUNTER — Telehealth: Payer: Self-pay | Admitting: Pharmacist

## 2023-08-17 ENCOUNTER — Other Ambulatory Visit: Payer: Self-pay | Admitting: Pharmacist

## 2023-08-17 NOTE — Progress Notes (Unsigned)
   Outreach Note  08/17/2023 Name: Jon Mendez MRN: [id number] DOB: 06-02-1960  Referred by: Sheron Dixons, MD  Was unable to reach patient via telephone today and have left HIPAA compliant message with person answering the phone asking patient to return my call.    Follow Up Plan: Will collaborate with Care Guide to outreach to schedule follow up with me  Arthur Lash, PharmD, [facility] [phone]

## 2023-08-19 ENCOUNTER — Telehealth: Payer: Self-pay

## 2023-08-19 NOTE — Progress Notes (Signed)
 Complex Care Management Care Guide Note  08/19/2023 Name: ROSA GUILBAULT MRN: [id number] DOB: 13-Sep-1960  DEVYN FLOCCO is a 63 y.o. year old male who is a primary care patient of Sheron Dixons, MD and is actively engaged with the care management team. I reached out to Connor Deiters by phone today to assist with re-scheduling  with the Pharmacist.  Follow up plan: Patient declined rescheduling   Lenton Rail , RMA     [facility]  [facility] Guide  Direct Dial: [phone]  Website: [email or web address]

## 2023-09-10 DIAGNOSIS — D6489 Other specified anemias: Secondary | ICD-10-CM | POA: Insufficient documentation

## 2023-09-10 DIAGNOSIS — D61810 Antineoplastic chemotherapy induced pancytopenia: Secondary | ICD-10-CM | POA: Insufficient documentation

## 2023-09-10 LAB — PSA: PSA: 0.05

## 2023-09-10 LAB — COMPREHENSIVE METABOLIC PANEL WITH GFR
Calcium: 8.9 (ref 8.7–10.7)
eGFR: 57

## 2023-09-10 LAB — CBC AND DIFFERENTIAL
HCT: 21 — AB (ref 41–53)
Hemoglobin: 7 — AB (ref 13.5–17.5)
Platelets: 313 10*3/uL (ref 150–400)
WBC: 11.8

## 2023-09-10 LAB — BASIC METABOLIC PANEL WITH GFR
BUN: 10 (ref 4–21)
CO2: 23 — AB (ref 13–22)
Chloride: 106 (ref 99–108)
Creatinine: 1.4 — AB (ref 0.6–1.3)
Glucose: 142
Potassium: 3.8 meq/L (ref 3.5–5.1)
Sodium: 140 (ref 137–147)

## 2023-09-10 LAB — HEPATIC FUNCTION PANEL
ALT: 14 U/L (ref 10–40)
AST: 18 (ref 14–40)
Alkaline Phosphatase: 133 — AB (ref 25–125)

## 2023-09-15 ENCOUNTER — Telehealth: Payer: Self-pay

## 2023-09-15 DIAGNOSIS — Z860100 Personal history of colon polyps, unspecified: Secondary | ICD-10-CM

## 2023-09-15 DIAGNOSIS — K921 Melena: Secondary | ICD-10-CM

## 2023-09-15 NOTE — Telephone Encounter (Signed)
 Per Mylinda Asa at [facility], pt "experiences occasional hematochezia, described as pink and diluted blood in the toilet, with some on the toilet paper. There is no melena, and he does not take NSAIDs, only acetaminophen  and prescribed analgesics." They are requesting a colonoscopy.   Mylinda Asa confirms that patient is not taking Xarelto .  Dr. Lydia Sams advised on 09/10/23 to stop it until after scope.  Hgb on 06/05 was 7.  Can I schedule this, or does patient need to be evaluated by a GI provider first?   Please advise.  Thanks, [place], [place]

## 2023-09-15 NOTE — Telephone Encounter (Signed)
 Left voice message for patient to call office to schedule colonoscopy with Dr. Ole Berkeley.  Thanks, [place], [place]

## 2023-09-18 ENCOUNTER — Encounter: Admitting: Internal Medicine

## 2023-09-18 ENCOUNTER — Encounter: Payer: Self-pay | Admitting: Internal Medicine

## 2023-09-18 NOTE — Progress Notes (Deleted)
 Date:  09/18/2023   Name:  Jon Mendez   DOB:  1961/01/25   MRN:  [id number]   Chief Complaint: No chief complaint on file.  Diabetes    Review of Systems   Lab Results  Component Value Date   NA 140 09/10/2023   K 3.8 09/10/2023   CO2 23 (A) 09/10/2023   GLUCOSE 141 (H) 05/22/2023   BUN 10 09/10/2023   CREATININE 1.4 (A) 09/10/2023   CALCIUM 8.9 09/10/2023   EGFR 57 09/10/2023   GFRNONAA 33 (L) 03/05/2020   Lab Results  Component Value Date   CHOL 222 (H) 05/14/2022   HDL 46 05/14/2022   LDLCALC 137 (H) 05/14/2022   TRIG 220 (H) 05/14/2022   CHOLHDL 4.8 05/14/2022   Lab Results  Component Value Date   TSH 2.430 05/06/2021   Lab Results  Component Value Date   HGBA1C 8.8 (H) 05/22/2023   Lab Results  Component Value Date   WBC 11.8 09/10/2023   HGB 7.0 (A) 09/10/2023   HCT 21 (A) 09/10/2023   MCV 72 (L) 05/22/2023   PLT 313 09/10/2023   Lab Results  Component Value Date   ALT 14 09/10/2023   AST 18 09/10/2023   ALKPHOS 133 (A) 09/10/2023   BILITOT 0.2 05/22/2023   No results found for: Lucetta Russel, VD25OH   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   Hip pain, chronic, right 10/24/2020   Acquired thrombophilia (HCC) 06/20/2020   Type II diabetes mellitus with complication (HCC) 09/19/2015   Tubular adenoma of colon    Stage 3b chronic kidney disease (HCC) 04/25/2015   Chronic gouty arthritis 11/22/2014   Hyperlipidemia associated with type 2 diabetes  mellitus (HCC) 11/22/2014   Essential (primary) hypertension 11/22/2014   Compulsive tobacco user syndrome 11/22/2014   Varicose veins of both lower extremities 11/22/2014   Hidradenitis suppurativa 09/06/2012   Benign paroxysmal positional nystagmus 07/29/2004    Allergies  Allergen Reactions   Egg-Derived Products Shortness Of Breath    Throat swells. Patient able to eat products that contain egg just not whole egg alone.   Fish Allergy Shortness Of Breath    Throat swells   Acyclovir And Related     Past Surgical History:  Procedure Laterality Date   CARDIAC ELECTROPHYSIOLOGY MAPPING AND ABLATION  07/2021   CARDIOVERSION N/A 06/17/2018   Procedure: CARDIOVERSION (CATH LAB);  Surgeon: Devorah Fonder, MD;  Location: [facility];  Service: Cardiovascular;  Laterality: N/A;   COLONOSCOPY WITH PROPOFOL  N/A 06/04/2015   Procedure: COLONOSCOPY WITH PROPOFOL ;  Surgeon: Marnee Sink, MD;  5 tubular adenomas   COLONOSCOPY WITH PROPOFOL  N/A 11/22/2021   Procedure: COLONOSCOPY WITH PROPOFOL ;  Surgeon: Marnee Sink, MD;  Location: [facility];  Service: Endoscopy;  Laterality: N/A;  Diabetic   HIP ARTHROPLASTY Left 2013   LEG SKIN LESION  BIOPSY / EXCISION Right 2015   Hidradenitis   POLYPECTOMY  06/04/2015   Procedure: POLYPECTOMY;  Surgeon: Marnee Sink, MD;  Location: [facility];  Service: Endoscopy;;   POLYPECTOMY  11/22/2021   Procedure: POLYPECTOMY;  Surgeon: Marnee Sink, MD;  Location: [facility];  Service: Endoscopy;;   VARICOSE VEIN SURGERY Bilateral 2015   VIDEO ASSISTED THORACOSCOPY (VATS)/ LOBECTOMY Right 07/28/2022    Social History   Tobacco Use   Smoking status: Every Day    Current packs/day: 0.25    Average packs/day: 0.3 packs/day for 30.0 years (7.5 ttl pk-yrs)    Types: Cigarettes   Smokeless tobacco: Never  Vaping Use   Vaping status: Never Used  Substance Use Topics   Alcohol use: No    Alcohol/week: 4.0 standard drinks of alcohol     Types: 4 Standard drinks or equivalent per week   Drug use: No     Medication list has been reviewed and updated.  No outpatient medications have been marked as taking for the 09/18/23 encounter (Office Visit) with Sheron Dixons, MD.       06/16/2023    3:53 PM 05/22/2023    4:00 PM 11/17/2022    3:29 PM 08/15/2022    2:02 PM  GAD 7 : Generalized Anxiety Score  Nervous, Anxious, on Edge 0 0 0 0  Control/stop worrying 0 0 0 0  Worry too much - different things 0 0 0 0  Trouble relaxing 0 0 0 0  Restless 0 0 0 0  Easily annoyed or irritable 0 0 0 0  Afraid - awful might happen 0 0 0 0  Total GAD 7 Score 0 0 0 0  Anxiety Difficulty Not difficult at all Not difficult at all Not difficult at all Not difficult at all       06/16/2023    3:53 PM 05/22/2023    4:00 PM 11/17/2022    3:29 PM  Depression screen PHQ 2/9  Decreased Interest 0 0 0  Down, Depressed, Hopeless 0 0 0  PHQ - 2 Score 0 0 0  Altered sleeping 1  1  Tired, decreased energy 1  1  Change in appetite 0  0  Feeling bad or failure about yourself  0  0  Trouble concentrating 0  0  Moving slowly or fidgety/restless 0  0  Suicidal thoughts 0  0  PHQ-9 Score 2  2  Difficult doing work/chores Not difficult at all  Not difficult at all    BP Readings from Last 3 Encounters:  06/16/23 118/68  05/22/23 108/76  11/17/22 112/70    Physical Exam  Wt Readings from Last 3 Encounters:  06/16/23 284 lb 8 oz (129 kg)  05/22/23 287 lb (130.2 kg)  11/17/22 283 lb (128.4 kg)    There were no vitals taken for this visit.  Assessment and Plan:  Problem List Items Addressed This Visit   None   No follow-ups on file.    Sheron Dixons, MD [facility] [place]

## 2023-09-21 ENCOUNTER — Encounter: Payer: Self-pay | Admitting: Internal Medicine

## 2023-09-21 ENCOUNTER — Other Ambulatory Visit: Payer: Self-pay

## 2023-09-21 DIAGNOSIS — Z860100 Personal history of colon polyps, unspecified: Secondary | ICD-10-CM

## 2023-09-21 DIAGNOSIS — K921 Melena: Secondary | ICD-10-CM

## 2023-09-21 MED ORDER — NA SULFATE-K SULFATE-MG SULF 17.5-3.13-1.6 GM/177ML PO SOLN
1.0000 | Freq: Once | ORAL | 0 refills | Status: AC
Start: 2023-09-21 — End: 2023-09-21

## 2023-09-21 NOTE — Progress Notes (Signed)
 Error

## 2023-09-21 NOTE — Addendum Note (Signed)
 Addended by: Leellen Puller on: 09/21/2023 04:18 PM   Modules accepted: Orders

## 2023-09-21 NOTE — Telephone Encounter (Signed)
 Patients wife Glenora Laos) returned phone call for her husband to schedule colonoscopy.  DPR Checked.  Colonoscopy has been scheduled with Dr. Alica Inks on 10/06/23.  Instructions sent via mychart.  Gastroenterology Pre-Procedure Review  Request Date: 10/06/23 Requesting Physician: Dr. Ole Berkeley  PATIENT REVIEW QUESTIONS: The patient responded to the following health history questions as indicated:    1. Are you having any GI issues?  Per Mylinda Asa at [facility], pt experiences occasional hematochezia, described as pink and diluted blood in the toilet, with some on the toilet paper. There is no melena, and he does not take NSAIDs, only acetaminophen  and prescribed analgesics. They are requesting a colonoscopy.    2. Do you have a personal history of Polyps? yes (last colonoscopy performed by Dr. Ole Berkeley  11/22/21 recommended repeat in 3 years however due to GI issues noted by [facility] Dr. Ole Berkeley agrees to repeat colonoscopy sooner) 3. Do you have a family history of Colon Cancer or Polyps? no 4. Diabetes Mellitus? no 5. Joint replacements in the past 12 months?no 6. Major health problems in the past 3 months?no 7. Any artificial heart valves, MVP, or defibrillator?no    MEDICATIONS & ALLERGIES:    Patient reports the following regarding taking any anticoagulation/antiplatelet therapy:   Plavix, Coumadin, Eliquis , Xarelto , Lovenox , Pradaxa, Brilinta, or Effient? yes (09/15/23 Mylinda Asa at [facility] stated that Dr. Lydia Sams advised patient to stop Xarelto  on 09/10/23 until after scope.  Pts wife confirmed that her husband has not taken Xarelto  since advised to stop ) Aspirin? no  Patient confirms/reports the following medications:  Current Outpatient Medications  Medication Sig Dispense Refill   Adalimumab 40 MG/0.8ML PSKT HUMIRA     allopurinol  (ZYLOPRIM ) 100 MG tablet Take 1 tablet (100 mg total) by mouth daily. 90 tablet 1   amiodarone  (PACERONE ) 200 MG tablet Take 1 tablet (200 mg total) by mouth  daily. 90 tablet 1   clindamycin  (CLEOCIN  T) 1 % lotion Apply topically 2 (two) times daily as needed.     diltiazem  (CARDIZEM  CD) 240 MG 24 hr capsule Take 240 mg by mouth daily.     glucose blood (FREESTYLE LITE) test strip Use as instructed 100 each 12   hydroxychloroquine (PLAQUENIL) 200 MG tablet Take 200 mg by mouth daily.     insulin  glargine (LANTUS  SOLOSTAR) 100 UNIT/ML Solostar Pen INJECT 10-50 UNITS INTO THE SKIN DAILY. (Patient taking differently: Inject 15 Units into the skin daily.) 45 mL 1   Insulin  Pen Needle (PEN NEEDLES) 31G X 6 MM MISC 1 each by Does not apply route daily at 6 (six) AM. 100 each 0   Lancets (FREESTYLE) lancets Use as instructed 100 each 12   losartan -hydrochlorothiazide  (HYZAAR) 100-25 MG tablet Take 1 tablet by mouth daily. 90 tablet 1   Multiple Vitamin (MULTIVITAMIN WITH MINERALS) TABS tablet Take 1 tablet by mouth daily. Complete Multivitamin     pravastatin  (PRAVACHOL ) 40 MG tablet Take 1 tablet (40 mg total) by mouth daily. 90 tablet 1   rivaroxaban  (XARELTO ) 20 MG TABS tablet Take 1 tablet by mouth daily with breakfast.     tamsulosin (FLOMAX) 0.4 MG CAPS capsule Take 1 tablet by mouth daily as needed.     No current facility-administered medications for this visit.    Patient confirms/reports the following allergies:  Allergies  Allergen Reactions   Egg-Derived Products Shortness Of Breath    Throat swells. Patient able to eat products that contain egg just not whole egg alone.   Fish Allergy  Shortness Of Breath    Throat swells   Acyclovir And Related     No orders of the defined types were placed in this encounter.   AUTHORIZATION INFORMATION Primary Insurance: 1D#: Group #:  Secondary Insurance: 1D#: Group #:  SCHEDULE INFORMATION: Date: 10/06/23 Time: Location: [facility]

## 2023-10-05 ENCOUNTER — Telehealth: Payer: Self-pay

## 2023-10-05 ENCOUNTER — Encounter: Payer: Self-pay | Admitting: Gastroenterology

## 2023-10-05 NOTE — Telephone Encounter (Signed)
 Pt requested call back concerning his copay for his scheduled colonoscopy tomorrow with Dr. Jinny.  Pt has been advised that he can still have his colonoscopy as scheduled with Dr. Jinny and that his insurance will be billed first and anything not covered by the insurance he will receive a bill.  At that time payment arrangements can be made by calling the number on the back of his billing letter.  Thanks,  Judith [place], CMA

## 2023-10-06 ENCOUNTER — Encounter
Admission: RE | Disposition: A | Discharge status: Home / Self Care | Payer: Self-pay | Source: Home / Self Care | Attending: Gastroenterology

## 2023-10-06 ENCOUNTER — Encounter: Payer: Self-pay | Admitting: Gastroenterology

## 2023-10-06 ENCOUNTER — Ambulatory Visit
Admission: RE | Admit: 2023-10-06 | Discharge: 2023-10-06 | Disposition: A | Discharge status: Home / Self Care | Attending: Gastroenterology | Admitting: Gastroenterology

## 2023-10-06 ENCOUNTER — Ambulatory Visit: Admitting: Anesthesiology

## 2023-10-06 ENCOUNTER — Other Ambulatory Visit: Payer: Self-pay

## 2023-10-06 DIAGNOSIS — I129 Hypertensive chronic kidney disease with stage 1 through stage 4 chronic kidney disease, or unspecified chronic kidney disease: Secondary | ICD-10-CM | POA: Diagnosis not present

## 2023-10-06 DIAGNOSIS — E1122 Type 2 diabetes mellitus with diabetic chronic kidney disease: Secondary | ICD-10-CM | POA: Insufficient documentation

## 2023-10-06 DIAGNOSIS — Z1211 Encounter for screening for malignant neoplasm of colon: Secondary | ICD-10-CM | POA: Diagnosis present

## 2023-10-06 DIAGNOSIS — Z79899 Other long term (current) drug therapy: Secondary | ICD-10-CM | POA: Diagnosis not present

## 2023-10-06 DIAGNOSIS — D122 Benign neoplasm of ascending colon: Secondary | ICD-10-CM | POA: Diagnosis not present

## 2023-10-06 DIAGNOSIS — F17210 Nicotine dependence, cigarettes, uncomplicated: Secondary | ICD-10-CM | POA: Insufficient documentation

## 2023-10-06 DIAGNOSIS — Z860100 Personal history of colon polyps, unspecified: Secondary | ICD-10-CM

## 2023-10-06 DIAGNOSIS — I10 Essential (primary) hypertension: Secondary | ICD-10-CM | POA: Insufficient documentation

## 2023-10-06 DIAGNOSIS — K635 Polyp of colon: Secondary | ICD-10-CM | POA: Insufficient documentation

## 2023-10-06 DIAGNOSIS — D123 Benign neoplasm of transverse colon: Secondary | ICD-10-CM | POA: Diagnosis not present

## 2023-10-06 DIAGNOSIS — Z794 Long term (current) use of insulin: Secondary | ICD-10-CM | POA: Insufficient documentation

## 2023-10-06 DIAGNOSIS — I480 Paroxysmal atrial fibrillation: Secondary | ICD-10-CM | POA: Diagnosis not present

## 2023-10-06 DIAGNOSIS — K641 Second degree hemorrhoids: Secondary | ICD-10-CM | POA: Diagnosis not present

## 2023-10-06 DIAGNOSIS — N183 Chronic kidney disease, stage 3 unspecified: Secondary | ICD-10-CM | POA: Diagnosis not present

## 2023-10-06 DIAGNOSIS — K921 Melena: Secondary | ICD-10-CM

## 2023-10-06 HISTORY — DX: Paroxysmal atrial fibrillation: I48.0

## 2023-10-06 HISTORY — PX: POLYPECTOMY: SHX149

## 2023-10-06 HISTORY — PX: COLONOSCOPY: SHX5424

## 2023-10-06 SURGERY — COLONOSCOPY
Anesthesia: General

## 2023-10-06 MED ORDER — LIDOCAINE HCL (PF) 2 % IJ SOLN
INTRAMUSCULAR | Status: DC | PRN
  Administered 2023-10-06: 100 mg via INTRADERMAL

## 2023-10-06 MED ORDER — PROPOFOL 10 MG/ML IV BOLUS
INTRAVENOUS | Status: DC | PRN
  Administered 2023-10-06: 100 ug/kg/min via INTRAVENOUS
  Administered 2023-10-06: 140 mg via INTRAVENOUS

## 2023-10-06 MED ORDER — SODIUM CHLORIDE 0.9 % IV SOLN: INTRAVENOUS | Status: DC

## 2023-10-06 MED ORDER — ONDANSETRON HCL 4 MG/2ML IJ SOLN
INTRAMUSCULAR | Status: AC
Start: 2023-10-06 — End: 2023-10-06
  Filled 2023-10-06: qty 2

## 2023-10-06 MED ORDER — PROPOFOL 10 MG/ML IV BOLUS
INTRAVENOUS | Status: AC
Start: 2023-10-06 — End: 2023-10-06
  Filled 2023-10-06: qty 40

## 2023-10-06 MED ORDER — DEXAMETHASONE SODIUM PHOSPHATE 10 MG/ML IJ SOLN
INTRAMUSCULAR | Status: AC
  Filled 2023-10-06: qty 1

## 2023-10-06 NOTE — Op Note (Signed)
 [facility] Patient Name: Jon Mendez Procedure Date: 10/06/2023 9:16 AM MRN: [id number] Account #: [id number] Date of Birth: 1961-01-28 Admit Type: Outpatient Age: 63 Room: [facility] ENDO ROOM 4 Gender: Male Note Status: Finalized Instrument Name: Arvis [id number] Procedure:             Colonoscopy Indications:           High risk colon cancer surveillance: Personal history                         of colonic polyps Providers:             Rogelia Copping MD, MD Referring MD:          Leita Adie, MD (Referring MD) Medicines:             Propofol  per Anesthesia Complications:         No immediate complications. Procedure:             Pre-Anesthesia Assessment:                        - Prior to the procedure, a History and Physical was                         performed, and patient medications and allergies were                         reviewed. The patient's tolerance of previous                         anesthesia was also reviewed. The risks and benefits                         of the procedure and the sedation options and risks                         were discussed with the patient. All questions were                         answered, and informed consent was obtained. Prior                         Anticoagulants: The patient has taken no anticoagulant                         or antiplatelet agents. ASA Grade Assessment: II - A                         patient with mild systemic disease. After reviewing                         the risks and benefits, the patient was deemed in                         satisfactory condition to undergo the procedure.                        After obtaining informed consent, the colonoscope was  passed under direct vision. Throughout the procedure,                         the patient's blood pressure, pulse, and oxygen                          saturations were monitored continuously. The                          Colonoscope was introduced through the anus and                         advanced to the the cecum, identified by appendiceal                         orifice and ileocecal valve. The colonoscopy was                         performed without difficulty. The patient tolerated                         the procedure well. The quality of the bowel                         preparation was excellent. Findings:      The perianal and digital rectal examinations were normal.      A 6 mm polyp was found in the ascending colon. The polyp was       pedunculated. The polyp was removed with a cold snare. Resection and       retrieval were complete.      A 7 mm polyp was found in the transverse colon. The polyp was       pedunculated. The polyp was removed with a cold snare. Resection and       retrieval were complete.      Two sessile polyps were found in the sigmoid colon. The polyps were 3 to       4 mm in size. These polyps were removed with a cold snare. Resection and       retrieval were complete.      Non-bleeding internal hemorrhoids were found during retroflexion. The       hemorrhoids were Grade II (internal hemorrhoids that prolapse but reduce       spontaneously). Impression:            - One 6 mm polyp in the ascending colon, removed with                         a cold snare. Resected and retrieved.                        - One 7 mm polyp in the transverse colon, removed with                         a cold snare. Resected and retrieved.                        - Two 3 to 4 mm polyps in the sigmoid colon, removed  with a cold snare. Resected and retrieved.                        - Non-bleeding internal hemorrhoids. Recommendation:        - Discharge patient to home.                        - Resume previous diet.                        - Continue present medications.                        - Await pathology results.                        - Repeat  colonoscopy in 5 years for surveillance. Procedure Code(s):     --- Professional ---                        [id number], Colonoscopy, flexible; with removal of                         tumor(s), polyp(s), or other lesion(s) by snare                         technique Diagnosis Code(s):     --- Professional ---                        Z86.010, Personal history of colonic polyps                        D12.5, Benign neoplasm of sigmoid colon CPT copyright 2022 American Medical Association. All rights reserved. The codes documented in this report are preliminary and upon coder review may  be revised to meet current compliance requirements. Rogelia Copping MD, MD 10/06/2023 9:43:30 AM This report has been signed electronically. Number of Addenda: 0 Note Initiated On: 10/06/2023 9:16 AM Scope Withdrawal Time: 0 hours 8 minutes 0 seconds  Total Procedure Duration: 0 hours 11 minutes 22 seconds  Estimated Blood Loss:  Estimated blood loss: none.      [facility]

## 2023-10-06 NOTE — Anesthesia Postprocedure Evaluation (Signed)
 Anesthesia Post Note  Patient: Jon Mendez  Procedure(s) Performed: COLONOSCOPY POLYPECTOMY, INTESTINE  Patient location during evaluation: Endoscopy Anesthesia Type: General Level of consciousness: awake and alert Pain management: pain level controlled Vital Signs Assessment: post-procedure vital signs reviewed and stable Respiratory status: spontaneous breathing, nonlabored ventilation, respiratory function stable and patient connected to nasal cannula oxygen  Cardiovascular status: blood pressure returned to baseline and stable Postop Assessment: no apparent nausea or vomiting Anesthetic complications: no   No notable events documented.   Last Vitals:  Vitals:   10/06/23 0947 10/06/23 0957  BP: 129/73 138/80  Pulse: 86 80  Resp: 12 16  Temp: (!) 35.9 C   SpO2: 99% 100%    Last Pain:  Vitals:   10/06/23 0957  TempSrc:   PainSc: 0-No pain                 Lendia LITTIE Mae

## 2023-10-06 NOTE — Anesthesia Preprocedure Evaluation (Signed)
 Anesthesia Evaluation  Patient identified by MRN, date of birth, ID band Patient awake    Reviewed: Allergy & Precautions, NPO status , Patient's Chart, lab work & pertinent test results  History of Anesthesia Complications Negative for: history of anesthetic complications  Airway Mallampati: III  TM Distance: >3 FB Neck ROM: full    Dental  (+) Poor Dentition, Missing   Pulmonary neg pulmonary ROS, Current Smoker and Patient abstained from smoking.   Pulmonary exam normal        Cardiovascular hypertension, On Medications + dysrhythmias Atrial Fibrillation      Neuro/Psych negative neurological ROS  negative psych ROS   GI/Hepatic negative GI ROS, Neg liver ROS,,,  Endo/Other  negative endocrine ROSdiabetes    Renal/GU Renal disease  negative genitourinary   Musculoskeletal   Abdominal   Peds  Hematology negative hematology ROS (+)   Anesthesia Other Findings Past Medical History: No date: CKD (chronic kidney disease), stage III (HCC) No date: Diabetes mellitus without complication (HCC) No date: H/O hidradenitis suppurativa No date: H/O varicose veins No date: High cholesterol No date: Hypertension No date: Paroxysmal atrial fibrillation (HCC) No date: Prostate CA (HCC) No date: Vertigo  Past Surgical History: 07/2021: CARDIAC ELECTROPHYSIOLOGY MAPPING AND ABLATION 06/17/2018: CARDIOVERSION; N/A     Comment:  Procedure: CARDIOVERSION (CATH LAB);  Surgeon: Perla Evalene PARAS, MD;  Location: [facility];  Service:               Cardiovascular;  Laterality: N/A; 06/04/2015: COLONOSCOPY WITH PROPOFOL ; N/A     Comment:  Procedure: COLONOSCOPY WITH PROPOFOL ;  Surgeon: Rogelia Copping, MD;  5 tubular adenomas 11/22/2021: COLONOSCOPY WITH PROPOFOL ; N/A     Comment:  Procedure: COLONOSCOPY WITH PROPOFOL ;  Surgeon: Copping Rogelia, MD;  Location: [facility];  Service:                Endoscopy;  Laterality: N/A;  Diabetic 2013: HIP ARTHROPLASTY; Left 2015: LEG SKIN LESION  BIOPSY / EXCISION; Right     Comment:  Hidradenitis No date: LUNG LOBECTOMY 06/04/2015: POLYPECTOMY     Comment:  Procedure: POLYPECTOMY;  Surgeon: Rogelia Copping, MD;                Location: [facility];  Service: Endoscopy;; 11/22/2021: POLYPECTOMY     Comment:  Procedure: POLYPECTOMY;  Surgeon: Copping Rogelia, MD;                Location: [facility];  Service: Endoscopy;; 2015: VARICOSE VEIN SURGERY; Bilateral 07/28/2022: VIDEO ASSISTED THORACOSCOPY (VATS)/ LOBECTOMY; Right     Reproductive/Obstetrics negative OB ROS                             Anesthesia Physical Anesthesia Plan  ASA: 3  Anesthesia Plan: General   Post-op Pain Management: Minimal or no pain anticipated   Induction: Intravenous  PONV Risk Score and Plan: 1 and Propofol  infusion and TIVA  Airway Management Planned: Natural Airway and Nasal Cannula  Additional Equipment:   Intra-op Plan:   Post-operative Plan:   Informed Consent: I have reviewed the patients History and Physical, chart, labs and discussed the procedure including the risks, benefits and alternatives for the  proposed anesthesia with the patient or authorized representative who has indicated his/her understanding and acceptance.     Dental Advisory Given  Plan Discussed with: Anesthesiologist, CRNA and Surgeon  Anesthesia Plan Comments: (Patient consented for risks of anesthesia including but not limited to:  - adverse reactions to medications - risk of airway placement if required - damage to eyes, teeth, lips or other oral mucosa - nerve damage due to positioning  - sore throat or hoarseness - Damage to heart, brain, nerves, lungs, other parts of body or loss of life  Patient voiced understanding and assent.)       Anesthesia Quick Evaluation

## 2023-10-06 NOTE — Transfer of Care (Signed)
 Immediate Anesthesia Transfer of Care Note  Patient: Jon Mendez  Procedure(s) Performed: COLONOSCOPY POLYPECTOMY, INTESTINE  Patient Location: Endoscopy Unit  Anesthesia Type:General  Level of Consciousness: awake, alert , and oriented  Airway & Oxygen  Therapy: Patient Spontanous Breathing and Patient connected to face mask oxygen   Post-op Assessment: Report given to RN and Post -op Vital signs reviewed and stable  Post vital signs: Reviewed and stable  Last Vitals:  Vitals Value Taken Time  BP 129/73 10/06/23 09:48  Temp 35.9 C 10/06/23 09:47  Pulse 86 10/06/23 09:49  Resp 22 10/06/23 09:49  SpO2 100 % 10/06/23 09:49  Vitals shown include unfiled device data.  Last Pain:  Vitals:   10/06/23 0947  TempSrc:   PainSc: 0-No pain         Complications: No notable events documented.

## 2023-10-06 NOTE — H&P (Signed)
 Rogelia Copping, MD [facility] [address] [place], [place] [address] [phone] Fax : [phone]  Primary Care Physician:  Justus Leita DEL, MD Primary Gastroenterologist:  Dr. Copping  Pre-Procedure History & Physical: HPI:  Jon Mendez is a 63 y.o. male is here for an colonoscopy.   Past Medical History:  Diagnosis Date   CKD (chronic kidney disease), stage III (HCC)    Diabetes mellitus without complication (HCC)    H/O hidradenitis suppurativa    H/O varicose veins    High cholesterol    Hypertension    Paroxysmal atrial fibrillation (HCC)    Prostate CA (HCC)    Vertigo     Past Surgical History:  Procedure Laterality Date   CARDIAC ELECTROPHYSIOLOGY MAPPING AND ABLATION  07/2021   CARDIOVERSION N/A 06/17/2018   Procedure: CARDIOVERSION (CATH LAB);  Surgeon: Perla Evalene PARAS, MD;  Location: [facility];  Service: Cardiovascular;  Laterality: N/A;   COLONOSCOPY WITH PROPOFOL  N/A 06/04/2015   Procedure: COLONOSCOPY WITH PROPOFOL ;  Surgeon: Rogelia Copping, MD;  5 tubular adenomas   COLONOSCOPY WITH PROPOFOL  N/A 11/22/2021   Procedure: COLONOSCOPY WITH PROPOFOL ;  Surgeon: Copping Rogelia, MD;  Location: [facility];  Service: Endoscopy;  Laterality: N/A;  Diabetic   HIP ARTHROPLASTY Left 2013   LEG SKIN LESION  BIOPSY / EXCISION Right 2015   Hidradenitis   LUNG LOBECTOMY     POLYPECTOMY  06/04/2015   Procedure: POLYPECTOMY;  Surgeon: Rogelia Copping, MD;  Location: [facility];  Service: Endoscopy;;   POLYPECTOMY  11/22/2021   Procedure: POLYPECTOMY;  Surgeon: Copping Rogelia, MD;  Location: [facility];  Service: Endoscopy;;   VARICOSE VEIN SURGERY Bilateral 2015   VIDEO ASSISTED THORACOSCOPY (VATS)/ LOBECTOMY Right 07/28/2022    Prior to Admission medications   Medication Sig Start Date End Date Taking? Authorizing Provider  Adalimumab 40 MG/0.8ML PSKT HUMIRA 01/14/21  Yes [provider]  allopurinol  (ZYLOPRIM ) 100 MG tablet  Take 1 tablet (100 mg total) by mouth daily. 06/16/23  Yes Justus Leita DEL, MD  amiodarone  (PACERONE ) 200 MG tablet Take 1 tablet (200 mg total) by mouth daily. 06/16/23  Yes Justus Leita DEL, MD  diltiazem  (CARDIZEM  CD) 240 MG 24 hr capsule Take 240 mg by mouth daily. 02/09/23  Yes [provider]  hydroxychloroquine (PLAQUENIL) 200 MG tablet Take 200 mg by mouth daily. 11/25/20  Yes [provider]  insulin  glargine (LANTUS  SOLOSTAR) 100 UNIT/ML Solostar Pen INJECT 10-50 UNITS INTO THE SKIN DAILY. Patient taking differently: Inject 15 Units into the skin daily. 07/10/23  Yes Justus Leita DEL, MD  losartan -hydrochlorothiazide  (HYZAAR) 100-25 MG tablet Take 1 tablet by mouth daily. 05/22/23  Yes Justus Leita DEL, MD  metoprolol  tartrate (LOPRESSOR ) 100 MG tablet Take 100 mg by mouth 2 (two) times daily.   Yes [provider]  Multiple Vitamin (MULTIVITAMIN WITH MINERALS) TABS tablet Take 1 tablet by mouth daily. Complete Multivitamin   Yes [provider]  pravastatin  (PRAVACHOL ) 40 MG tablet Take 1 tablet (40 mg total) by mouth daily. 05/22/23  Yes Justus Leita DEL, MD  SEMAGLUTIDE , 1 MG/DOSE, [place] Inject 1 mg into the skin.   Yes [provider]  tamsulosin (FLOMAX) 0.4 MG CAPS capsule Take 1 tablet by mouth daily as needed. 01/29/22  Yes [provider]  clindamycin  (CLEOCIN  T) 1 % lotion Apply topically 2 (two) times daily as needed. 11/29/20   [provider]  glucose blood (FREESTYLE LITE) test strip Use as  instructed 06/20/20   Justus Leita DEL, MD  Insulin  Pen Needle (PEN NEEDLES) 31G X 6 MM MISC 1 each by Does not apply route daily at 6 (six) AM. 06/16/23   Justus Leita DEL, MD  Lancets (FREESTYLE) lancets Use as instructed 06/20/20   Justus Leita DEL, MD  rivaroxaban  (XARELTO ) 20 MG TABS tablet Take 1 tablet by mouth daily with breakfast. 05/08/21   [provider]    Allergies as of 09/21/2023 - Review Complete 09/21/2023   Allergen Reaction Noted   Egg-derived products Shortness Of Breath 11/22/2014   Fish allergy Shortness Of Breath 04/25/2015   Acyclovir and related  11/22/2021    Family History  Problem Relation Age of Onset   Hypertension Mother    Lymphoma Father    Diabetes Sister    Diabetes Brother    Diabetes Brother     Social History   Socioeconomic History   Marital status: Married    Spouse name: Not on file   Number of children: Not on file   Years of education: Not on file   Highest education level: Not on file  Occupational History   Not on file  Tobacco Use   Smoking status: Every Day    Current packs/day: 0.25    Average packs/day: 0.3 packs/day for 30.0 years (7.5 ttl pk-yrs)    Types: Cigarettes   Smokeless tobacco: Never  Vaping Use   Vaping status: Never Used  Substance and Sexual Activity   Alcohol use: No    Alcohol/week: 4.0 standard drinks of alcohol    Types: 4 Standard drinks or equivalent per week   Drug use: No   Sexual activity: Yes  Other Topics Concern   Not on file  Social History Narrative   Not on file   Social Drivers of Health   Financial Resource Strain: Low Risk  (10/23/2022)   Overall Financial Resource Strain (CARDIA)    Difficulty of Paying Living Expenses: Not very hard  Recent Concern: Financial Resource Strain - Medium Risk (07/31/2022)   [facility]   Overall Financial Resource Strain (CARDIA)    Difficulty of Paying Living Expenses: Somewhat hard  Food Insecurity: No Food Insecurity (07/30/2023)   Received from [facility]   Hunger Vital Sign    Within the past 12 months, you worried that your food would run out before you got the money to buy more.: Never true    Within the past 12 months, the food you bought just didn't last and you didn't have money to get more.: Never true  Transportation Needs: No Transportation Needs (07/30/2023)   Received from [facility]   PRAPARE - Transportation    Lack of  Transportation (Medical): No    Lack of Transportation (Non-Medical): No  Physical Activity: Insufficiently Active (10/23/2022)   Exercise Vital Sign    Days of Exercise per Week: 3 days    Minutes of Exercise per Session: 30 min  Stress: No Stress Concern Present (10/23/2022)   [employer] of Occupational Health - Occupational Stress Questionnaire    Feeling of Stress : Not at all  Social Connections: Moderately Integrated (10/23/2022)   Social Connection and Isolation Panel    Frequency of Communication with Friends and Family: More than three times a week    Frequency of Social Gatherings with Friends and Family: Twice a week    Attends Religious Services: More than 4 times per year    Active Member of  Clubs or Organizations: No    Attends Banker Meetings: Never    Marital Status: Married  Catering manager Violence: Not At Risk (09/10/2023)   Received from [facility]   Humiliation, Afraid, Rape, and Kick questionnaire    Within the last year, have you been afraid of your partner or ex-partner?: No    Within the last year, have you been humiliated or emotionally abused in other ways by your partner or ex-partner?: No    Within the last year, have you been kicked, hit, slapped, or otherwise physically hurt by your partner or ex-partner?: No    Within the last year, have you been raped or forced to have any kind of sexual activity by your partner or ex-partner?: No    Review of Systems: See HPI, otherwise negative ROS  Physical Exam: BP (!) 171/84   Pulse 94   Temp (!) 96.6 F (35.9 C) (Temporal)   Resp 20   Ht 6' (1.829 m)   Wt 120.2 kg   SpO2 100%   BMI 35.94 kg/m  General:   Alert,  pleasant and cooperative in NAD Head:  Normocephalic and atraumatic. Neck:  Supple; no masses or thyromegaly. Lungs:  Clear throughout to auscultation.    Heart:  Regular rate and rhythm. Abdomen:  Soft, nontender and nondistended. Normal bowel sounds, without guarding,  and without rebound.   Neurologic:  Alert and  oriented x4;  grossly normal neurologically.  Impression/Plan: Jon Mendez is here for an colonoscopy to be performed for a history of adenomatous polyps on 2023   Risks, benefits, limitations, and alternatives regarding  colonoscopy have been reviewed with the patient.  Questions have been answered.  All parties agreeable.   Rogelia Copping, MD  10/06/2023, 8:46 AM

## 2023-10-08 ENCOUNTER — Ambulatory Visit: Payer: Self-pay | Admitting: Gastroenterology

## 2023-10-08 LAB — SURGICAL PATHOLOGY

## 2023-10-26 NOTE — Progress Notes (Signed)
 [facility] Quality Team Note  Name: Jon Mendez Date of Birth: 04/04/1961 MRN: [id number] Date: 10/26/2023  [facility] Quality Team has reviewed this patient's chart, please see recommendations below:  [facility] Quality Other; (CHART REVIEWED FOR GSD. ABSTRACTED 05/24/2023 A1C FOR GAP CLOSURE.)

## 2023-10-26 NOTE — Progress Notes (Signed)
 [facility] Quality Team Note  Name: Jon Mendez Date of Birth: 1960/11/09 MRN: [id number] Date: 10/26/2023  [facility] Quality Team has reviewed this patient's chart, please see recommendations below:  [facility] Quality Other; (CHART REVIEWED FOR CBP, ABSTRACTED 10/15/2023 BP FOR GAP CLOSURE)

## 2023-10-28 ENCOUNTER — Ambulatory Visit: Payer: Medicare HMO | Admitting: Emergency Medicine

## 2023-10-28 VITALS — Ht 72.0 in | Wt 265.0 lb

## 2023-10-28 DIAGNOSIS — Z0000 Encounter for general adult medical examination without abnormal findings: Secondary | ICD-10-CM

## 2023-10-28 NOTE — Progress Notes (Signed)
 Subjective:   Jon Mendez is a 63 y.o. who presents for a Medicare Wellness preventive visit.  As a reminder, Annual Wellness Visits don't include a physical exam, and some assessments may be limited, especially if this visit is performed virtually. We may recommend an in-person follow-up visit with your provider if needed.  Visit Complete: Virtual I connected with  Jon Mendez on 10/28/23 by a audio enabled telemedicine application and verified that I am speaking with the correct person using two identifiers.  Patient Location: Home  Provider Location: Home Office  I discussed the limitations of evaluation and management by telemedicine. The patient expressed understanding and agreed to proceed.  Vital Signs: Because this visit was a virtual/telehealth visit, some criteria may be missing or patient reported. Any vitals not documented were not able to be obtained and vitals that have been documented are patient reported.  VideoError- Librarian, academic were attempted between this provider and patient, however failed, due to patient having technical difficulties OR patient did not have access to video capability.  We continued and completed visit with audio only.   Persons Participating in Visit: Patient.  AWV Questionnaire: No: Patient Medicare AWV questionnaire was not completed prior to this visit.  Cardiac Risk Factors include: advanced age (>64men, >20 women);male gender;dyslipidemia;diabetes mellitus;hypertension;obesity (BMI >30kg/m2);smoking/ tobacco exposure     Objective:    Today's Vitals   10/28/23 1557  Weight: 265 lb (120.2 kg)  Height: 6' (1.829 m)   Body mass index is 35.94 kg/m.     10/28/2023    4:24 PM 10/06/2023    8:39 AM 10/23/2022    8:39 AM 11/22/2021    9:38 AM 06/17/2018    7:06 AM 05/04/2018   12:42 AM 05/03/2018    8:57 PM  Advanced Directives  Does Patient Have a Medical Advance Directive? No No No No No  No   No   Would patient like information on creating a medical advance directive? Yes (MAU/Ambulatory/Procedural Areas - Information given) No - Patient declined No - Patient declined No - Patient declined Yes (MAU/Ambulatory/Procedural Areas - Information given)  No - Patient declined  No - Patient declined      Data saved with a previous flowsheet row definition    Current Medications (verified) Outpatient Encounter Medications as of 10/28/2023  Medication Sig   allopurinol  (ZYLOPRIM ) 100 MG tablet Take 1 tablet (100 mg total) by mouth daily.   amiodarone  (PACERONE ) 200 MG tablet Take 1 tablet (200 mg total) by mouth daily.   bimekizumab-bkzx (BIMZELX) 160 MG/ML pen Inject 320 mg into the skin.   clindamycin  (CLEOCIN  T) 1 % lotion Apply topically 2 (two) times daily as needed.   clindamycin  (CLEOCIN ) 150 MG capsule Take 300 mg by mouth 4 (four) times daily.   clindamycin  (CLEOCIN ) 300 MG capsule Take 300 mg by mouth 2 (two) times daily. (Patient taking differently: Take 300 mg by mouth 2 (two) times daily. Hasn't started yet, will start after taking 150mg  2 tablet 4 times per day)   dexamethasone  (DECADRON ) 4 MG tablet Take 8 mg by mouth.   diltiazem  (CARDIZEM  CD) 240 MG 24 hr capsule Take 240 mg by mouth daily.   diphenhydrAMINE (BENADRYL) 12.5 MG/5ML liquid Mix equal parts diphenhydramine, lidocaine , and liquid antacid. Swish 5 to 10mLs in mouth for 60 seconds, every 4 to 6 hours as needed, then swallow or spit mixture out (as directed by prescriber). Shake well before using. Refrigerate after mixing and  discard after 14 days.   glucose blood (FREESTYLE LITE) test strip Use as instructed   HYDROcodone -Acetaminophen  10-300 MG TABS Take 1 tablet by mouth every 4 (four) hours as needed (pain).   hydroxychloroquine (PLAQUENIL) 200 MG tablet Take 200 mg by mouth daily.   insulin  glargine (LANTUS  SOLOSTAR) 100 UNIT/ML Solostar Pen INJECT 10-50 UNITS INTO THE SKIN DAILY. (Patient taking differently: 15  units daily)   Insulin  Pen Needle (PEN NEEDLES) 31G X 6 MM MISC 1 each by Does not apply route daily at 6 (six) AM.   Lancets (FREESTYLE) lancets Use as instructed   losartan -hydrochlorothiazide  (HYZAAR) 100-25 MG tablet Take 1 tablet by mouth daily.   metoprolol  tartrate (LOPRESSOR ) 100 MG tablet Take 100 mg by mouth 2 (two) times daily. (Patient taking differently: Take 100 mg by mouth 2 (two) times daily. PRN)   Multiple Vitamin (MULTIVITAMIN WITH MINERALS) TABS tablet Take 1 tablet by mouth daily. Complete Multivitamin   OLANZapine (ZYPREXA) 5 MG tablet Take 5 mg by mouth.   ondansetron  (ZOFRAN ) 8 MG tablet Take 8 mg by mouth every 8 (eight) hours as needed.   pravastatin  (PRAVACHOL ) 40 MG tablet Take 1 tablet (40 mg total) by mouth daily.   rivaroxaban  (XARELTO ) 20 MG TABS tablet Take 1 tablet by mouth daily with breakfast.   SEMAGLUTIDE , 1 MG/DOSE, [place] Inject 1 mg into the skin.   tamsulosin (FLOMAX) 0.4 MG CAPS capsule Take 1 tablet by mouth daily as needed.   Adalimumab 40 MG/0.8ML PSKT HUMIRA (Patient not taking: Reported on 10/28/2023)   folic acid (FOLVITE) 1 MG tablet Take 1 mg by mouth daily.   No facility-administered encounter medications on file as of 10/28/2023.    Allergies (verified) Egg-derived products, Fish allergy, and Acyclovir and related   History: Past Medical History:  Diagnosis Date   CKD (chronic kidney disease), stage III (HCC)    Diabetes mellitus without complication (HCC)    H/O hidradenitis suppurativa    H/O varicose veins    High cholesterol    Hypertension    Paroxysmal atrial fibrillation (HCC)    Prostate CA (HCC)    Vertigo    Past Surgical History:  Procedure Laterality Date   CARDIAC ELECTROPHYSIOLOGY MAPPING AND ABLATION  07/2021   CARDIOVERSION N/A 06/17/2018   Procedure: CARDIOVERSION (CATH LAB);  Surgeon: Perla Evalene PARAS, MD;  Location: [facility];  Service: Cardiovascular;  Laterality: N/A;   COLONOSCOPY N/A 10/06/2023   Procedure:  COLONOSCOPY;  Surgeon: Jinny Carmine, MD;  Location: [facility];  Service: Endoscopy;  Laterality: N/A;   COLONOSCOPY WITH PROPOFOL  N/A 06/04/2015   Procedure: COLONOSCOPY WITH PROPOFOL ;  Surgeon: Carmine Jinny, MD;  5 tubular adenomas   COLONOSCOPY WITH PROPOFOL  N/A 11/22/2021   Procedure: COLONOSCOPY WITH PROPOFOL ;  Surgeon: Jinny Carmine, MD;  Location: [facility];  Service: Endoscopy;  Laterality: N/A;  Diabetic   HIP ARTHROPLASTY Left 2013   LEG SKIN LESION  BIOPSY / EXCISION Right 2015   Hidradenitis   LUNG LOBECTOMY     POLYPECTOMY  06/04/2015   Procedure: POLYPECTOMY;  Surgeon: Carmine Jinny, MD;  Location: [facility];  Service: Endoscopy;;   POLYPECTOMY  11/22/2021   Procedure: POLYPECTOMY;  Surgeon: Jinny Carmine, MD;  Location: [facility];  Service: Endoscopy;;   POLYPECTOMY  10/06/2023   Procedure: POLYPECTOMY, INTESTINE;  Surgeon: Jinny Carmine, MD;  Location: [facility];  Service: Endoscopy;;   VARICOSE VEIN SURGERY Bilateral 2015   VIDEO ASSISTED THORACOSCOPY (VATS)/ LOBECTOMY Right 07/28/2022  Family History  Problem Relation Age of Onset   Hypertension Mother    Lymphoma Father    Diabetes Sister    Diabetes Brother    Diabetes Brother    Social History   Socioeconomic History   Marital status: Married    Spouse name: Verneita   Number of children: 4   Years of education: Not on file   Highest education level: Not on file  Occupational History   Occupation: disability  Tobacco Use   Smoking status: Every Day    Current packs/day: 0.50    Average packs/day: 0.5 packs/day for 46.6 years (23.3 ttl pk-yrs)    Types: Cigarettes    Start date: 1979   Smokeless tobacco: Never   Tobacco comments:    10/28/23 smoking 7 cigarettes per day/pbt  Vaping Use   Vaping status: Never Used  Substance and Sexual Activity   Alcohol use: No    Alcohol/week: 4.0 standard drinks of alcohol    Types: 4 Standard drinks or equivalent per week   Drug  use: No   Sexual activity: Yes  Other Topics Concern   Not on file  Social History Narrative   Not on file   Social Drivers of Health   Financial Resource Strain: Low Risk  (10/28/2023)   Overall Financial Resource Strain (CARDIA)    Difficulty of Paying Living Expenses: Not hard at all  Food Insecurity: No Food Insecurity (10/28/2023)   Hunger Vital Sign    Worried About Running Out of Food in the Last Year: Never true    Ran Out of Food in the Last Year: Never true  Transportation Needs: No Transportation Needs (10/28/2023)   PRAPARE - Administrator, Civil Service (Medical): No    Lack of Transportation (Non-Medical): No  Physical Activity: Inactive (10/28/2023)   Exercise Vital Sign    Days of Exercise per Week: 0 days    Minutes of Exercise per Session: 0 min  Stress: No Stress Concern Present (10/28/2023)   [employer] of Occupational Health - Occupational Stress Questionnaire    Feeling of Stress: Not at all  Social Connections: Moderately Integrated (10/28/2023)   Social Connection and Isolation Panel    Frequency of Communication with Friends and Family: More than three times a week    Frequency of Social Gatherings with Friends and Family: More than three times a week    Attends Religious Services: More than 4 times per year    Active Member of [employer] or Organizations: No    Attends Banker Meetings: Never    Marital Status: Married    Tobacco Counseling Ready to quit: Not Answered Counseling given: Not Answered Tobacco comments: 10/28/23 smoking 7 cigarettes per day/pbt    Clinical Intake:  Pre-visit preparation completed: Yes  Pain : No/denies pain     BMI - recorded: 35.94 Nutritional Status: BMI > 30  Obese Nutritional Risks: None Diabetes: Yes CBG done?: No Did pt. bring in CBG monitor from home?: No  Lab Results  Component Value Date   HGBA1C 8.8 (H) 05/22/2023   HGBA1C 8.0 (H) 08/15/2022   HGBA1C 7.3 (H) 05/14/2022      How often do you need to have someone help you when you read instructions, pamphlets, or other written materials from your doctor or pharmacy?: 1 - Never  Interpreter Needed?: No  Information entered by :: Vina Ned, CMA   Activities of Daily Living     10/28/2023  4:00 PM  In your present state of health, do you have any difficulty performing the following activities:  Hearing? 0  Vision? 0  Difficulty concentrating or making decisions? 0  Walking or climbing stairs? 1  Comment sometimes due to leg weakness  Dressing or bathing? 0  Doing errands, shopping? 0  Preparing Food and eating ? N  Using the Toilet? N  In the past six months, have you accidently leaked urine? Y  Comment history of prostate cancer  Do you have problems with loss of bowel control? N  Managing your Medications? Y  Comment wife fills pill box  Managing your Finances? N  Housekeeping or managing your Housekeeping? N    Patient Care Team: Justus Leita DEL, MD as PCP - General (Internal Medicine) Loreli Elspeth Agent, MD as Referring Physician (Ophthalmology) Jinny Carmine, MD as Consulting Physician (Gastroenterology) Florencio Cara BIRCH, MD as Consulting Physician (Cardiology) Maryl Isaiah Fitch, PA-C (Dermatology) Tobie Excell Honer, MD as Referring Physician (Hematology and Oncology) Keane Franky Blunt, MD as Referring Physician (Radiation Oncology) Haithcock, Morene BRAVO, MD (Cardiothoracic Surgery)  I have updated your Care Teams any recent Medical Services you may have received from other providers in the past year.     Assessment:   This is a routine wellness examination for Jon Mendez.  Hearing/Vision screen Hearing Screening - Comments:: Denies hearing loss  Vision Screening - Comments:: Gets DM eye exams, Dr. Elspeth Loreli, [facility]   Goals Addressed               This Visit's Progress     Quit Smoking (pt-stated)         Depression Screen     10/28/2023     4:22 PM 06/16/2023    3:53 PM 05/22/2023    4:00 PM 11/17/2022    3:29 PM 10/23/2022    8:38 AM 08/15/2022    2:02 PM 05/14/2022    9:20 AM  PHQ 2/9 Scores  PHQ - 2 Score 0 0 0 0 0 0 0  PHQ- 9 Score 2 2  2  0 4 4    Fall Risk     10/28/2023    4:29 PM 06/16/2023    3:52 PM 05/22/2023    4:00 PM 11/17/2022    3:29 PM 10/23/2022    8:40 AM  Fall Risk   Falls in the past year? 0 0 0 0 0  Number falls in past yr: 0 0 0 0 0  Injury with Fall? 0 0 0 0 0  Risk for fall due to : No Fall Risks No Fall Risks No Fall Risks History of fall(s) No Fall Risks  Follow up Falls evaluation completed Falls evaluation completed Falls evaluation completed Falls evaluation completed Falls prevention discussed;Falls evaluation completed    MEDICARE RISK AT HOME:  Medicare Risk at Home Any stairs in or around the home?: Yes (also has a ramp) If so, are there any without handrails?: No Home free of loose throw rugs in walkways, pet beds, electrical cords, etc?: Yes Adequate lighting in your home to reduce risk of falls?: Yes Life alert?: No Use of a cane, walker or w/c?: No Grab bars in the bathroom?: No Shower chair or bench in shower?: Yes Elevated toilet seat or a handicapped toilet?: Yes  TIMED UP AND GO:  Was the test performed?  No  Cognitive Function: 6CIT completed        10/28/2023    4:30 PM 10/23/2022    8:42  AM  6CIT Screen  What Year? 0 points 0 points  What month? 0 points 0 points  What time? 0 points 0 points  Count back from 20 0 points 0 points  Months in reverse 0 points 0 points  Repeat phrase 0 points 0 points  Total Score 0 points 0 points    Immunizations Immunization History  Administered Date(s) Administered   PFIZER(Purple Top)SARS-COV-2 Vaccination 06/23/2019, 07/14/2019, 03/06/2020   PNEUMOCOCCAL CONJUGATE-20 05/14/2022   Pneumococcal Polysaccharide-23 04/28/2016   Tdap 04/09/2011    Screening Tests Health Maintenance  Topic Date Due   Zoster Vaccines-  Shingrix (1 of 2) Never done   DTaP/Tdap/Td (2 - Td or Tdap) 04/08/2021   OPHTHALMOLOGY EXAM  01/18/2022   COVID-19 Vaccine (4 - 2024-25 season) 12/07/2022   INFLUENZA VACCINE  11/06/2023   HEMOGLOBIN A1C  11/19/2023   Diabetic kidney evaluation - Urine ACR  05/21/2024   FOOT EXAM  06/15/2024   Diabetic kidney evaluation - eGFR measurement  09/09/2024   Medicare Annual Wellness (AWV)  10/27/2024   Colonoscopy  10/05/2028   Pneumococcal Vaccine 32-52 Years old  Completed   Hepatitis C Screening  Completed   Hepatitis B Vaccines  Aged Out   HPV VACCINES  Aged Out   Meningococcal B Vaccine  Aged Out    Health Maintenance  Health Maintenance Due  Topic Date Due   Zoster Vaccines- Shingrix (1 of 2) Never done   DTaP/Tdap/Td (2 - Td or Tdap) 04/08/2021   OPHTHALMOLOGY EXAM  01/18/2022   COVID-19 Vaccine (4 - 2024-25 season) 12/07/2022   Health Maintenance Items Addressed: See Nurse Notes at the end of this note  Additional Screening:  Vision Screening: Recommended annual ophthalmology exams for early detection of glaucoma and other disorders of the eye. Would you like a referral to an eye doctor? No    Dental Screening: Recommended annual dental exams for proper oral hygiene  Community Resource Referral / Chronic Care Management: CRR required this visit?  No   CCM required this visit?  No   Plan:    I have personally reviewed and noted the following in the patient's chart:   Medical and social history Use of alcohol, tobacco or illicit drugs  Current medications and supplements including opioid prescriptions. Patient is currently taking opioid prescriptions. Information provided to patient regarding non-opioid alternatives. Patient advised to discuss non-opioid treatment plan with their provider. Functional ability and status Nutritional status Physical activity Advanced directives List of other physicians Hospitalizations, surgeries, and ER visits in previous 12  months Vitals Screenings to include cognitive, depression, and falls Referrals and appointments  In addition, I have reviewed and discussed with patient certain preventive protocols, quality metrics, and best practice recommendations. A written personalized care plan for preventive services as well as general preventive health recommendations were provided to patient.   Vina Ned, CMA   10/28/2023   After Visit Summary: (Mail) Due to this being a telephonic visit, the after visit summary with patients personalized plan was offered to patient via mail   Notes:  Requested most recent DM eye exam Rescheduled No Show appt from 09/18/23 to 10/30/23

## 2023-10-28 NOTE — Patient Instructions (Signed)
 Jon Mendez , Thank you for taking time out of your busy schedule to complete your Annual Wellness Visit with me. I enjoyed our conversation and look forward to speaking with you again next year. I, as well as your care team,  appreciate your ongoing commitment to your health goals. Please review the following plan we discussed and let me know if I can assist you in the future. Your Game plan/ To Do List    Referrals: None   Follow up Visits: Next Medicare AWV with our clinical staff: 11/02/24 @ 3:20pm (PHONE VISIT)   Have you seen your provider in the last 6 months (3 months if uncontrolled diabetes)? Yes Next Office Visit with your provider: 10/30/23 @ 3:00pm with Dr. Justus  Clinician Recommendations: Get a diabetic eye exam every year.  Aim for 30 minutes of exercise or brisk walking, 6-8 glasses of water , and 5 servings of fruits and vegetables each day.       This is a list of the screening recommended for you and due dates:  Health Maintenance  Topic Date Due   Zoster (Shingles) Vaccine (1 of 2) Never done   DTaP/Tdap/Td vaccine (2 - Td or Tdap) 04/08/2021   Eye exam for diabetics  01/18/2022   COVID-19 Vaccine (4 - 2024-25 season) 12/07/2022   Flu Shot  11/06/2023   Hemoglobin A1C  11/19/2023   Yearly kidney health urinalysis for diabetes  05/21/2024   Complete foot exam   06/15/2024   Yearly kidney function blood test for diabetes  09/09/2024   Medicare Annual Wellness Visit  10/27/2024   Colon Cancer Screening  10/05/2028   Pneumococcal Vaccination  Completed   Hepatitis C Screening  Completed   Hepatitis B Vaccine  Aged Out   HPV Vaccine  Aged Out   Meningitis B Vaccine  Aged Out    Advanced directives: (Provided) Advance directive discussed with you today. I have provided a copy for you to complete at home and have notarized. Once this is complete, please bring a copy in to our office so we can scan it into your chart.  Advance Care Planning is important because  it:  [x]  Makes sure you receive the medical care that is consistent with your values, goals, and preferences  [x]  It provides guidance to your family and loved ones and reduces their decisional burden about whether or not they are making the right decisions based on your wishes.  Follow the link provided in your after visit summary or read over the paperwork we have mailed to you to help you started getting your Advance Directives in place. If you need assistance in completing these, please reach out to us  so that we can help you!  See attachments for Preventive Care and Fall Prevention Tips.   Steps to Quit Smoking Smoking tobacco is the leading cause of preventable death. It can affect almost every organ in the body. Smoking puts you and those around you at risk for developing many serious chronic diseases. Quitting smoking can be very challenging. Do not get discouraged if you are not successful the first time. Some people need to make many attempts to quit before they achieve long-term success. Do your best to stick to your quit plan, and talk with your health care provider if you have any questions or concerns. How do I get ready to quit? When you decide to quit smoking, create a plan to help you succeed. Before you quit: Pick a date to quit. Set  a date within the next 2 weeks to give you time to prepare. Write down the reasons why you are quitting. Keep this list in places where you will see it often. Tell your family, friends, and co-workers that you are quitting. Support from people you are close to can make quitting easier. Talk with your health care provider about your options for quitting smoking. Find out what treatment options are covered by your health insurance. Identify people, places, things, and activities that make you want to smoke (triggers). Avoid them. What first steps can I take to quit smoking? Throw away all cigarettes at home, at work, and in your car. Throw away  smoking accessories, such as Set designer. Clean your car. Make sure to empty the ashtray. Clean your home, including curtains and carpets. What strategies can I use to quit smoking? Talk with your health care provider about combining strategies, such as taking medicines while you are also receiving in-person counseling. Using these two strategies together makes you more likely to succeed in quitting than if you used either strategy on its own. If you are pregnant or breastfeeding, talk with your health care provider about finding counseling or other support strategies to quit smoking. Do not take medicine to help you quit smoking unless your health care provider tells you to. Quit right away Quit smoking completely, instead of gradually reducing how much you smoke over a period of time. Stopping smoking right away may be more successful than gradually quitting. Attend in-person counseling to help you build problem-solving skills. You are more likely to succeed in quitting if you attend counseling sessions regularly. Even short sessions of 10 minutes can be effective. Take medicine You may take medicines to help you quit smoking. Some medicines require a prescription. You can also purchase over-the-counter medicines. Medicines may have nicotine  in them to replace the nicotine  in cigarettes. Medicines may: Help to stop cravings. Help to relieve withdrawal symptoms. Your health care provider may recommend: Nicotine  patches, gum, or lozenges. Nicotine  inhalers or sprays. Non-nicotine  medicine that you take by mouth. Find resources Find resources and support systems that can help you quit smoking and remain smoke-free after you quit. These resources are most helpful when you use them often. They include: Online chats with a Veterinary surgeon. Telephone quitlines. Printed Materials engineer. Support groups or group counseling. Text messaging programs. Mobile phone apps or applications. Use apps  that can help you stick to your quit plan by providing reminders, tips, and encouragement. Examples of free services include Quit Guide from the CDC and smokefree.gov  What can I do to make it easier to quit?  Reach out to your family and friends for support and encouragement. Call telephone quitlines, such as 1-800-QUIT-NOW, reach out to support groups, or work with a counselor for support. Ask people who smoke to avoid smoking around you. Avoid places that trigger you to smoke, such as bars, parties, or smoke-break areas at work. Spend time with people who do not smoke. Lessen the stress in your life. Stress can be a smoking trigger for some people. To lessen stress, try: Exercising regularly. Doing deep-breathing exercises. Doing yoga. Meditating. What benefits will I see if I quit smoking? Over time, you should start to see positive results, such as: Improved sense of smell and taste. Decreased coughing and sore throat. Slower heart rate. Lower blood pressure. Clearer and healthier skin. The ability to breathe more easily. Fewer sick days. Summary Quitting smoking can be very challenging. Do not  get discouraged if you are not successful the first time. Some people need to make many attempts to quit before they achieve long-term success. When you decide to quit smoking, create a plan to help you succeed. Quit smoking right away, not slowly over a period of time. Find resources and support systems that can help you quit smoking and remain smoke-free after you quit. This information is not intended to replace advice given to you by your health care provider. Make sure you discuss any questions you have with your health care provider. Document Revised: 03/15/2021 Document Reviewed: 03/15/2021 Elsevier Patient Education  2024 [employer].   Fall Prevention in the Home, Adult Falls can cause injuries and affect people of all ages. There are many simple things that you can do to make  your home safe and to help prevent falls. If you need it, ask for help making these changes. What actions can I take to prevent falls? General information Use good lighting in all rooms. Make sure to: Replace any light bulbs that burn out. Turn on lights if it is dark and use night-lights. Keep items that you use often in easy-to-reach places. Lower the shelves around your home if needed. Move furniture so that there are clear paths around it. Do not keep throw rugs or other things on the floor that can make you trip. If any of your floors are uneven, fix them. Add color or contrast paint or tape to clearly mark and help you see: Grab bars or handrails. First and last steps of staircases. Where the edge of each step is. If you use a ladder or stepladder: Make sure that it is fully opened. Do not climb a closed ladder. Make sure the sides of the ladder are locked in place. Have someone hold the ladder while you use it. Know where your pets are as you move through your home. What can I do in the bathroom?     Keep the floor dry. Clean up any water  that is on the floor right away. Remove soap buildup in the bathtub or shower. Buildup makes bathtubs and showers slippery. Use non-skid mats or decals on the floor of the bathtub or shower. Attach bath mats securely with double-sided, non-slip rug tape. If you need to sit down while you are in the shower, use a non-slip stool. Install grab bars by the toilet and in the bathtub and shower. Do not use towel bars as grab bars. What can I do in the bedroom? Make sure that you have a light by your bed that is easy to reach. Do not use any sheets or blankets on your bed that hang to the floor. Have a firm bench or chair with side arms that you can use for support when you get dressed. What can I do in the kitchen? Clean up any spills right away. If you need to reach something above you, use a sturdy step stool that has a grab bar. Keep  electrical cables out of the way. Do not use floor polish or wax that makes floors slippery. What can I do with my stairs? Do not leave anything on the stairs. Make sure that you have a light switch at the top and the bottom of the stairs. Have them installed if you do not have them. Make sure that there are handrails on both sides of the stairs. Fix handrails that are broken or loose. Make sure that handrails are as long as the staircases. Install non-slip  stair treads on all stairs in your home if they do not have carpet. Avoid having throw rugs at the top or bottom of stairs, or secure the rugs with carpet tape to prevent them from moving. Choose a carpet design that does not hide the edge of steps on the stairs. Make sure that carpet is firmly attached to the stairs. Fix any carpet that is loose or worn. What can I do on the outside of my home? Use bright outdoor lighting. Repair the edges of walkways and driveways and fix any cracks. Clear paths of anything that can make you trip, such as tools or rocks. Add color or contrast paint or tape to clearly mark and help you see high doorway thresholds. Trim any bushes or trees on the main path into your home. Check that handrails are securely fastened and in good repair. Both sides of all steps should have handrails. Install guardrails along the edges of any raised decks or porches. Have leaves, snow, and ice cleared regularly. Use sand, salt, or ice melt on walkways during winter months if you live where there is ice and snow. In the garage, clean up any spills right away, including grease or oil spills. What other actions can I take? Review your medicines with your health care provider. Some medicines can make you confused or feel dizzy. This can increase your chance of falling. Wear closed-toe shoes that fit well and support your feet. Wear shoes that have rubber soles and low heels. Use a cane, walker, scooter, or crutches that help you move  around if needed. Talk with your provider about other ways that you can decrease your risk of falls. This may include seeing a physical therapist to learn to do exercises to improve movement and strength. Where to find more information Centers for Disease Control and Prevention, STEADI: TonerPromos.no [employer] on Aging: BaseRingTones.pl National Institute on Aging: BaseRingTones.pl Contact a health care provider if: You are afraid of falling at home. You feel weak, drowsy, or dizzy at home. You fall at home. Get help right away if you: Lose consciousness or have trouble moving after a fall. Have a fall that causes a head injury. These symptoms may be an emergency. Get help right away. Call 911. Do not wait to see if the symptoms will go away. Do not drive yourself to the hospital. This information is not intended to replace advice given to you by your health care provider. Make sure you discuss any questions you have with your health care provider. Document Revised: 11/25/2021 Document Reviewed: 11/25/2021 Elsevier Patient Education  2024 Elsevier Inc.   Managing Pain Without Opioids Opioids are strong medicines used to treat moderate to severe pain. For some people, especially those who have long-term (chronic) pain, opioids may not be the best choice for pain management due to: Side effects like nausea, constipation, and sleepiness. The risk of addiction (opioid use disorder). The longer you take opioids, the greater your risk of addiction. Pain that lasts for more than 3 months is called chronic pain. Managing chronic pain usually requires more than one approach and is often provided by a team of health care providers working together (multidisciplinary approach). Pain management may be done at a pain management center or pain clinic. How to manage pain without the use of opioids Use non-opioid medicines Non-opioid medicines for pain may include: Over-the-counter or prescription  non-steroidal anti-inflammatory drugs (NSAIDs). These may be the first medicines used for pain. They work well for muscle  and bone pain, and they reduce swelling. Acetaminophen . This over-the-counter medicine may work well for milder pain but not swelling. Antidepressants. These may be used to treat chronic pain. A certain type of antidepressant (tricyclics) is often used. These medicines are given in lower doses for pain than when used for depression. Anticonvulsants. These are usually used to treat seizures but may also reduce nerve (neuropathic) pain. Muscle relaxants. These relieve pain caused by sudden muscle tightening (spasms). You may also use a pain medicine that is applied to the skin as a patch, cream, or gel (topical analgesic), such as a numbing medicine. These may cause fewer side effects than medicines taken by mouth. Do certain therapies as directed Some therapies can help with pain management. They include: Physical therapy. You will do exercises to gain strength and flexibility. A physical therapist may teach you exercises to move and stretch parts of your body that are weak, stiff, or painful. You can learn these exercises at physical therapy visits and practice them at home. Physical therapy may also involve: Massage. Heat wraps or applying heat or cold to affected areas. Electrical signals that interrupt pain signals (transcutaneous electrical nerve stimulation, TENS). Weak lasers that reduce pain and swelling (low-level laser therapy). Signals from your body that help you learn to regulate pain (biofeedback). Occupational therapy. This helps you to learn ways to function at home and work with less pain. Recreational therapy. This involves trying new activities or hobbies, such as a physical activity or drawing. Mental health therapy, including: Cognitive behavioral therapy (CBT). This helps you learn coping skills for dealing with pain. Acceptance and commitment therapy (ACT)  to change the way you think and react to pain. Relaxation therapies, including muscle relaxation exercises and mindfulness-based stress reduction. Pain management counseling. This may be individual, family, or group counseling.  Receive medical treatments Medical treatments for pain management include: Nerve block injections. These may include a pain blocker and anti-inflammatory medicines. You may have injections: Near the spine to relieve chronic back or neck pain. Into joints to relieve back or joint pain. Into nerve areas that supply a painful area to relieve body pain. Into muscles (trigger point injections) to relieve some painful muscle conditions. A medical device placed near your spine to help block pain signals and relieve nerve pain or chronic back pain (spinal cord stimulation device). Acupuncture. Follow these instructions at home Medicines Take over-the-counter and prescription medicines only as told by your health care provider. If you are taking pain medicine, ask your health care providers about possible side effects to watch out for. Do not drive or use heavy machinery while taking prescription opioid pain medicine. Lifestyle  Do not use drugs or alcohol to reduce pain. If you drink alcohol, limit how much you have to: 0-1 drink a day for women who are not pregnant. 0-2 drinks a day for men. Know how much alcohol is in a drink. In the [place] one drink equals one 12 oz bottle of beer (355 mL), one 5 oz glass of wine (148 mL), or one 1 oz glass of hard liquor (44 mL). Do not use any products that contain nicotine  or tobacco. These products include cigarettes, chewing tobacco, and vaping devices, such as e-cigarettes. If you need help quitting, ask your health care provider. Eat a healthy diet and maintain a healthy weight. Poor diet and excess weight may make pain worse. Eat foods that are high in fiber. These include fresh fruits and vegetables, whole grains, and  beans.  Limit foods that are high in fat and processed sugars, such as fried and sweet foods. Exercise regularly. Exercise lowers stress and may help relieve pain. Ask your health care provider what activities and exercises are safe for you. If your health care provider approves, join an exercise class that combines movement and stress reduction. Examples include yoga and tai chi. Get enough sleep. Lack of sleep may make pain worse. Lower stress as much as possible. Practice stress reduction techniques as told by your therapist. General instructions Work with all your pain management providers to find the treatments that work best for you. You are an important member of your pain management team. There are many things you can do to reduce pain on your own. Consider joining an online or in-person support group for people who have chronic pain. Keep all follow-up visits. This is important. Where to find more information You can find more information about managing pain without opioids from: American Academy of Pain Medicine: painmed.org Institute for Chronic Pain: instituteforchronicpain.org American Chronic Pain Association: theacpa.org Contact a health care provider if: You have side effects from pain medicine. Your pain gets worse or does not get better with treatments or home therapy. You are struggling with anxiety or depression. Summary Many types of pain can be managed without opioids. Chronic pain may respond better to pain management without opioids. Pain is best managed when you and a team of health care providers work together. Pain management without opioids may include non-opioid medicines, medical treatments, physical therapy, mental health therapy, and lifestyle changes. Tell your health care providers if your pain gets worse or is not being managed well enough. This information is not intended to replace advice given to you by your health care provider. Make sure you discuss any  questions you have with your health care provider. Document Revised: 07/04/2020 Document Reviewed: 07/04/2020 Elsevier Patient Education  2024 [employer].

## 2023-10-30 ENCOUNTER — Ambulatory Visit (INDEPENDENT_AMBULATORY_CARE_PROVIDER_SITE_OTHER): Admitting: Internal Medicine

## 2023-10-30 ENCOUNTER — Encounter: Payer: Self-pay | Admitting: Internal Medicine

## 2023-10-30 VITALS — BP 118/76 | HR 114 | Ht 72.0 in | Wt 264.6 lb

## 2023-10-30 DIAGNOSIS — Z794 Long term (current) use of insulin: Secondary | ICD-10-CM

## 2023-10-30 DIAGNOSIS — E118 Type 2 diabetes mellitus with unspecified complications: Secondary | ICD-10-CM | POA: Diagnosis not present

## 2023-10-30 DIAGNOSIS — H6123 Impacted cerumen, bilateral: Secondary | ICD-10-CM | POA: Diagnosis not present

## 2023-10-30 DIAGNOSIS — C3491 Malignant neoplasm of unspecified part of right bronchus or lung: Secondary | ICD-10-CM

## 2023-10-30 DIAGNOSIS — I10 Essential (primary) hypertension: Secondary | ICD-10-CM

## 2023-10-30 LAB — POCT GLYCOSYLATED HEMOGLOBIN (HGB A1C): Hemoglobin A1C: 9 % — AB (ref 4.0–5.6)

## 2023-10-30 NOTE — Assessment & Plan Note (Addendum)
 Blood sugars have been stable.  No hypoglycemic events since last visit. Currently medications are Insulin  (Lantus ) and Ozempic  1 mg about every other week due to chemotherapy additive side effects. Last visit medical regimen changes were to reduce the Ozempic  dose. He thought he could not afford Ozempic  but went to the pharmacy and was dispensed at no cost then got a bill for $750. Will refer to Pharmacy to sort out the problem.   He will try to take the Ozempic  more regularly by spacing it around the Chemotherapy sessions. Lab Results  Component Value Date   HGBA1C 8.8 (H) 05/22/2023  A1C today = 9.0 Need to try to take ozempic  more regularly. Recheck in 3 months.

## 2023-10-30 NOTE — Progress Notes (Signed)
 Date:  10/30/2023   Name:  Jon Mendez   DOB:  1961/04/05   MRN:  [id number]   Chief Complaint: Hypertension and Diabetes  Hypertension This is a chronic problem. The problem is controlled. Pertinent negatives include no chest pain or shortness of breath. Past treatments include angiotensin blockers, diuretics and calcium channel blockers. The current treatment provides significant improvement. There are no compliance problems.  There is no history of kidney disease, CAD/MI or CVA.  Diabetes He presents for his follow-up diabetic visit. He has type 2 diabetes mellitus. Pertinent negatives for hypoglycemia include no nervousness/anxiousness. Pertinent negatives for diabetes include no chest pain and no fatigue. Pertinent negatives for diabetic complications include no CVA.    Review of Systems  Constitutional:  Negative for chills, fatigue and fever.  HENT:  Positive for hearing loss. Negative for ear pain.   Respiratory:  Negative for chest tightness and shortness of breath.   Cardiovascular:  Negative for chest pain.  Gastrointestinal:  Negative for abdominal pain, constipation and diarrhea.  Genitourinary:  Negative for hematuria and urgency.  Skin:  Positive for color change and rash.  Psychiatric/Behavioral:  Positive for sleep disturbance. Negative for dysphoric mood. The patient is not nervous/anxious.      Lab Results  Component Value Date   NA 140 09/10/2023   K 3.8 09/10/2023   CO2 23 (A) 09/10/2023   GLUCOSE 141 (H) 05/22/2023   BUN 10 09/10/2023   CREATININE 1.4 (A) 09/10/2023   CALCIUM 8.9 09/10/2023   EGFR 57 09/10/2023   GFRNONAA 33 (L) 03/05/2020   Lab Results  Component Value Date   CHOL 222 (H) 05/14/2022   HDL 46 05/14/2022   LDLCALC 137 (H) 05/14/2022   TRIG 220 (H) 05/14/2022   CHOLHDL 4.8 05/14/2022   Lab Results  Component Value Date   TSH 2.430 05/06/2021   Lab Results  Component Value Date   HGBA1C 8.8 (H) 05/22/2023   Lab Results   Component Value Date   WBC 11.8 09/10/2023   HGB 7.0 (A) 09/10/2023   HCT 21 (A) 09/10/2023   MCV 72 (L) 05/22/2023   PLT 313 09/10/2023   Lab Results  Component Value Date   ALT 14 09/10/2023   AST 18 09/10/2023   ALKPHOS 133 (A) 09/10/2023   BILITOT 0.2 05/22/2023   No results found for: MARIEN BOLLS, VD25OH   Patient Active Problem List   Diagnosis Date Noted   Antineoplastic chemotherapy induced pancytopenia (HCC) 09/10/2023   Other specified anemias 09/10/2023   Acquired absence of lung (part of) 09/24/2022   NSCLC of right lung (HCC) 06/17/2022   Adenocarcinoma of right lung (HCC) 05/14/2022   Rheumatoid arthritis, unspecified (HCC) 04/07/2022   History of colonic polyps    Polyp of transverse colon    Prostate cancer (HCC) 10/24/2021   Sleep-disordered breathing 09/04/2021   S/P ablation of atrial fibrillation 08/30/2021   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   Hip pain, chronic, right 10/24/2020   Acquired thrombophilia (HCC) 06/20/2020   Type II diabetes mellitus with complication (HCC) 09/19/2015   Tubular adenoma of colon    Stage 3b chronic kidney disease (HCC) 04/25/2015   Chronic gouty arthritis 11/22/2014   Hyperlipidemia associated with type 2 diabetes mellitus (HCC) 11/22/2014   Essential (primary) hypertension 11/22/2014   Compulsive tobacco user syndrome 11/22/2014   Varicose veins of both lower extremities 11/22/2014   Hidradenitis suppurativa 09/06/2012   Benign  paroxysmal positional nystagmus 07/29/2004    Allergies  Allergen Reactions   Egg-Derived Products Shortness Of Breath    Throat swells. Patient able to eat products that contain egg just not whole egg alone.   Fish Allergy Shortness Of Breath    Throat swells   Acyclovir And Related     Past Surgical History:  Procedure Laterality Date   CARDIAC ELECTROPHYSIOLOGY MAPPING AND ABLATION  07/2021   CARDIOVERSION N/A  06/17/2018   Procedure: CARDIOVERSION (CATH LAB);  Surgeon: Perla Evalene PARAS, MD;  Location: [facility];  Service: Cardiovascular;  Laterality: N/A;   COLONOSCOPY N/A 10/06/2023   Procedure: COLONOSCOPY;  Surgeon: Jinny Carmine, MD;  Location: [facility];  Service: Endoscopy;  Laterality: N/A;   COLONOSCOPY WITH PROPOFOL  N/A 06/04/2015   Procedure: COLONOSCOPY WITH PROPOFOL ;  Surgeon: Carmine Jinny, MD;  5 tubular adenomas   COLONOSCOPY WITH PROPOFOL  N/A 11/22/2021   Procedure: COLONOSCOPY WITH PROPOFOL ;  Surgeon: Jinny Carmine, MD;  Location: [facility];  Service: Endoscopy;  Laterality: N/A;  Diabetic   HIP ARTHROPLASTY Left 2013   LEG SKIN LESION  BIOPSY / EXCISION Right 2015   Hidradenitis   LUNG LOBECTOMY     POLYPECTOMY  06/04/2015   Procedure: POLYPECTOMY;  Surgeon: Carmine Jinny, MD;  Location: [facility];  Service: Endoscopy;;   POLYPECTOMY  11/22/2021   Procedure: POLYPECTOMY;  Surgeon: Jinny Carmine, MD;  Location: [facility];  Service: Endoscopy;;   POLYPECTOMY  10/06/2023   Procedure: POLYPECTOMY, INTESTINE;  Surgeon: Jinny Carmine, MD;  Location: [facility];  Service: Endoscopy;;   VARICOSE VEIN SURGERY Bilateral 2015   VIDEO ASSISTED THORACOSCOPY (VATS)/ LOBECTOMY Right 07/28/2022    Social History   Tobacco Use   Smoking status: Every Day    Current packs/day: 0.50    Average packs/day: 0.5 packs/day for 46.6 years (23.3 ttl pk-yrs)    Types: Cigarettes    Start date: 1979   Smokeless tobacco: Never   Tobacco comments:    10/28/23 smoking 7 cigarettes per day/pbt  Vaping Use   Vaping status: Never Used  Substance Use Topics   Alcohol use: No    Alcohol/week: 4.0 standard drinks of alcohol    Types: 4 Standard drinks or equivalent per week   Drug use: No     Medication list has been reviewed and updated.  Current Meds  Medication Sig   Adalimumab 40 MG/0.8ML PSKT HUMIRA   allopurinol  (ZYLOPRIM ) 100 MG tablet Take 1 tablet (100 mg  total) by mouth daily.   amiodarone  (PACERONE ) 200 MG tablet Take 1 tablet (200 mg total) by mouth daily.   bimekizumab-bkzx (BIMZELX) 160 MG/ML pen Inject 320 mg into the skin.   clindamycin  (CLEOCIN  T) 1 % lotion Apply topically 2 (two) times daily as needed.   clindamycin  (CLEOCIN ) 150 MG capsule Take 300 mg by mouth 4 (four) times daily.   clindamycin  (CLEOCIN ) 300 MG capsule Take 300 mg by mouth 2 (two) times daily. (Patient taking differently: Take 300 mg by mouth 2 (two) times daily. Hasn't started yet, will start after taking 150mg  2 tablet 4 times per day)   dexamethasone  (DECADRON ) 4 MG tablet Take 8 mg by mouth.   diltiazem  (CARDIZEM  CD) 240 MG 24 hr capsule Take 240 mg by mouth daily.   diphenhydrAMINE (BENADRYL) 12.5 MG/5ML liquid Mix equal parts diphenhydramine, lidocaine , and liquid antacid. Swish 5 to 10mLs in mouth for 60 seconds, every 4 to 6 hours as needed, then swallow or spit mixture  out (as directed by prescriber). Shake well before using. Refrigerate after mixing and discard after 14 days.   folic acid (FOLVITE) 1 MG tablet Take 1 mg by mouth daily.   glucose blood (FREESTYLE LITE) test strip Use as instructed   HYDROcodone -Acetaminophen  10-300 MG TABS Take 1 tablet by mouth every 4 (four) hours as needed (pain).   hydroxychloroquine (PLAQUENIL) 200 MG tablet Take 200 mg by mouth daily.   insulin  glargine (LANTUS  SOLOSTAR) 100 UNIT/ML Solostar Pen INJECT 10-50 UNITS INTO THE SKIN DAILY. (Patient taking differently: 15 units daily)   Insulin  Pen Needle (PEN NEEDLES) 31G X 6 MM MISC 1 each by Does not apply route daily at 6 (six) AM.   Lancets (FREESTYLE) lancets Use as instructed   losartan -hydrochlorothiazide  (HYZAAR) 100-25 MG tablet Take 1 tablet by mouth daily.   metoprolol  tartrate (LOPRESSOR ) 100 MG tablet Take 100 mg by mouth 2 (two) times daily. (Patient taking differently: Take 100 mg by mouth 2 (two) times daily. PRN)   Multiple Vitamin (MULTIVITAMIN WITH MINERALS)  TABS tablet Take 1 tablet by mouth daily. Complete Multivitamin   OLANZapine (ZYPREXA) 5 MG tablet Take 5 mg by mouth.   ondansetron  (ZOFRAN ) 8 MG tablet Take 8 mg by mouth every 8 (eight) hours as needed.   pravastatin  (PRAVACHOL ) 40 MG tablet Take 1 tablet (40 mg total) by mouth daily.   rivaroxaban  (XARELTO ) 20 MG TABS tablet Take 1 tablet by mouth daily with breakfast.   SEMAGLUTIDE , 1 MG/DOSE, [place] Inject 1 mg into the skin.   tamsulosin (FLOMAX) 0.4 MG CAPS capsule Take 1 tablet by mouth daily as needed.       10/30/2023    3:11 PM 06/16/2023    3:53 PM 05/22/2023    4:00 PM 11/17/2022    3:29 PM  GAD 7 : Generalized Anxiety Score  Nervous, Anxious, on Edge 0 0 0 0  Control/stop worrying 0 0 0 0  Worry too much - different things 0 0 0 0  Trouble relaxing 0 0 0 0  Restless 0 0 0 0  Easily annoyed or irritable 0 0 0 0  Afraid - awful might happen 0 0 0 0  Total GAD 7 Score 0 0 0 0  Anxiety Difficulty Not difficult at all Not difficult at all Not difficult at all Not difficult at all       10/30/2023    3:11 PM 10/28/2023    4:22 PM 06/16/2023    3:53 PM  Depression screen PHQ 2/9  Decreased Interest 0 0 0  Down, Depressed, Hopeless 0 0 0  PHQ - 2 Score 0 0 0  Altered sleeping 0 0 1  Tired, decreased energy 2 2 1   Change in appetite 0 0 0  Feeling bad or failure about yourself  0 0 0  Trouble concentrating 0 0 0  Moving slowly or fidgety/restless 0 0 0  Suicidal thoughts 0 0 0  PHQ-9 Score 2 2 2   Difficult doing work/chores Not difficult at all Somewhat difficult Not difficult at all    BP Readings from Last 3 Encounters:  10/30/23 118/76  10/06/23 138/80  06/16/23 118/68    Physical Exam Vitals and nursing note reviewed.  Constitutional:      General: He is not in acute distress.    Appearance: He is well-developed. He is obese.  HENT:     Head: Normocephalic and atraumatic.     Right Ear: There is impacted cerumen.  Left Ear: There is impacted cerumen.   Cardiovascular:     Rate and Rhythm: Normal rate and regular rhythm.  Pulmonary:     Effort: Pulmonary effort is normal. No respiratory distress.     Breath sounds: No wheezing or rhonchi.  Musculoskeletal:     Cervical back: Normal range of motion.     Right lower leg: No edema.     Left lower leg: No edema.  Lymphadenopathy:     Cervical: No cervical adenopathy.  Skin:    General: Skin is warm and dry.     Findings: No rash.  Neurological:     Mental Status: He is alert and oriented to person, place, and time.  Psychiatric:        Mood and Affect: Mood normal.        Behavior: Behavior normal.     Wt Readings from Last 3 Encounters:  10/30/23 264 lb 9.6 oz (120 kg)  10/28/23 265 lb (120.2 kg)  10/06/23 265 lb (120.2 kg)    BP 118/76   Pulse (!) 114   Ht 6' (1.829 m)   Wt 264 lb 9.6 oz (120 kg)   SpO2 96%   BMI 35.89 kg/m   Assessment and Plan:  Problem List Items Addressed This Visit       Unprioritized   Adenocarcinoma of right lung (HCC) (Chronic)   Per Oncology note 10/2023: # Metastatic NSCLC - adenocarcinoma, KRAS G12V/KEAP1 Elevated WBC suggests possible infection; chemotherapy deferred to avoid immunosuppression. - Defer chemotherapy for three weeks. (Started platinum doublet chemotherapy. Imaging reviewed and demonstrates treatment response. Will plan to continue carbo/pem after blood transfusion ) - Monitor white blood cell count. - Coordinate with dermatology for urgent evaluation of hidradenitis suppurativa. - Anti-emetic regimen with dexamethasone , olanzapine, Zofran /Compazine prn - B12 shot + folic acid - reminded to take regularly given anemia       Essential (primary) hypertension - Primary (Chronic)   Blood pressure is well controlled on losartan  hct, cardiazem and PRN metoprolol . No medication side effects noted. Plan to continue current medications.       Type II diabetes mellitus with complication (HCC) (Chronic)   Blood sugars have  been stable.  No hypoglycemic events since last visit. Currently medications are Insulin  (Lantus ) and Ozempic  1 mg about every other week due to chemotherapy additive side effects. Last visit medical regimen changes were to reduce the Ozempic  dose. He thought he could not afford Ozempic  but went to the pharmacy and was dispensed at no cost then got a bill for $750. Will refer to Pharmacy to sort out the problem.   He will try to take the Ozempic  more regularly by spacing it around the Chemotherapy sessions. Lab Results  Component Value Date   HGBA1C 8.8 (H) 05/22/2023  A1C today = 9.0 Need to try to take ozempic  more regularly. Recheck in 3 months.       Relevant Orders   POCT glycosylated hemoglobin (Hb A1C)   AMB Referral VBCI Care Management   Other Visit Diagnoses       Long-term insulin  use (HCC)         Bilateral hearing loss due to cerumen impaction       Relevant Orders   Ambulatory referral to ENT       Return in about 15 weeks (around 02/12/2024) for DM, HTN.    Leita HILARIO Adie, MD [facility] Medicine [place]

## 2023-10-30 NOTE — Assessment & Plan Note (Addendum)
 Per Oncology note 10/2023: # Metastatic NSCLC - adenocarcinoma, KRAS G12V/KEAP1 Elevated WBC suggests possible infection; chemotherapy deferred to avoid immunosuppression. - Defer chemotherapy for three weeks. (Started platinum doublet chemotherapy. Imaging reviewed and demonstrates treatment response. Will plan to continue carbo/pem after blood transfusion ) - Monitor white blood cell count. - Coordinate with dermatology for urgent evaluation of hidradenitis suppurativa. - Anti-emetic regimen with dexamethasone , olanzapine, Zofran /Compazine prn - B12 shot + folic acid - reminded to take regularly given anemia

## 2023-10-30 NOTE — Assessment & Plan Note (Signed)
 Blood pressure is well controlled on losartan  hct, cardiazem and PRN metoprolol . No medication side effects noted. Plan to continue current medications.

## 2023-11-02 ENCOUNTER — Telehealth: Payer: Self-pay | Admitting: Pharmacist

## 2023-11-02 ENCOUNTER — Telehealth: Payer: Self-pay

## 2023-11-02 NOTE — Progress Notes (Unsigned)
 Care Guide Pharmacy Note  11/02/2023 Name: Jon Mendez MRN: [id number] DOB: 01/25/1961  Referred By: Justus Leita DEL, MD Reason for referral: No chief complaint on file.   Jon Mendez is a 63 y.o. year old male who is a primary care patient of Justus Leita DEL, MD.  Jon Mendez was referred to the pharmacist for assistance related to: DMII  An unsuccessful telephone outreach was attempted today to contact the patient who was referred to the pharmacy team for assistance with medication assistance. Additional attempts will be made to contact the patient.  Jon Mendez , RMA     [facility]  [facility] [place] [facility], [facility] Guide  Direct Dial: [phone]  Website: [email or web address]

## 2023-11-02 NOTE — Progress Notes (Signed)
   Outreach Note  11/02/2023 Name: Jon Mendez MRN: [id number] DOB: [birth year]  Referred by: Justus Leita DEL, MD Reason for referral : Medication Assistance  Receive referral from PCP requesting outreach to patient to discuss medication access related to Ozempic .  Note previously spoke with patient on 07/17/2023 related to diabetes control and medication management. At that time, recommended patient contact [facility] about completing application for patient assistance for Ozempic  from manufacturer Novo Nordisk  - Note unable to reach patient after initial discussion/he declined to schedule further follow up   Was unable to reach patient via telephone today and have left HIPAA compliant voicemail asking patient to return my call.    Follow Up Plan: Will collaborate with Care Guide to outreach to schedule follow up with me  Sharyle Sia, PharmD, [facility] [phone]

## 2023-11-04 NOTE — Progress Notes (Unsigned)
 Care Guide Pharmacy Note  11/04/2023 Name: Jon Mendez MRN: [id number] DOB: 1960/08/04  Referred By: Justus Leita DEL, MD Reason for referral: Complex Care Management (Outreach to schedule with Pharm d )   Jon Mendez is a 63 y.o. year old male who is a primary care patient of Justus Leita DEL, MD.  Jon Mendez was referred to the pharmacist for assistance related to: DMII  A second unsuccessful telephone outreach was attempted today to contact the patient who was referred to the pharmacy team for assistance with medication assistance. Additional attempts will be made to contact the patient.  Jon Mendez , RMA     [facility]  [facility], [facility] Guide  Direct Dial: [phone]  Website: [email or web address]

## 2023-11-05 NOTE — Progress Notes (Signed)
 Care Guide Pharmacy Note  11/05/2023 Name: [name] MRN: [id number] DOB: 09/21/1960  Referred By: Justus Leita DEL, MD Reason for referral: Complex Care Management (Outreach to schedule with Pharm d )   [name] is a 63 y.o. year old male who is a primary care patient of Justus Leita DEL, MD.  [name] was referred to the pharmacist for assistance related to: DMII  Successful contact was made with the patient to discuss pharmacy services including being ready for the pharmacist to call at least 5 minutes before the scheduled appointment time and to have medication bottles and any blood pressure readings ready for review. The patient agreed to meet with the pharmacist via telephone visit on (date/time).11/06/2023  Jeoffrey Buffalo , RMA     [place]  [facility], [facility] Guide  Direct Dial: [phone]  Website: [email or web address]

## 2023-11-06 ENCOUNTER — Other Ambulatory Visit: Payer: Self-pay | Admitting: Internal Medicine

## 2023-11-06 ENCOUNTER — Other Ambulatory Visit: Payer: Self-pay | Admitting: Pharmacist

## 2023-11-06 ENCOUNTER — Other Ambulatory Visit: Payer: Self-pay

## 2023-11-06 DIAGNOSIS — I10 Essential (primary) hypertension: Secondary | ICD-10-CM

## 2023-11-06 DIAGNOSIS — E118 Type 2 diabetes mellitus with unspecified complications: Secondary | ICD-10-CM

## 2023-11-06 NOTE — Telephone Encounter (Signed)
 Requested Prescriptions  Pending Prescriptions Disp Refills   losartan -hydrochlorothiazide  (HYZAAR) 100-25 MG tablet [Pharmacy Med Name: LOSARTAN -HCTZ 100-25 MG TAB] 90 tablet 0    Sig: TAKE 1 TABLET BY MOUTH EVERY DAY     Cardiovascular: ARB + Diuretic Combos Failed - 11/06/2023  2:24 PM      Failed - Cr in normal range and within 180 days    Creatinine  Date Value Ref Range Status  09/10/2023 1.4 (A) 0.6 - 1.3 Final  05/09/2012 1.50 (H) 0.60 - 1.30 mg/dL Final   Creatinine, Ser  Date Value Ref Range Status  05/22/2023 1.79 (H) 0.76 - 1.27 mg/dL Final         Passed - K in normal range and within 180 days    Potassium  Date Value Ref Range Status  09/10/2023 3.8 3.5 - 5.1 mEq/L Final  05/09/2012 4.1 3.5 - 5.1 mmol/L Final         Passed - Na in normal range and within 180 days    Sodium  Date Value Ref Range Status  09/10/2023 140 137 - 147 Final  05/09/2012 135 (L) 136 - 145 mmol/L Final         Passed - eGFR is 10 or above and within 180 days    EGFR (African American)  Date Value Ref Range Status  05/09/2012 >60  Final   GFR calc Af Amer  Date Value Ref Range Status  03/05/2020 39 (L) >59 mL/min/1.73 Final    Comment:    **In accordance with recommendations from the NKF-ASN Task force,**   [facility] is in the process of updating its eGFR calculation to the   2021 CKD-EPI creatinine equation that estimates kidney function   without a race variable.    EGFR (Non-African Amer.)  Date Value Ref Range Status  05/09/2012 53 (L)  Final    Comment:    eGFR values <60mL/min/1.73 m2 may be an indication of chronic kidney disease (CKD). Calculated eGFR is useful in patients with stable renal function. The eGFR calculation will not be reliable in acutely ill patients when serum creatinine is changing rapidly. It is not useful in  patients on dialysis. The eGFR calculation may not be applicable to patients at the low and high extremes of body sizes, pregnant women, and  vegetarians.    GFR calc non Af Amer  Date Value Ref Range Status  03/05/2020 33 (L) >59 mL/min/1.73 Final   eGFR  Date Value Ref Range Status  09/10/2023 57  Final  05/22/2023 42 (L) >59 mL/min/1.73 Final         Passed - Patient is not pregnant      Passed - Last BP in normal range    BP Readings from Last 1 Encounters:  10/30/23 118/76         Passed - Valid encounter within last 6 months    Recent Outpatient Visits           1 week ago Essential (primary) hypertension   [place] [facility] at [facility], Leita DEL, MD   4 months ago Cellulitis and abscess of foot   [facility] at [facility], Leita DEL, MD   5 months ago Essential (primary) hypertension   [place] [facility] at [facility], Leita DEL, MD       Future Appointments  In 3 months Justus, Leita DEL, MD [facility] Medicine at [facility], [facility]

## 2023-11-06 NOTE — Patient Instructions (Signed)
 Please follow up with Herchel Ferries at [facility] about completing application for patient assistance for Ozempic  from manufacturer Novo Nordisk.   [facility] [address] [place], [place] [address] [phone]   Thank you!   Sharyle Sia, PharmD, [facility] [phone]

## 2023-11-06 NOTE — Progress Notes (Signed)
 11/06/2023 Name: Jon Mendez MRN: [id number] DOB: [birth year]  Chief Complaint  Patient presents with   Medication Assistance    Jon Mendez is a 63 y.o. year old male who presented for a telephone visit.   Note previously spoke with patient on 07/17/2023 related to diabetes control and medication management. At that time, recommended patient contact [facility] about completing application for patient assistance for Ozempic  from manufacturer Novo Nordisk  - Note unable to reach patient after initial discussion/he declined to schedule further follow up  Outreached patient as referred by PCP to discuss medication access related to Ozempic    Subjective:   Care Team: Primary Care Provider: Justus Leita DEL, MD ; Next Scheduled Visit: 02/12/2024 Oncologist: [facility] Cardiologist: Florencio Cara Endow, MD  Medication Access/Adherence  [phone] [address], [place] - [address] [place] [address] Phone: [phone] Fax: [phone]  [facility] [address] [place] [address] Phone: [phone] Fax: [phone]   Patient reports affordability concerns with their medications: Yes  Patient reports access/transportation concerns to their pharmacy: No  Patient reports adherence concerns with their medications:  No     Reports has >1 month supply of Ozempic  remaining, but that the cost of Ozempic  is unaffordable through his [facility] plan - Advises that he went to [facility] and cost at pharmacy was $0, but he was then billed >$700 by his [employer] plan   Note patient reports already engaging with [facility] for assistance with patient assistance for medications prescribed by [facility] providers  Diabetes:   Current medications:  - Ozempic  1 mg once weekly   Medications tried in the  past: metformin  (stopped related to kidneys); Ozempic  (cost); Lantus     Current glucose readings: recalls recent morning fasting readings ranging 110-120s     Patient denies hypoglycemic s/sx including dizziness, shakiness, sweating.      Current medication access support: none      Objective:  Lab Results  Component Value Date   HGBA1C 9.0 (A) 10/30/2023    Lab Results  Component Value Date   CREATININE 1.4 (A) 09/10/2023   BUN 10 09/10/2023   NA 140 09/10/2023   K 3.8 09/10/2023   CL 106 09/10/2023   CO2 23 (A) 09/10/2023    Lab Results  Component Value Date   CHOL 222 (H) 05/14/2022   HDL 46 05/14/2022   LDLCALC 137 (H) 05/14/2022   TRIG 220 (H) 05/14/2022   CHOLHDL 4.8 05/14/2022    Current Outpatient Medications on File Prior to Visit  Medication Sig Dispense Refill   SEMAGLUTIDE , 1 MG/DOSE, [place] Inject 1 mg into the skin.     Adalimumab 40 MG/0.8ML PSKT HUMIRA     allopurinol  (ZYLOPRIM ) 100 MG tablet Take 1 tablet (100 mg total) by mouth daily. 90 tablet 1   amiodarone  (PACERONE ) 200 MG tablet Take 1 tablet (200 mg total) by mouth daily. 90 tablet 1   bimekizumab-bkzx (BIMZELX) 160 MG/ML pen Inject 320 mg into the skin.     clindamycin  (CLEOCIN  T) 1 % lotion Apply topically 2 (two) times daily as needed.     clindamycin  (CLEOCIN ) 150 MG capsule Take 300 mg by mouth 4 (four) times daily.     clindamycin  (CLEOCIN ) 300 MG capsule Take 300 mg by mouth 2 (two) times daily. (Patient taking differently: Take 300  mg by mouth 2 (two) times daily. Hasn't started yet, will start after taking 150mg  2 tablet 4 times per day)     dexamethasone  (DECADRON ) 4 MG tablet Take 8 mg by mouth.     diltiazem  (CARDIZEM  CD) 240 MG 24 hr capsule Take 240 mg by mouth daily.     diphenhydrAMINE (BENADRYL) 12.5 MG/5ML liquid Mix equal parts diphenhydramine, lidocaine , and liquid antacid. Swish 5 to 10mLs in mouth for 60 seconds, every 4 to 6 hours as needed, then swallow or spit mixture out  (as directed by prescriber). Shake well before using. Refrigerate after mixing and discard after 14 days.     folic acid (FOLVITE) 1 MG tablet Take 1 mg by mouth daily.     glucose blood (FREESTYLE LITE) test strip Use as instructed 100 each 12   HYDROcodone -Acetaminophen  10-300 MG TABS Take 1 tablet by mouth every 4 (four) hours as needed (pain).     hydroxychloroquine (PLAQUENIL) 200 MG tablet Take 200 mg by mouth daily.     insulin  glargine (LANTUS  SOLOSTAR) 100 UNIT/ML Solostar Pen INJECT 10-50 UNITS INTO THE SKIN DAILY. (Patient not taking: Reported on 11/06/2023) 45 mL 1   Insulin  Pen Needle (PEN NEEDLES) 31G X 6 MM MISC 1 each by Does not apply route daily at 6 (six) AM. 100 each 0   Lancets (FREESTYLE) lancets Use as instructed 100 each 12   losartan -hydrochlorothiazide  (HYZAAR) 100-25 MG tablet Take 1 tablet by mouth daily. 90 tablet 1   metoprolol  tartrate (LOPRESSOR ) 100 MG tablet Take 100 mg by mouth 2 (two) times daily. (Patient taking differently: Take 100 mg by mouth 2 (two) times daily. PRN)     Multiple Vitamin (MULTIVITAMIN WITH MINERALS) TABS tablet Take 1 tablet by mouth daily. Complete Multivitamin     OLANZapine (ZYPREXA) 5 MG tablet Take 5 mg by mouth.     ondansetron  (ZOFRAN ) 8 MG tablet Take 8 mg by mouth every 8 (eight) hours as needed.     pravastatin  (PRAVACHOL ) 40 MG tablet Take 1 tablet (40 mg total) by mouth daily. 90 tablet 1   rivaroxaban  (XARELTO ) 20 MG TABS tablet Take 1 tablet by mouth daily with breakfast.     tamsulosin (FLOMAX) 0.4 MG CAPS capsule Take 1 tablet by mouth daily as needed.     No current facility-administered medications on file prior to visit.     Assessment/Plan:   Based on [employer], note patient does not have a prescription deductible for 2025, but copay for tier 3 medications (such as Ozempic ) is 25% coinsurance  Patient plans to follow up with [facility] regarding current bill to determine why not billed at pharmacy,  rather billed afterwards. Will check to see if enrolled in Medicare Prescription Payment plan  Diabetes: - Currently uncontrolled - Reviewed long term cardiovascular and renal outcomes of uncontrolled blood sugar - Reviewed goal A1c, goal fasting, and goal 2 hour post prandial glucose - Reviewed dietary modifications including importance of having regular well-balanced meals and snacks throughout the day, while controlling carbohydrate portion sizes             Encourage patient to avoid consumption of sugary beverages - Patient has denied personal or family history of multiple endocrine neoplasia type 2, medullary thyroid cancer; personal history of pancreatitis or gallbladder disease. - Recommend to check glucose, keep log of results and have this record to review at upcoming medical appointments. Patient to contact provider office sooner if needed for readings outside  of established parameters or symptoms - Meets financial criteria for Ozempic  patient assistance program through [employer].   Note PCP office does not currently accept shipments from patient assistance programs Will collaborate with [facility] to ask pharmacy to transfer latest Ozem[facility] and assist patient to pursue assistance.  - Advise patient to follow up with [facility]  Follow Up Plan: Clinical Pharmacist will follow up with patient by telephone on 12/02/2023 at 3:00 PM     Sharyle Sia, PharmD, [facility] [phone]

## 2023-11-09 ENCOUNTER — Other Ambulatory Visit: Payer: Self-pay

## 2023-11-22 DIAGNOSIS — N179 Acute kidney failure, unspecified: Secondary | ICD-10-CM | POA: Diagnosis not present

## 2023-11-22 DIAGNOSIS — N189 Chronic kidney disease, unspecified: Secondary | ICD-10-CM | POA: Diagnosis not present

## 2023-11-22 DIAGNOSIS — C3490 Malignant neoplasm of unspecified part of unspecified bronchus or lung: Secondary | ICD-10-CM | POA: Diagnosis not present

## 2023-11-22 DIAGNOSIS — D709 Neutropenia, unspecified: Secondary | ICD-10-CM | POA: Diagnosis not present

## 2023-11-23 ENCOUNTER — Other Ambulatory Visit: Payer: Self-pay | Admitting: Internal Medicine

## 2023-11-23 DIAGNOSIS — N179 Acute kidney failure, unspecified: Secondary | ICD-10-CM | POA: Diagnosis not present

## 2023-11-23 DIAGNOSIS — C3490 Malignant neoplasm of unspecified part of unspecified bronchus or lung: Secondary | ICD-10-CM | POA: Diagnosis not present

## 2023-11-23 DIAGNOSIS — N189 Chronic kidney disease, unspecified: Secondary | ICD-10-CM | POA: Diagnosis not present

## 2023-11-23 DIAGNOSIS — D709 Neutropenia, unspecified: Secondary | ICD-10-CM | POA: Diagnosis not present

## 2023-11-24 DIAGNOSIS — L732 Hidradenitis suppurativa: Secondary | ICD-10-CM | POA: Diagnosis not present

## 2023-11-24 DIAGNOSIS — N183 Chronic kidney disease, stage 3 unspecified: Secondary | ICD-10-CM | POA: Diagnosis not present

## 2023-11-24 DIAGNOSIS — N179 Acute kidney failure, unspecified: Secondary | ICD-10-CM | POA: Diagnosis not present

## 2023-11-24 DIAGNOSIS — E870 Hyperosmolality and hypernatremia: Secondary | ICD-10-CM | POA: Diagnosis not present

## 2023-11-24 NOTE — Telephone Encounter (Signed)
 Requested Prescriptions  Refused Prescriptions Disp Refills   metFORMIN  (GLUCOPHAGE -XR) 500 MG 24 hr tablet [Pharmacy Med Name: METFORMIN  HCL ER 500 MG TABLET] 90 tablet 1    Sig: TAKE 1 TABLET BY MOUTH EVERY DAY WITH BREAKFAST     Endocrinology:  Diabetes - Biguanides Failed - 11/24/2023  5:47 PM      Failed - Cr in normal range and within 360 days    Creatinine  Date Value Ref Range Status  09/10/2023 1.4 (A) 0.6 - 1.3 Final  05/09/2012 1.50 (H) 0.60 - 1.30 mg/dL Final   Creatinine, Ser  Date Value Ref Range Status  05/22/2023 1.79 (H) 0.76 - 1.27 mg/dL Final         Failed - HBA1C is between 0 and 7.9 and within 180 days    Hemoglobin A1C  Date Value Ref Range Status  10/30/2023 9.0 (A) 4.0 - 5.6 % Final  10/01/2020 8.2  Final   Hgb A1c MFr Bld  Date Value Ref Range Status  05/22/2023 8.8 (H) 4.8 - 5.6 % Final    Comment:             Prediabetes: 5.7 - 6.4          Diabetes: >6.4          Glycemic control for adults with diabetes: <7.0          Failed - B12 Level in normal range and within 720 days    No results found for: VITAMINB12       Passed - eGFR in normal range and within 360 days    EGFR (African American)  Date Value Ref Range Status  05/09/2012 >60  Final   GFR calc Af Amer  Date Value Ref Range Status  03/05/2020 39 (L) >59 mL/min/1.73 Final    Comment:    **In accordance with recommendations from the NKF-ASN Task force,**   [facility] is in the process of updating its eGFR calculation to the   2021 CKD-EPI creatinine equation that estimates kidney function   without a race variable.    EGFR (Non-African Amer.)  Date Value Ref Range Status  05/09/2012 53 (L)  Final    Comment:    eGFR values <34mL/min/1.73 m2 may be an indication of chronic kidney disease (CKD). Calculated eGFR is useful in patients with stable renal function. The eGFR calculation will not be reliable in acutely ill patients when serum creatinine is changing rapidly. It is not  useful in  patients on dialysis. The eGFR calculation may not be applicable to patients at the low and high extremes of body sizes, pregnant women, and vegetarians.    GFR calc non Af Amer  Date Value Ref Range Status  03/05/2020 33 (L) >59 mL/min/1.73 Final   eGFR  Date Value Ref Range Status  09/10/2023 57  Final  05/22/2023 42 (L) >59 mL/min/1.73 Final         Passed - Valid encounter within last 6 months    Recent Outpatient Visits           3 weeks ago Essential (primary) hypertension   [facility], Leita DEL, MD   5 months ago Cellulitis and abscess of foot   [facility] at [facility], Leita DEL, MD   6 months ago Essential (primary) hypertension   [facility] at [facility], Leita DEL, MD  Future Appointments             In 2 months Justus, Leita DEL, MD [facility] Primary Care & Sports Medicine at [facility]            Passed - CBC within normal limits and completed in the last 12 months    WBC  Date Value Ref Range Status  09/10/2023 11.8  Final  05/06/2018 13.9 (H) 4.0 - 10.5 K/uL Final   RBC  Date Value Ref Range Status  05/22/2023 4.64 4.14 - 5.80 x10E6/uL Final  05/06/2018 5.55 4.22 - 5.81 MIL/uL Final   Hemoglobin  Date Value Ref Range Status  09/10/2023 7.0 (A) 13.5 - 17.5 Final  05/22/2023 10.7 (L) 13.0 - 17.7 g/dL Final   HCT  Date Value Ref Range Status  09/10/2023 21 (A) 41 - 53 Final   Hematocrit  Date Value Ref Range Status  05/22/2023 33.3 (L) 37.5 - 51.0 % Final   MCHC  Date Value Ref Range Status  05/22/2023 32.1 31.5 - 35.7 g/dL Final  98/69/7979 67.6 30.0 - 36.0 g/dL Final   [facility]  Date Value Ref Range Status  05/22/2023 23.1 (L) 26.6 - 33.0 pg Final  05/06/2018 25.4 (L) 26.0 - 34.0 pg Final   MCV  Date Value Ref Range Status  05/22/2023 72 (L) 79 - 97 fL Final   05/09/2012 78 (L) 80 - 100 fL Final   No results found for: PLTCOUNTKUC, LABPLAT, POCPLA RDW  Date Value Ref Range Status  05/22/2023 16.0 (H) 11.6 - 15.4 % Final  05/09/2012 15.4 (H) 11.5 - 14.5 % Final

## 2023-11-25 ENCOUNTER — Encounter: Payer: Self-pay | Admitting: Internal Medicine

## 2023-11-25 DIAGNOSIS — N189 Chronic kidney disease, unspecified: Secondary | ICD-10-CM | POA: Diagnosis not present

## 2023-11-25 DIAGNOSIS — N179 Acute kidney failure, unspecified: Secondary | ICD-10-CM | POA: Diagnosis not present

## 2023-11-25 DIAGNOSIS — N170 Acute kidney failure with tubular necrosis: Secondary | ICD-10-CM | POA: Diagnosis not present

## 2023-11-25 DIAGNOSIS — I120 Hypertensive chronic kidney disease with stage 5 chronic kidney disease or end stage renal disease: Secondary | ICD-10-CM | POA: Diagnosis not present

## 2023-11-25 DIAGNOSIS — D649 Anemia, unspecified: Secondary | ICD-10-CM | POA: Diagnosis not present

## 2023-11-25 DIAGNOSIS — N1831 Chronic kidney disease, stage 3a: Secondary | ICD-10-CM | POA: Diagnosis not present

## 2023-11-25 DIAGNOSIS — E870 Hyperosmolality and hypernatremia: Secondary | ICD-10-CM | POA: Diagnosis not present

## 2023-11-26 DIAGNOSIS — N179 Acute kidney failure, unspecified: Secondary | ICD-10-CM | POA: Diagnosis not present

## 2023-11-26 DIAGNOSIS — N170 Acute kidney failure with tubular necrosis: Secondary | ICD-10-CM | POA: Diagnosis not present

## 2023-11-26 DIAGNOSIS — N1831 Chronic kidney disease, stage 3a: Secondary | ICD-10-CM | POA: Diagnosis not present

## 2023-11-26 DIAGNOSIS — E870 Hyperosmolality and hypernatremia: Secondary | ICD-10-CM | POA: Diagnosis not present

## 2023-11-26 DIAGNOSIS — E872 Acidosis, unspecified: Secondary | ICD-10-CM | POA: Diagnosis not present

## 2023-11-26 DIAGNOSIS — D649 Anemia, unspecified: Secondary | ICD-10-CM | POA: Diagnosis not present

## 2023-11-26 DIAGNOSIS — N189 Chronic kidney disease, unspecified: Secondary | ICD-10-CM | POA: Diagnosis not present

## 2023-11-26 DIAGNOSIS — I129 Hypertensive chronic kidney disease with stage 1 through stage 4 chronic kidney disease, or unspecified chronic kidney disease: Secondary | ICD-10-CM | POA: Diagnosis not present

## 2023-11-27 DIAGNOSIS — L732 Hidradenitis suppurativa: Secondary | ICD-10-CM | POA: Diagnosis not present

## 2023-11-27 DIAGNOSIS — D649 Anemia, unspecified: Secondary | ICD-10-CM | POA: Diagnosis not present

## 2023-11-27 DIAGNOSIS — N179 Acute kidney failure, unspecified: Secondary | ICD-10-CM | POA: Diagnosis not present

## 2023-11-27 DIAGNOSIS — Z452 Encounter for adjustment and management of vascular access device: Secondary | ICD-10-CM | POA: Diagnosis not present

## 2023-11-27 DIAGNOSIS — R638 Other symptoms and signs concerning food and fluid intake: Secondary | ICD-10-CM | POA: Diagnosis not present

## 2023-11-27 DIAGNOSIS — N189 Chronic kidney disease, unspecified: Secondary | ICD-10-CM | POA: Diagnosis not present

## 2023-11-28 DIAGNOSIS — D709 Neutropenia, unspecified: Secondary | ICD-10-CM | POA: Diagnosis not present

## 2023-11-28 DIAGNOSIS — N189 Chronic kidney disease, unspecified: Secondary | ICD-10-CM | POA: Diagnosis not present

## 2023-11-28 DIAGNOSIS — Z4682 Encounter for fitting and adjustment of non-vascular catheter: Secondary | ICD-10-CM | POA: Diagnosis not present

## 2023-11-28 DIAGNOSIS — J9601 Acute respiratory failure with hypoxia: Secondary | ICD-10-CM | POA: Diagnosis not present

## 2023-11-28 DIAGNOSIS — R918 Other nonspecific abnormal finding of lung field: Secondary | ICD-10-CM | POA: Diagnosis not present

## 2023-11-28 DIAGNOSIS — Z9911 Dependence on respirator [ventilator] status: Secondary | ICD-10-CM | POA: Diagnosis not present

## 2023-11-28 DIAGNOSIS — R5081 Fever presenting with conditions classified elsewhere: Secondary | ICD-10-CM | POA: Diagnosis not present

## 2023-11-28 DIAGNOSIS — N179 Acute kidney failure, unspecified: Secondary | ICD-10-CM | POA: Diagnosis not present

## 2023-11-28 DIAGNOSIS — Z9889 Other specified postprocedural states: Secondary | ICD-10-CM | POA: Diagnosis not present

## 2023-11-28 DIAGNOSIS — N170 Acute kidney failure with tubular necrosis: Secondary | ICD-10-CM | POA: Diagnosis not present

## 2023-11-28 DIAGNOSIS — R0600 Dyspnea, unspecified: Secondary | ICD-10-CM | POA: Diagnosis not present

## 2023-11-28 DIAGNOSIS — Z452 Encounter for adjustment and management of vascular access device: Secondary | ICD-10-CM | POA: Diagnosis not present

## 2023-11-28 DIAGNOSIS — R578 Other shock: Secondary | ICD-10-CM | POA: Diagnosis not present

## 2023-11-29 DIAGNOSIS — N189 Chronic kidney disease, unspecified: Secondary | ICD-10-CM | POA: Diagnosis not present

## 2023-11-29 DIAGNOSIS — K626 Ulcer of anus and rectum: Secondary | ICD-10-CM | POA: Diagnosis not present

## 2023-11-29 DIAGNOSIS — D709 Neutropenia, unspecified: Secondary | ICD-10-CM | POA: Diagnosis not present

## 2023-11-29 DIAGNOSIS — J9601 Acute respiratory failure with hypoxia: Secondary | ICD-10-CM | POA: Diagnosis not present

## 2023-11-29 DIAGNOSIS — N281 Cyst of kidney, acquired: Secondary | ICD-10-CM | POA: Diagnosis not present

## 2023-11-29 DIAGNOSIS — K922 Gastrointestinal hemorrhage, unspecified: Secondary | ICD-10-CM | POA: Diagnosis not present

## 2023-11-29 DIAGNOSIS — R578 Other shock: Secondary | ICD-10-CM | POA: Diagnosis not present

## 2023-11-29 DIAGNOSIS — N170 Acute kidney failure with tubular necrosis: Secondary | ICD-10-CM | POA: Diagnosis not present

## 2023-11-29 DIAGNOSIS — N179 Acute kidney failure, unspecified: Secondary | ICD-10-CM | POA: Diagnosis not present

## 2023-11-29 DIAGNOSIS — L732 Hidradenitis suppurativa: Secondary | ICD-10-CM | POA: Diagnosis not present

## 2023-11-29 DIAGNOSIS — B377 Candidal sepsis: Secondary | ICD-10-CM | POA: Diagnosis not present

## 2023-11-29 DIAGNOSIS — J439 Emphysema, unspecified: Secondary | ICD-10-CM | POA: Diagnosis not present

## 2023-11-29 DIAGNOSIS — R5081 Fever presenting with conditions classified elsewhere: Secondary | ICD-10-CM | POA: Diagnosis not present

## 2023-11-29 DIAGNOSIS — K921 Melena: Secondary | ICD-10-CM | POA: Diagnosis not present

## 2023-11-29 DIAGNOSIS — K8020 Calculus of gallbladder without cholecystitis without obstruction: Secondary | ICD-10-CM | POA: Diagnosis not present

## 2023-11-30 DIAGNOSIS — J984 Other disorders of lung: Secondary | ICD-10-CM | POA: Diagnosis not present

## 2023-11-30 DIAGNOSIS — C3490 Malignant neoplasm of unspecified part of unspecified bronchus or lung: Secondary | ICD-10-CM | POA: Diagnosis not present

## 2023-11-30 DIAGNOSIS — J9691 Respiratory failure, unspecified with hypoxia: Secondary | ICD-10-CM | POA: Diagnosis not present

## 2023-11-30 DIAGNOSIS — B377 Candidal sepsis: Secondary | ICD-10-CM | POA: Diagnosis not present

## 2023-11-30 DIAGNOSIS — R5081 Fever presenting with conditions classified elsewhere: Secondary | ICD-10-CM | POA: Diagnosis not present

## 2023-11-30 DIAGNOSIS — D61818 Other pancytopenia: Secondary | ICD-10-CM | POA: Diagnosis not present

## 2023-11-30 DIAGNOSIS — D849 Immunodeficiency, unspecified: Secondary | ICD-10-CM | POA: Diagnosis not present

## 2023-11-30 DIAGNOSIS — Z452 Encounter for adjustment and management of vascular access device: Secondary | ICD-10-CM | POA: Diagnosis not present

## 2023-11-30 DIAGNOSIS — D709 Neutropenia, unspecified: Secondary | ICD-10-CM | POA: Diagnosis not present

## 2023-11-30 DIAGNOSIS — N179 Acute kidney failure, unspecified: Secondary | ICD-10-CM | POA: Diagnosis not present

## 2023-12-01 DIAGNOSIS — D849 Immunodeficiency, unspecified: Secondary | ICD-10-CM | POA: Diagnosis not present

## 2023-12-01 DIAGNOSIS — J9691 Respiratory failure, unspecified with hypoxia: Secondary | ICD-10-CM | POA: Diagnosis not present

## 2023-12-01 DIAGNOSIS — D709 Neutropenia, unspecified: Secondary | ICD-10-CM | POA: Diagnosis not present

## 2023-12-01 DIAGNOSIS — Z452 Encounter for adjustment and management of vascular access device: Secondary | ICD-10-CM | POA: Diagnosis not present

## 2023-12-01 DIAGNOSIS — L732 Hidradenitis suppurativa: Secondary | ICD-10-CM | POA: Diagnosis not present

## 2023-12-01 DIAGNOSIS — Z4682 Encounter for fitting and adjustment of non-vascular catheter: Secondary | ICD-10-CM | POA: Diagnosis not present

## 2023-12-01 DIAGNOSIS — R5081 Fever presenting with conditions classified elsewhere: Secondary | ICD-10-CM | POA: Diagnosis not present

## 2023-12-01 DIAGNOSIS — N179 Acute kidney failure, unspecified: Secondary | ICD-10-CM | POA: Diagnosis not present

## 2023-12-01 DIAGNOSIS — B377 Candidal sepsis: Secondary | ICD-10-CM | POA: Diagnosis not present

## 2023-12-01 DIAGNOSIS — D61818 Other pancytopenia: Secondary | ICD-10-CM | POA: Diagnosis not present

## 2023-12-01 DIAGNOSIS — C3490 Malignant neoplasm of unspecified part of unspecified bronchus or lung: Secondary | ICD-10-CM | POA: Diagnosis not present

## 2023-12-02 ENCOUNTER — Other Ambulatory Visit: Payer: Self-pay | Admitting: Pharmacist

## 2023-12-02 ENCOUNTER — Telehealth: Payer: Self-pay | Admitting: Pharmacist

## 2023-12-02 DIAGNOSIS — I959 Hypotension, unspecified: Secondary | ICD-10-CM | POA: Diagnosis not present

## 2023-12-02 DIAGNOSIS — D709 Neutropenia, unspecified: Secondary | ICD-10-CM | POA: Diagnosis not present

## 2023-12-02 DIAGNOSIS — J9691 Respiratory failure, unspecified with hypoxia: Secondary | ICD-10-CM | POA: Diagnosis not present

## 2023-12-02 DIAGNOSIS — D849 Immunodeficiency, unspecified: Secondary | ICD-10-CM | POA: Diagnosis not present

## 2023-12-02 DIAGNOSIS — N179 Acute kidney failure, unspecified: Secondary | ICD-10-CM | POA: Diagnosis not present

## 2023-12-02 DIAGNOSIS — D61818 Other pancytopenia: Secondary | ICD-10-CM | POA: Diagnosis not present

## 2023-12-02 DIAGNOSIS — R5081 Fever presenting with conditions classified elsewhere: Secondary | ICD-10-CM | POA: Diagnosis not present

## 2023-12-02 NOTE — Progress Notes (Signed)
    Outreach Note  12/02/2023 Name: EDUAR KUMPF MRN: [id number] DOB: October 08, 1960  Referred by: Justus Leita DEL, MD  Attempt to reach patient by telephone today. Reach patient's wife (listed on DPR) who advises that patient is currently in the ICU at [facility]   Review shared record from [facility] and note patient admitted to [facility] since 11/17/2023 related to neutropenic fever - Will provide update to PCP  Follow Up Plan: As requested, will attempt to reach patient by telephone next month  Sharyle Sia, PharmD, [facility] [phone]

## 2023-12-03 DIAGNOSIS — R918 Other nonspecific abnormal finding of lung field: Secondary | ICD-10-CM | POA: Diagnosis not present

## 2023-12-03 DIAGNOSIS — J90 Pleural effusion, not elsewhere classified: Secondary | ICD-10-CM | POA: Diagnosis not present

## 2023-12-03 DIAGNOSIS — D709 Neutropenia, unspecified: Secondary | ICD-10-CM | POA: Diagnosis not present

## 2023-12-03 DIAGNOSIS — Z452 Encounter for adjustment and management of vascular access device: Secondary | ICD-10-CM | POA: Diagnosis not present

## 2023-12-03 DIAGNOSIS — D849 Immunodeficiency, unspecified: Secondary | ICD-10-CM | POA: Diagnosis not present

## 2023-12-03 DIAGNOSIS — J9691 Respiratory failure, unspecified with hypoxia: Secondary | ICD-10-CM | POA: Diagnosis not present

## 2023-12-03 DIAGNOSIS — B377 Candidal sepsis: Secondary | ICD-10-CM | POA: Diagnosis not present

## 2023-12-03 DIAGNOSIS — L732 Hidradenitis suppurativa: Secondary | ICD-10-CM | POA: Diagnosis not present

## 2023-12-03 DIAGNOSIS — D61818 Other pancytopenia: Secondary | ICD-10-CM | POA: Diagnosis not present

## 2023-12-03 DIAGNOSIS — N1831 Chronic kidney disease, stage 3a: Secondary | ICD-10-CM | POA: Diagnosis not present

## 2023-12-03 DIAGNOSIS — N179 Acute kidney failure, unspecified: Secondary | ICD-10-CM | POA: Diagnosis not present

## 2023-12-03 DIAGNOSIS — R5081 Fever presenting with conditions classified elsewhere: Secondary | ICD-10-CM | POA: Diagnosis not present

## 2023-12-03 DIAGNOSIS — G9341 Metabolic encephalopathy: Secondary | ICD-10-CM | POA: Diagnosis not present

## 2023-12-03 DIAGNOSIS — I959 Hypotension, unspecified: Secondary | ICD-10-CM | POA: Diagnosis not present

## 2023-12-03 DIAGNOSIS — J9811 Atelectasis: Secondary | ICD-10-CM | POA: Diagnosis not present

## 2023-12-03 DIAGNOSIS — J9601 Acute respiratory failure with hypoxia: Secondary | ICD-10-CM | POA: Diagnosis not present

## 2023-12-04 DIAGNOSIS — R5081 Fever presenting with conditions classified elsewhere: Secondary | ICD-10-CM | POA: Diagnosis not present

## 2023-12-04 DIAGNOSIS — D61818 Other pancytopenia: Secondary | ICD-10-CM | POA: Diagnosis not present

## 2023-12-04 DIAGNOSIS — R4182 Altered mental status, unspecified: Secondary | ICD-10-CM | POA: Diagnosis not present

## 2023-12-04 DIAGNOSIS — I959 Hypotension, unspecified: Secondary | ICD-10-CM | POA: Diagnosis not present

## 2023-12-04 DIAGNOSIS — K921 Melena: Secondary | ICD-10-CM | POA: Diagnosis not present

## 2023-12-04 DIAGNOSIS — N179 Acute kidney failure, unspecified: Secondary | ICD-10-CM | POA: Diagnosis not present

## 2023-12-04 DIAGNOSIS — B377 Candidal sepsis: Secondary | ICD-10-CM | POA: Diagnosis not present

## 2023-12-04 DIAGNOSIS — J9601 Acute respiratory failure with hypoxia: Secondary | ICD-10-CM | POA: Diagnosis not present

## 2023-12-04 DIAGNOSIS — D709 Neutropenia, unspecified: Secondary | ICD-10-CM | POA: Diagnosis not present

## 2023-12-04 DIAGNOSIS — T185XXA Foreign body in anus and rectum, initial encounter: Secondary | ICD-10-CM | POA: Diagnosis not present

## 2023-12-04 DIAGNOSIS — K626 Ulcer of anus and rectum: Secondary | ICD-10-CM | POA: Diagnosis not present

## 2023-12-04 DIAGNOSIS — L732 Hidradenitis suppurativa: Secondary | ICD-10-CM | POA: Diagnosis not present

## 2023-12-04 DIAGNOSIS — K625 Hemorrhage of anus and rectum: Secondary | ICD-10-CM | POA: Diagnosis not present

## 2023-12-05 DIAGNOSIS — R160 Hepatomegaly, not elsewhere classified: Secondary | ICD-10-CM | POA: Diagnosis not present

## 2023-12-05 DIAGNOSIS — R4182 Altered mental status, unspecified: Secondary | ICD-10-CM | POA: Diagnosis not present

## 2023-12-05 DIAGNOSIS — N179 Acute kidney failure, unspecified: Secondary | ICD-10-CM | POA: Diagnosis not present

## 2023-12-05 DIAGNOSIS — K8020 Calculus of gallbladder without cholecystitis without obstruction: Secondary | ICD-10-CM | POA: Diagnosis not present

## 2023-12-05 DIAGNOSIS — I10 Essential (primary) hypertension: Secondary | ICD-10-CM | POA: Diagnosis not present

## 2023-12-05 DIAGNOSIS — Z85118 Personal history of other malignant neoplasm of bronchus and lung: Secondary | ICD-10-CM | POA: Diagnosis not present

## 2023-12-05 DIAGNOSIS — J9601 Acute respiratory failure with hypoxia: Secondary | ICD-10-CM | POA: Diagnosis not present

## 2023-12-05 DIAGNOSIS — N189 Chronic kidney disease, unspecified: Secondary | ICD-10-CM | POA: Diagnosis not present

## 2023-12-05 DIAGNOSIS — I4891 Unspecified atrial fibrillation: Secondary | ICD-10-CM | POA: Diagnosis not present

## 2023-12-05 DIAGNOSIS — K828 Other specified diseases of gallbladder: Secondary | ICD-10-CM | POA: Diagnosis not present

## 2023-12-06 DIAGNOSIS — R918 Other nonspecific abnormal finding of lung field: Secondary | ICD-10-CM | POA: Diagnosis not present

## 2023-12-06 DIAGNOSIS — R4182 Altered mental status, unspecified: Secondary | ICD-10-CM | POA: Diagnosis not present

## 2023-12-06 DIAGNOSIS — D709 Neutropenia, unspecified: Secondary | ICD-10-CM | POA: Diagnosis not present

## 2023-12-06 DIAGNOSIS — B377 Candidal sepsis: Secondary | ICD-10-CM | POA: Diagnosis not present

## 2023-12-06 DIAGNOSIS — N186 End stage renal disease: Secondary | ICD-10-CM | POA: Diagnosis not present

## 2023-12-06 DIAGNOSIS — L732 Hidradenitis suppurativa: Secondary | ICD-10-CM | POA: Diagnosis not present

## 2023-12-06 DIAGNOSIS — J984 Other disorders of lung: Secondary | ICD-10-CM | POA: Diagnosis not present

## 2023-12-06 DIAGNOSIS — I10 Essential (primary) hypertension: Secondary | ICD-10-CM | POA: Diagnosis not present

## 2023-12-06 DIAGNOSIS — N179 Acute kidney failure, unspecified: Secondary | ICD-10-CM | POA: Diagnosis not present

## 2023-12-06 DIAGNOSIS — J9601 Acute respiratory failure with hypoxia: Secondary | ICD-10-CM | POA: Diagnosis not present

## 2023-12-06 DIAGNOSIS — I4891 Unspecified atrial fibrillation: Secondary | ICD-10-CM | POA: Diagnosis not present

## 2023-12-07 DIAGNOSIS — J9601 Acute respiratory failure with hypoxia: Secondary | ICD-10-CM | POA: Diagnosis not present

## 2023-12-07 DIAGNOSIS — N179 Acute kidney failure, unspecified: Secondary | ICD-10-CM | POA: Diagnosis not present

## 2023-12-07 DIAGNOSIS — D709 Neutropenia, unspecified: Secondary | ICD-10-CM | POA: Diagnosis not present

## 2023-12-07 DIAGNOSIS — K922 Gastrointestinal hemorrhage, unspecified: Secondary | ICD-10-CM | POA: Diagnosis not present

## 2023-12-07 DIAGNOSIS — N189 Chronic kidney disease, unspecified: Secondary | ICD-10-CM | POA: Diagnosis not present

## 2023-12-07 DIAGNOSIS — B377 Candidal sepsis: Secondary | ICD-10-CM | POA: Diagnosis not present

## 2023-12-07 DIAGNOSIS — R5081 Fever presenting with conditions classified elsewhere: Secondary | ICD-10-CM | POA: Diagnosis not present

## 2023-12-07 DIAGNOSIS — J984 Other disorders of lung: Secondary | ICD-10-CM | POA: Diagnosis not present

## 2023-12-08 DIAGNOSIS — J984 Other disorders of lung: Secondary | ICD-10-CM | POA: Diagnosis not present

## 2023-12-08 DIAGNOSIS — Z9911 Dependence on respirator [ventilator] status: Secondary | ICD-10-CM | POA: Diagnosis not present

## 2023-12-08 DIAGNOSIS — J9601 Acute respiratory failure with hypoxia: Secondary | ICD-10-CM | POA: Diagnosis not present

## 2023-12-08 DIAGNOSIS — K921 Melena: Secondary | ICD-10-CM | POA: Diagnosis not present

## 2023-12-08 DIAGNOSIS — D709 Neutropenia, unspecified: Secondary | ICD-10-CM | POA: Diagnosis not present

## 2023-12-08 DIAGNOSIS — D696 Thrombocytopenia, unspecified: Secondary | ICD-10-CM | POA: Diagnosis not present

## 2023-12-08 DIAGNOSIS — R9389 Abnormal findings on diagnostic imaging of other specified body structures: Secondary | ICD-10-CM | POA: Diagnosis not present

## 2023-12-08 DIAGNOSIS — N179 Acute kidney failure, unspecified: Secondary | ICD-10-CM | POA: Diagnosis not present

## 2023-12-08 DIAGNOSIS — L732 Hidradenitis suppurativa: Secondary | ICD-10-CM | POA: Diagnosis not present

## 2023-12-08 DIAGNOSIS — I10 Essential (primary) hypertension: Secondary | ICD-10-CM | POA: Diagnosis not present

## 2023-12-08 DIAGNOSIS — R4182 Altered mental status, unspecified: Secondary | ICD-10-CM | POA: Diagnosis not present

## 2023-12-08 DIAGNOSIS — R918 Other nonspecific abnormal finding of lung field: Secondary | ICD-10-CM | POA: Diagnosis not present

## 2023-12-08 DIAGNOSIS — I4891 Unspecified atrial fibrillation: Secondary | ICD-10-CM | POA: Diagnosis not present

## 2023-12-08 DIAGNOSIS — B377 Candidal sepsis: Secondary | ICD-10-CM | POA: Diagnosis not present

## 2023-12-09 ENCOUNTER — Other Ambulatory Visit: Payer: Self-pay

## 2023-12-09 DIAGNOSIS — K6289 Other specified diseases of anus and rectum: Secondary | ICD-10-CM | POA: Diagnosis not present

## 2023-12-09 DIAGNOSIS — E1122 Type 2 diabetes mellitus with diabetic chronic kidney disease: Secondary | ICD-10-CM | POA: Diagnosis not present

## 2023-12-09 DIAGNOSIS — K5521 Angiodysplasia of colon with hemorrhage: Secondary | ICD-10-CM | POA: Diagnosis not present

## 2023-12-09 DIAGNOSIS — J9601 Acute respiratory failure with hypoxia: Secondary | ICD-10-CM | POA: Diagnosis not present

## 2023-12-09 DIAGNOSIS — T185XXA Foreign body in anus and rectum, initial encounter: Secondary | ICD-10-CM | POA: Diagnosis not present

## 2023-12-09 DIAGNOSIS — B377 Candidal sepsis: Secondary | ICD-10-CM | POA: Diagnosis not present

## 2023-12-09 DIAGNOSIS — M799 Soft tissue disorder, unspecified: Secondary | ICD-10-CM | POA: Diagnosis not present

## 2023-12-09 DIAGNOSIS — N183 Chronic kidney disease, stage 3 unspecified: Secondary | ICD-10-CM | POA: Diagnosis not present

## 2023-12-09 DIAGNOSIS — K922 Gastrointestinal hemorrhage, unspecified: Secondary | ICD-10-CM | POA: Diagnosis not present

## 2023-12-09 DIAGNOSIS — I4891 Unspecified atrial fibrillation: Secondary | ICD-10-CM | POA: Diagnosis not present

## 2023-12-09 DIAGNOSIS — R4182 Altered mental status, unspecified: Secondary | ICD-10-CM | POA: Diagnosis not present

## 2023-12-09 DIAGNOSIS — L02214 Cutaneous abscess of groin: Secondary | ICD-10-CM | POA: Diagnosis not present

## 2023-12-09 DIAGNOSIS — D709 Neutropenia, unspecified: Secondary | ICD-10-CM | POA: Diagnosis not present

## 2023-12-09 DIAGNOSIS — R188 Other ascites: Secondary | ICD-10-CM | POA: Diagnosis not present

## 2023-12-09 DIAGNOSIS — N179 Acute kidney failure, unspecified: Secondary | ICD-10-CM | POA: Diagnosis not present

## 2023-12-09 DIAGNOSIS — D61818 Other pancytopenia: Secondary | ICD-10-CM | POA: Diagnosis not present

## 2023-12-09 DIAGNOSIS — L732 Hidradenitis suppurativa: Secondary | ICD-10-CM | POA: Diagnosis not present

## 2023-12-09 DIAGNOSIS — I129 Hypertensive chronic kidney disease with stage 1 through stage 4 chronic kidney disease, or unspecified chronic kidney disease: Secondary | ICD-10-CM | POA: Diagnosis not present

## 2023-12-09 DIAGNOSIS — K921 Melena: Secondary | ICD-10-CM | POA: Diagnosis not present

## 2023-12-09 DIAGNOSIS — I10 Essential (primary) hypertension: Secondary | ICD-10-CM | POA: Diagnosis not present

## 2023-12-10 DIAGNOSIS — I4891 Unspecified atrial fibrillation: Secondary | ICD-10-CM | POA: Diagnosis not present

## 2023-12-10 DIAGNOSIS — J9601 Acute respiratory failure with hypoxia: Secondary | ICD-10-CM | POA: Diagnosis not present

## 2023-12-10 DIAGNOSIS — I10 Essential (primary) hypertension: Secondary | ICD-10-CM | POA: Diagnosis not present

## 2023-12-10 DIAGNOSIS — L989 Disorder of the skin and subcutaneous tissue, unspecified: Secondary | ICD-10-CM | POA: Diagnosis not present

## 2023-12-10 DIAGNOSIS — L732 Hidradenitis suppurativa: Secondary | ICD-10-CM | POA: Diagnosis not present

## 2023-12-10 DIAGNOSIS — N179 Acute kidney failure, unspecified: Secondary | ICD-10-CM | POA: Diagnosis not present

## 2023-12-10 DIAGNOSIS — L02214 Cutaneous abscess of groin: Secondary | ICD-10-CM | POA: Diagnosis not present

## 2023-12-10 DIAGNOSIS — B377 Candidal sepsis: Secondary | ICD-10-CM | POA: Diagnosis not present

## 2023-12-10 DIAGNOSIS — R4182 Altered mental status, unspecified: Secondary | ICD-10-CM | POA: Diagnosis not present

## 2023-12-11 DIAGNOSIS — N189 Chronic kidney disease, unspecified: Secondary | ICD-10-CM | POA: Diagnosis not present

## 2023-12-11 DIAGNOSIS — K922 Gastrointestinal hemorrhage, unspecified: Secondary | ICD-10-CM | POA: Diagnosis not present

## 2023-12-11 DIAGNOSIS — J90 Pleural effusion, not elsewhere classified: Secondary | ICD-10-CM | POA: Diagnosis not present

## 2023-12-11 DIAGNOSIS — J9601 Acute respiratory failure with hypoxia: Secondary | ICD-10-CM | POA: Diagnosis not present

## 2023-12-11 DIAGNOSIS — Z9911 Dependence on respirator [ventilator] status: Secondary | ICD-10-CM | POA: Diagnosis not present

## 2023-12-11 DIAGNOSIS — N179 Acute kidney failure, unspecified: Secondary | ICD-10-CM | POA: Diagnosis not present

## 2023-12-11 DIAGNOSIS — Z4682 Encounter for fitting and adjustment of non-vascular catheter: Secondary | ICD-10-CM | POA: Diagnosis not present

## 2023-12-12 DIAGNOSIS — N189 Chronic kidney disease, unspecified: Secondary | ICD-10-CM | POA: Diagnosis not present

## 2023-12-12 DIAGNOSIS — K922 Gastrointestinal hemorrhage, unspecified: Secondary | ICD-10-CM | POA: Diagnosis not present

## 2023-12-12 DIAGNOSIS — J9601 Acute respiratory failure with hypoxia: Secondary | ICD-10-CM | POA: Diagnosis not present

## 2023-12-12 DIAGNOSIS — N179 Acute kidney failure, unspecified: Secondary | ICD-10-CM | POA: Diagnosis not present

## 2023-12-13 DIAGNOSIS — N189 Chronic kidney disease, unspecified: Secondary | ICD-10-CM | POA: Diagnosis not present

## 2023-12-13 DIAGNOSIS — K922 Gastrointestinal hemorrhage, unspecified: Secondary | ICD-10-CM | POA: Diagnosis not present

## 2023-12-13 DIAGNOSIS — J9601 Acute respiratory failure with hypoxia: Secondary | ICD-10-CM | POA: Diagnosis not present

## 2023-12-13 DIAGNOSIS — N179 Acute kidney failure, unspecified: Secondary | ICD-10-CM | POA: Diagnosis not present

## 2023-12-14 DIAGNOSIS — Z1621 Resistance to vancomycin: Secondary | ICD-10-CM | POA: Diagnosis not present

## 2023-12-14 DIAGNOSIS — J9601 Acute respiratory failure with hypoxia: Secondary | ICD-10-CM | POA: Diagnosis not present

## 2023-12-14 DIAGNOSIS — B377 Candidal sepsis: Secondary | ICD-10-CM | POA: Diagnosis not present

## 2023-12-14 DIAGNOSIS — R578 Other shock: Secondary | ICD-10-CM | POA: Diagnosis not present

## 2023-12-14 DIAGNOSIS — N170 Acute kidney failure with tubular necrosis: Secondary | ICD-10-CM | POA: Diagnosis not present

## 2023-12-14 DIAGNOSIS — N179 Acute kidney failure, unspecified: Secondary | ICD-10-CM | POA: Diagnosis not present

## 2023-12-14 DIAGNOSIS — L732 Hidradenitis suppurativa: Secondary | ICD-10-CM | POA: Diagnosis not present

## 2023-12-14 DIAGNOSIS — N189 Chronic kidney disease, unspecified: Secondary | ICD-10-CM | POA: Diagnosis not present

## 2023-12-14 DIAGNOSIS — L02214 Cutaneous abscess of groin: Secondary | ICD-10-CM | POA: Diagnosis not present

## 2023-12-15 DIAGNOSIS — N170 Acute kidney failure with tubular necrosis: Secondary | ICD-10-CM | POA: Diagnosis not present

## 2023-12-15 DIAGNOSIS — R5081 Fever presenting with conditions classified elsewhere: Secondary | ICD-10-CM | POA: Diagnosis not present

## 2023-12-15 DIAGNOSIS — Z515 Encounter for palliative care: Secondary | ICD-10-CM | POA: Diagnosis not present

## 2023-12-15 DIAGNOSIS — Z9221 Personal history of antineoplastic chemotherapy: Secondary | ICD-10-CM | POA: Diagnosis not present

## 2023-12-15 DIAGNOSIS — C3490 Malignant neoplasm of unspecified part of unspecified bronchus or lung: Secondary | ICD-10-CM | POA: Diagnosis not present

## 2023-12-15 DIAGNOSIS — D61818 Other pancytopenia: Secondary | ICD-10-CM | POA: Diagnosis not present

## 2023-12-15 DIAGNOSIS — K5289 Other specified noninfective gastroenteritis and colitis: Secondary | ICD-10-CM | POA: Diagnosis not present

## 2023-12-15 DIAGNOSIS — D709 Neutropenia, unspecified: Secondary | ICD-10-CM | POA: Diagnosis not present

## 2023-12-15 DIAGNOSIS — N189 Chronic kidney disease, unspecified: Secondary | ICD-10-CM | POA: Diagnosis not present

## 2023-12-15 DIAGNOSIS — D696 Thrombocytopenia, unspecified: Secondary | ICD-10-CM | POA: Diagnosis not present

## 2023-12-15 DIAGNOSIS — J9601 Acute respiratory failure with hypoxia: Secondary | ICD-10-CM | POA: Diagnosis not present

## 2023-12-15 DIAGNOSIS — Z9911 Dependence on respirator [ventilator] status: Secondary | ICD-10-CM | POA: Diagnosis not present

## 2023-12-15 DIAGNOSIS — N179 Acute kidney failure, unspecified: Secondary | ICD-10-CM | POA: Diagnosis not present

## 2023-12-15 DIAGNOSIS — K921 Melena: Secondary | ICD-10-CM | POA: Diagnosis not present

## 2023-12-16 ENCOUNTER — Other Ambulatory Visit: Payer: Self-pay | Admitting: Pharmacist

## 2023-12-16 DIAGNOSIS — L732 Hidradenitis suppurativa: Secondary | ICD-10-CM | POA: Diagnosis not present

## 2023-12-16 DIAGNOSIS — N170 Acute kidney failure with tubular necrosis: Secondary | ICD-10-CM | POA: Diagnosis not present

## 2023-12-16 DIAGNOSIS — Z452 Encounter for adjustment and management of vascular access device: Secondary | ICD-10-CM | POA: Diagnosis not present

## 2023-12-16 DIAGNOSIS — B377 Candidal sepsis: Secondary | ICD-10-CM | POA: Diagnosis not present

## 2023-12-16 DIAGNOSIS — B965 Pseudomonas (aeruginosa) (mallei) (pseudomallei) as the cause of diseases classified elsewhere: Secondary | ICD-10-CM | POA: Diagnosis not present

## 2023-12-16 DIAGNOSIS — J9601 Acute respiratory failure with hypoxia: Secondary | ICD-10-CM | POA: Diagnosis not present

## 2023-12-16 DIAGNOSIS — N179 Acute kidney failure, unspecified: Secondary | ICD-10-CM | POA: Diagnosis not present

## 2023-12-16 DIAGNOSIS — N189 Chronic kidney disease, unspecified: Secondary | ICD-10-CM | POA: Diagnosis not present

## 2023-12-16 DIAGNOSIS — L02214 Cutaneous abscess of groin: Secondary | ICD-10-CM | POA: Diagnosis not present

## 2023-12-16 DIAGNOSIS — R578 Other shock: Secondary | ICD-10-CM | POA: Diagnosis not present

## 2023-12-17 DIAGNOSIS — D72810 Lymphocytopenia: Secondary | ICD-10-CM | POA: Diagnosis not present

## 2023-12-17 DIAGNOSIS — D649 Anemia, unspecified: Secondary | ICD-10-CM | POA: Diagnosis not present

## 2023-12-17 DIAGNOSIS — D72821 Monocytosis (symptomatic): Secondary | ICD-10-CM | POA: Diagnosis not present

## 2023-12-17 DIAGNOSIS — N179 Acute kidney failure, unspecified: Secondary | ICD-10-CM | POA: Diagnosis not present

## 2023-12-17 DIAGNOSIS — R578 Other shock: Secondary | ICD-10-CM | POA: Diagnosis not present

## 2023-12-17 DIAGNOSIS — D696 Thrombocytopenia, unspecified: Secondary | ICD-10-CM | POA: Diagnosis not present

## 2023-12-17 DIAGNOSIS — J9601 Acute respiratory failure with hypoxia: Secondary | ICD-10-CM | POA: Diagnosis not present

## 2023-12-17 DIAGNOSIS — N170 Acute kidney failure with tubular necrosis: Secondary | ICD-10-CM | POA: Diagnosis not present

## 2023-12-17 DIAGNOSIS — N189 Chronic kidney disease, unspecified: Secondary | ICD-10-CM | POA: Diagnosis not present

## 2023-12-18 DIAGNOSIS — B965 Pseudomonas (aeruginosa) (mallei) (pseudomallei) as the cause of diseases classified elsewhere: Secondary | ICD-10-CM | POA: Diagnosis not present

## 2023-12-18 DIAGNOSIS — N179 Acute kidney failure, unspecified: Secondary | ICD-10-CM | POA: Diagnosis not present

## 2023-12-18 DIAGNOSIS — L732 Hidradenitis suppurativa: Secondary | ICD-10-CM | POA: Diagnosis not present

## 2023-12-18 DIAGNOSIS — R52 Pain, unspecified: Secondary | ICD-10-CM | POA: Diagnosis not present

## 2023-12-18 DIAGNOSIS — I4891 Unspecified atrial fibrillation: Secondary | ICD-10-CM | POA: Diagnosis not present

## 2023-12-18 DIAGNOSIS — R4182 Altered mental status, unspecified: Secondary | ICD-10-CM | POA: Diagnosis not present

## 2023-12-18 DIAGNOSIS — J9601 Acute respiratory failure with hypoxia: Secondary | ICD-10-CM | POA: Diagnosis not present

## 2023-12-18 DIAGNOSIS — L02214 Cutaneous abscess of groin: Secondary | ICD-10-CM | POA: Diagnosis not present

## 2023-12-18 DIAGNOSIS — B377 Candidal sepsis: Secondary | ICD-10-CM | POA: Diagnosis not present

## 2023-12-19 DIAGNOSIS — R5081 Fever presenting with conditions classified elsewhere: Secondary | ICD-10-CM | POA: Diagnosis not present

## 2023-12-19 DIAGNOSIS — I4891 Unspecified atrial fibrillation: Secondary | ICD-10-CM | POA: Diagnosis not present

## 2023-12-19 DIAGNOSIS — J9601 Acute respiratory failure with hypoxia: Secondary | ICD-10-CM | POA: Diagnosis not present

## 2023-12-19 DIAGNOSIS — N179 Acute kidney failure, unspecified: Secondary | ICD-10-CM | POA: Diagnosis not present

## 2023-12-19 DIAGNOSIS — R4182 Altered mental status, unspecified: Secondary | ICD-10-CM | POA: Diagnosis not present

## 2023-12-19 DIAGNOSIS — R52 Pain, unspecified: Secondary | ICD-10-CM | POA: Diagnosis not present

## 2023-12-19 DIAGNOSIS — D709 Neutropenia, unspecified: Secondary | ICD-10-CM | POA: Diagnosis not present

## 2023-12-19 DIAGNOSIS — D649 Anemia, unspecified: Secondary | ICD-10-CM | POA: Diagnosis not present

## 2023-12-20 DIAGNOSIS — L732 Hidradenitis suppurativa: Secondary | ICD-10-CM | POA: Diagnosis not present

## 2023-12-20 DIAGNOSIS — E119 Type 2 diabetes mellitus without complications: Secondary | ICD-10-CM | POA: Diagnosis not present

## 2023-12-20 DIAGNOSIS — D61818 Other pancytopenia: Secondary | ICD-10-CM | POA: Diagnosis not present

## 2023-12-21 DIAGNOSIS — N189 Chronic kidney disease, unspecified: Secondary | ICD-10-CM | POA: Diagnosis not present

## 2023-12-21 DIAGNOSIS — I4891 Unspecified atrial fibrillation: Secondary | ICD-10-CM | POA: Diagnosis not present

## 2023-12-21 DIAGNOSIS — N179 Acute kidney failure, unspecified: Secondary | ICD-10-CM | POA: Diagnosis not present

## 2023-12-21 DIAGNOSIS — D649 Anemia, unspecified: Secondary | ICD-10-CM | POA: Diagnosis not present

## 2023-12-21 DIAGNOSIS — R4182 Altered mental status, unspecified: Secondary | ICD-10-CM | POA: Diagnosis not present

## 2023-12-22 DIAGNOSIS — N179 Acute kidney failure, unspecified: Secondary | ICD-10-CM | POA: Diagnosis not present

## 2023-12-22 DIAGNOSIS — N189 Chronic kidney disease, unspecified: Secondary | ICD-10-CM | POA: Diagnosis not present

## 2023-12-22 DIAGNOSIS — B377 Candidal sepsis: Secondary | ICD-10-CM | POA: Diagnosis not present

## 2023-12-22 DIAGNOSIS — D709 Neutropenia, unspecified: Secondary | ICD-10-CM | POA: Diagnosis not present

## 2023-12-22 DIAGNOSIS — R5081 Fever presenting with conditions classified elsewhere: Secondary | ICD-10-CM | POA: Diagnosis not present

## 2023-12-22 DIAGNOSIS — B965 Pseudomonas (aeruginosa) (mallei) (pseudomallei) as the cause of diseases classified elsewhere: Secondary | ICD-10-CM | POA: Diagnosis not present

## 2023-12-22 DIAGNOSIS — J9601 Acute respiratory failure with hypoxia: Secondary | ICD-10-CM | POA: Diagnosis not present

## 2023-12-22 DIAGNOSIS — L02214 Cutaneous abscess of groin: Secondary | ICD-10-CM | POA: Diagnosis not present

## 2023-12-22 DIAGNOSIS — L732 Hidradenitis suppurativa: Secondary | ICD-10-CM | POA: Diagnosis not present

## 2023-12-23 DIAGNOSIS — N179 Acute kidney failure, unspecified: Secondary | ICD-10-CM | POA: Diagnosis not present

## 2023-12-23 DIAGNOSIS — N281 Cyst of kidney, acquired: Secondary | ICD-10-CM | POA: Diagnosis not present

## 2023-12-23 DIAGNOSIS — R590 Localized enlarged lymph nodes: Secondary | ICD-10-CM | POA: Diagnosis not present

## 2023-12-23 DIAGNOSIS — D649 Anemia, unspecified: Secondary | ICD-10-CM | POA: Diagnosis not present

## 2023-12-23 DIAGNOSIS — I700 Atherosclerosis of aorta: Secondary | ICD-10-CM | POA: Diagnosis not present

## 2023-12-23 DIAGNOSIS — J9601 Acute respiratory failure with hypoxia: Secondary | ICD-10-CM | POA: Diagnosis not present

## 2023-12-23 DIAGNOSIS — K8020 Calculus of gallbladder without cholecystitis without obstruction: Secondary | ICD-10-CM | POA: Diagnosis not present

## 2023-12-23 DIAGNOSIS — N189 Chronic kidney disease, unspecified: Secondary | ICD-10-CM | POA: Diagnosis not present

## 2023-12-23 DIAGNOSIS — R5081 Fever presenting with conditions classified elsewhere: Secondary | ICD-10-CM | POA: Diagnosis not present

## 2023-12-23 DIAGNOSIS — D709 Neutropenia, unspecified: Secondary | ICD-10-CM | POA: Diagnosis not present

## 2023-12-23 DIAGNOSIS — K5730 Diverticulosis of large intestine without perforation or abscess without bleeding: Secondary | ICD-10-CM | POA: Diagnosis not present

## 2023-12-24 DIAGNOSIS — B377 Candidal sepsis: Secondary | ICD-10-CM | POA: Diagnosis not present

## 2023-12-24 DIAGNOSIS — D62 Acute posthemorrhagic anemia: Secondary | ICD-10-CM | POA: Diagnosis not present

## 2023-12-24 DIAGNOSIS — D709 Neutropenia, unspecified: Secondary | ICD-10-CM | POA: Diagnosis not present

## 2023-12-24 DIAGNOSIS — L732 Hidradenitis suppurativa: Secondary | ICD-10-CM | POA: Diagnosis not present

## 2023-12-24 DIAGNOSIS — N189 Chronic kidney disease, unspecified: Secondary | ICD-10-CM | POA: Diagnosis not present

## 2023-12-24 DIAGNOSIS — N179 Acute kidney failure, unspecified: Secondary | ICD-10-CM | POA: Diagnosis not present

## 2023-12-25 DIAGNOSIS — B377 Candidal sepsis: Secondary | ICD-10-CM | POA: Diagnosis not present

## 2023-12-25 DIAGNOSIS — B965 Pseudomonas (aeruginosa) (mallei) (pseudomallei) as the cause of diseases classified elsewhere: Secondary | ICD-10-CM | POA: Diagnosis not present

## 2023-12-25 DIAGNOSIS — N189 Chronic kidney disease, unspecified: Secondary | ICD-10-CM | POA: Diagnosis not present

## 2023-12-25 DIAGNOSIS — L732 Hidradenitis suppurativa: Secondary | ICD-10-CM | POA: Diagnosis not present

## 2023-12-25 DIAGNOSIS — D62 Acute posthemorrhagic anemia: Secondary | ICD-10-CM | POA: Diagnosis not present

## 2023-12-25 DIAGNOSIS — N179 Acute kidney failure, unspecified: Secondary | ICD-10-CM | POA: Diagnosis not present

## 2023-12-25 DIAGNOSIS — L02214 Cutaneous abscess of groin: Secondary | ICD-10-CM | POA: Diagnosis not present

## 2023-12-26 DIAGNOSIS — N189 Chronic kidney disease, unspecified: Secondary | ICD-10-CM | POA: Diagnosis not present

## 2023-12-26 DIAGNOSIS — D709 Neutropenia, unspecified: Secondary | ICD-10-CM | POA: Diagnosis not present

## 2023-12-26 DIAGNOSIS — J9601 Acute respiratory failure with hypoxia: Secondary | ICD-10-CM | POA: Diagnosis not present

## 2023-12-26 DIAGNOSIS — J189 Pneumonia, unspecified organism: Secondary | ICD-10-CM | POA: Diagnosis not present

## 2023-12-26 DIAGNOSIS — N179 Acute kidney failure, unspecified: Secondary | ICD-10-CM | POA: Diagnosis not present

## 2023-12-27 DIAGNOSIS — N189 Chronic kidney disease, unspecified: Secondary | ICD-10-CM | POA: Diagnosis not present

## 2023-12-27 DIAGNOSIS — N179 Acute kidney failure, unspecified: Secondary | ICD-10-CM | POA: Diagnosis not present

## 2023-12-27 DIAGNOSIS — J9601 Acute respiratory failure with hypoxia: Secondary | ICD-10-CM | POA: Diagnosis not present

## 2023-12-27 DIAGNOSIS — D61818 Other pancytopenia: Secondary | ICD-10-CM | POA: Diagnosis not present

## 2023-12-27 DIAGNOSIS — G4733 Obstructive sleep apnea (adult) (pediatric): Secondary | ICD-10-CM | POA: Diagnosis not present

## 2023-12-27 DIAGNOSIS — D62 Acute posthemorrhagic anemia: Secondary | ICD-10-CM | POA: Diagnosis not present

## 2023-12-28 DIAGNOSIS — J9601 Acute respiratory failure with hypoxia: Secondary | ICD-10-CM | POA: Diagnosis not present

## 2023-12-28 DIAGNOSIS — D62 Acute posthemorrhagic anemia: Secondary | ICD-10-CM | POA: Diagnosis not present

## 2023-12-28 DIAGNOSIS — N179 Acute kidney failure, unspecified: Secondary | ICD-10-CM | POA: Diagnosis not present

## 2023-12-28 DIAGNOSIS — N189 Chronic kidney disease, unspecified: Secondary | ICD-10-CM | POA: Diagnosis not present

## 2023-12-28 DIAGNOSIS — Z111 Encounter for screening for respiratory tuberculosis: Secondary | ICD-10-CM | POA: Diagnosis not present

## 2023-12-28 DIAGNOSIS — D649 Anemia, unspecified: Secondary | ICD-10-CM | POA: Diagnosis not present

## 2023-12-28 DIAGNOSIS — G4733 Obstructive sleep apnea (adult) (pediatric): Secondary | ICD-10-CM | POA: Diagnosis not present

## 2023-12-28 DIAGNOSIS — D61818 Other pancytopenia: Secondary | ICD-10-CM | POA: Diagnosis not present

## 2023-12-28 DIAGNOSIS — J9811 Atelectasis: Secondary | ICD-10-CM | POA: Diagnosis not present

## 2023-12-28 DIAGNOSIS — J90 Pleural effusion, not elsewhere classified: Secondary | ICD-10-CM | POA: Diagnosis not present

## 2023-12-29 DIAGNOSIS — B965 Pseudomonas (aeruginosa) (mallei) (pseudomallei) as the cause of diseases classified elsewhere: Secondary | ICD-10-CM | POA: Diagnosis not present

## 2023-12-29 DIAGNOSIS — N179 Acute kidney failure, unspecified: Secondary | ICD-10-CM | POA: Diagnosis not present

## 2023-12-29 DIAGNOSIS — G4733 Obstructive sleep apnea (adult) (pediatric): Secondary | ICD-10-CM | POA: Diagnosis not present

## 2023-12-29 DIAGNOSIS — J9601 Acute respiratory failure with hypoxia: Secondary | ICD-10-CM | POA: Diagnosis not present

## 2023-12-29 DIAGNOSIS — L02214 Cutaneous abscess of groin: Secondary | ICD-10-CM | POA: Diagnosis not present

## 2023-12-29 DIAGNOSIS — Z1621 Resistance to vancomycin: Secondary | ICD-10-CM | POA: Diagnosis not present

## 2023-12-29 DIAGNOSIS — D62 Acute posthemorrhagic anemia: Secondary | ICD-10-CM | POA: Diagnosis not present

## 2023-12-29 DIAGNOSIS — D61818 Other pancytopenia: Secondary | ICD-10-CM | POA: Diagnosis not present

## 2023-12-29 DIAGNOSIS — N189 Chronic kidney disease, unspecified: Secondary | ICD-10-CM | POA: Diagnosis not present

## 2023-12-29 DIAGNOSIS — L732 Hidradenitis suppurativa: Secondary | ICD-10-CM | POA: Diagnosis not present

## 2023-12-30 ENCOUNTER — Other Ambulatory Visit: Payer: Self-pay | Admitting: Pharmacist

## 2023-12-30 DIAGNOSIS — D62 Acute posthemorrhagic anemia: Secondary | ICD-10-CM | POA: Diagnosis not present

## 2023-12-30 DIAGNOSIS — L732 Hidradenitis suppurativa: Secondary | ICD-10-CM | POA: Diagnosis not present

## 2023-12-30 DIAGNOSIS — D649 Anemia, unspecified: Secondary | ICD-10-CM | POA: Diagnosis not present

## 2023-12-30 DIAGNOSIS — J9601 Acute respiratory failure with hypoxia: Secondary | ICD-10-CM | POA: Diagnosis not present

## 2023-12-30 DIAGNOSIS — B965 Pseudomonas (aeruginosa) (mallei) (pseudomallei) as the cause of diseases classified elsewhere: Secondary | ICD-10-CM | POA: Diagnosis not present

## 2023-12-30 DIAGNOSIS — N189 Chronic kidney disease, unspecified: Secondary | ICD-10-CM | POA: Diagnosis not present

## 2023-12-30 DIAGNOSIS — L02214 Cutaneous abscess of groin: Secondary | ICD-10-CM | POA: Diagnosis not present

## 2023-12-30 DIAGNOSIS — G4733 Obstructive sleep apnea (adult) (pediatric): Secondary | ICD-10-CM | POA: Diagnosis not present

## 2023-12-30 DIAGNOSIS — D61818 Other pancytopenia: Secondary | ICD-10-CM | POA: Diagnosis not present

## 2023-12-30 DIAGNOSIS — N179 Acute kidney failure, unspecified: Secondary | ICD-10-CM | POA: Diagnosis not present

## 2023-12-31 DIAGNOSIS — J9601 Acute respiratory failure with hypoxia: Secondary | ICD-10-CM | POA: Diagnosis not present

## 2023-12-31 DIAGNOSIS — D61818 Other pancytopenia: Secondary | ICD-10-CM | POA: Diagnosis not present

## 2023-12-31 DIAGNOSIS — D62 Acute posthemorrhagic anemia: Secondary | ICD-10-CM | POA: Diagnosis not present

## 2023-12-31 DIAGNOSIS — N189 Chronic kidney disease, unspecified: Secondary | ICD-10-CM | POA: Diagnosis not present

## 2023-12-31 DIAGNOSIS — G4733 Obstructive sleep apnea (adult) (pediatric): Secondary | ICD-10-CM | POA: Diagnosis not present

## 2024-01-01 DIAGNOSIS — D61818 Other pancytopenia: Secondary | ICD-10-CM | POA: Diagnosis not present

## 2024-01-01 DIAGNOSIS — N189 Chronic kidney disease, unspecified: Secondary | ICD-10-CM | POA: Diagnosis not present

## 2024-01-01 DIAGNOSIS — L732 Hidradenitis suppurativa: Secondary | ICD-10-CM | POA: Diagnosis not present

## 2024-01-01 DIAGNOSIS — J9601 Acute respiratory failure with hypoxia: Secondary | ICD-10-CM | POA: Diagnosis not present

## 2024-01-01 DIAGNOSIS — B965 Pseudomonas (aeruginosa) (mallei) (pseudomallei) as the cause of diseases classified elsewhere: Secondary | ICD-10-CM | POA: Diagnosis not present

## 2024-01-01 DIAGNOSIS — D649 Anemia, unspecified: Secondary | ICD-10-CM | POA: Diagnosis not present

## 2024-01-01 DIAGNOSIS — D62 Acute posthemorrhagic anemia: Secondary | ICD-10-CM | POA: Diagnosis not present

## 2024-01-01 DIAGNOSIS — G4733 Obstructive sleep apnea (adult) (pediatric): Secondary | ICD-10-CM | POA: Diagnosis not present

## 2024-01-01 DIAGNOSIS — L02214 Cutaneous abscess of groin: Secondary | ICD-10-CM | POA: Diagnosis not present

## 2024-01-02 DIAGNOSIS — R1310 Dysphagia, unspecified: Secondary | ICD-10-CM | POA: Diagnosis not present

## 2024-01-02 DIAGNOSIS — N179 Acute kidney failure, unspecified: Secondary | ICD-10-CM | POA: Diagnosis not present

## 2024-01-02 DIAGNOSIS — J9601 Acute respiratory failure with hypoxia: Secondary | ICD-10-CM | POA: Diagnosis not present

## 2024-01-02 DIAGNOSIS — G4733 Obstructive sleep apnea (adult) (pediatric): Secondary | ICD-10-CM | POA: Diagnosis not present

## 2024-01-02 DIAGNOSIS — D61818 Other pancytopenia: Secondary | ICD-10-CM | POA: Diagnosis not present

## 2024-01-02 DIAGNOSIS — N189 Chronic kidney disease, unspecified: Secondary | ICD-10-CM | POA: Diagnosis not present

## 2024-01-03 DIAGNOSIS — N189 Chronic kidney disease, unspecified: Secondary | ICD-10-CM | POA: Diagnosis not present

## 2024-01-03 DIAGNOSIS — K921 Melena: Secondary | ICD-10-CM | POA: Diagnosis not present

## 2024-01-03 DIAGNOSIS — R1310 Dysphagia, unspecified: Secondary | ICD-10-CM | POA: Diagnosis not present

## 2024-01-03 DIAGNOSIS — N179 Acute kidney failure, unspecified: Secondary | ICD-10-CM | POA: Diagnosis not present

## 2024-01-03 DIAGNOSIS — J9601 Acute respiratory failure with hypoxia: Secondary | ICD-10-CM | POA: Diagnosis not present

## 2024-01-03 DIAGNOSIS — D61818 Other pancytopenia: Secondary | ICD-10-CM | POA: Diagnosis not present

## 2024-01-03 DIAGNOSIS — G4733 Obstructive sleep apnea (adult) (pediatric): Secondary | ICD-10-CM | POA: Diagnosis not present

## 2024-01-04 DIAGNOSIS — J9601 Acute respiratory failure with hypoxia: Secondary | ICD-10-CM | POA: Diagnosis not present

## 2024-01-04 DIAGNOSIS — G4733 Obstructive sleep apnea (adult) (pediatric): Secondary | ICD-10-CM | POA: Diagnosis not present

## 2024-01-04 DIAGNOSIS — N179 Acute kidney failure, unspecified: Secondary | ICD-10-CM | POA: Diagnosis not present

## 2024-01-04 DIAGNOSIS — R1310 Dysphagia, unspecified: Secondary | ICD-10-CM | POA: Diagnosis not present

## 2024-01-04 DIAGNOSIS — D61818 Other pancytopenia: Secondary | ICD-10-CM | POA: Diagnosis not present

## 2024-01-04 DIAGNOSIS — D649 Anemia, unspecified: Secondary | ICD-10-CM | POA: Diagnosis not present

## 2024-01-04 DIAGNOSIS — N189 Chronic kidney disease, unspecified: Secondary | ICD-10-CM | POA: Diagnosis not present

## 2024-01-04 DIAGNOSIS — Z452 Encounter for adjustment and management of vascular access device: Secondary | ICD-10-CM | POA: Diagnosis not present

## 2024-01-05 DIAGNOSIS — N179 Acute kidney failure, unspecified: Secondary | ICD-10-CM | POA: Diagnosis not present

## 2024-01-05 DIAGNOSIS — N189 Chronic kidney disease, unspecified: Secondary | ICD-10-CM | POA: Diagnosis not present

## 2024-01-05 DIAGNOSIS — R1310 Dysphagia, unspecified: Secondary | ICD-10-CM | POA: Diagnosis not present

## 2024-01-05 DIAGNOSIS — D72829 Elevated white blood cell count, unspecified: Secondary | ICD-10-CM | POA: Diagnosis not present

## 2024-01-05 DIAGNOSIS — E119 Type 2 diabetes mellitus without complications: Secondary | ICD-10-CM | POA: Diagnosis not present

## 2024-01-06 ENCOUNTER — Other Ambulatory Visit: Payer: Self-pay | Admitting: Pharmacist

## 2024-01-06 DIAGNOSIS — R1310 Dysphagia, unspecified: Secondary | ICD-10-CM | POA: Diagnosis not present

## 2024-01-06 DIAGNOSIS — Z992 Dependence on renal dialysis: Secondary | ICD-10-CM | POA: Diagnosis not present

## 2024-01-06 DIAGNOSIS — N179 Acute kidney failure, unspecified: Secondary | ICD-10-CM | POA: Diagnosis not present

## 2024-01-06 DIAGNOSIS — N189 Chronic kidney disease, unspecified: Secondary | ICD-10-CM | POA: Diagnosis not present

## 2024-01-06 DIAGNOSIS — E119 Type 2 diabetes mellitus without complications: Secondary | ICD-10-CM | POA: Diagnosis not present

## 2024-01-06 DIAGNOSIS — D72829 Elevated white blood cell count, unspecified: Secondary | ICD-10-CM | POA: Diagnosis not present

## 2024-01-07 DIAGNOSIS — Z452 Encounter for adjustment and management of vascular access device: Secondary | ICD-10-CM | POA: Diagnosis not present

## 2024-01-07 DIAGNOSIS — D649 Anemia, unspecified: Secondary | ICD-10-CM | POA: Diagnosis not present

## 2024-01-07 DIAGNOSIS — Z992 Dependence on renal dialysis: Secondary | ICD-10-CM | POA: Diagnosis not present

## 2024-01-07 DIAGNOSIS — D72829 Elevated white blood cell count, unspecified: Secondary | ICD-10-CM | POA: Diagnosis not present

## 2024-01-07 DIAGNOSIS — N189 Chronic kidney disease, unspecified: Secondary | ICD-10-CM | POA: Diagnosis not present

## 2024-01-07 DIAGNOSIS — R1310 Dysphagia, unspecified: Secondary | ICD-10-CM | POA: Diagnosis not present

## 2024-01-07 DIAGNOSIS — N179 Acute kidney failure, unspecified: Secondary | ICD-10-CM | POA: Diagnosis not present

## 2024-01-07 DIAGNOSIS — E119 Type 2 diabetes mellitus without complications: Secondary | ICD-10-CM | POA: Diagnosis not present

## 2024-01-08 DIAGNOSIS — D72829 Elevated white blood cell count, unspecified: Secondary | ICD-10-CM | POA: Diagnosis not present

## 2024-01-08 DIAGNOSIS — R1310 Dysphagia, unspecified: Secondary | ICD-10-CM | POA: Diagnosis not present

## 2024-01-08 DIAGNOSIS — N189 Chronic kidney disease, unspecified: Secondary | ICD-10-CM | POA: Diagnosis not present

## 2024-01-08 DIAGNOSIS — N179 Acute kidney failure, unspecified: Secondary | ICD-10-CM | POA: Diagnosis not present

## 2024-01-08 DIAGNOSIS — E119 Type 2 diabetes mellitus without complications: Secondary | ICD-10-CM | POA: Diagnosis not present

## 2024-01-08 DIAGNOSIS — Z992 Dependence on renal dialysis: Secondary | ICD-10-CM | POA: Diagnosis not present

## 2024-01-08 DIAGNOSIS — Z4901 Encounter for fitting and adjustment of extracorporeal dialysis catheter: Secondary | ICD-10-CM | POA: Diagnosis not present

## 2024-01-09 DIAGNOSIS — N179 Acute kidney failure, unspecified: Secondary | ICD-10-CM | POA: Diagnosis not present

## 2024-01-09 DIAGNOSIS — D61818 Other pancytopenia: Secondary | ICD-10-CM | POA: Diagnosis not present

## 2024-01-09 DIAGNOSIS — D649 Anemia, unspecified: Secondary | ICD-10-CM | POA: Diagnosis not present

## 2024-01-09 DIAGNOSIS — N189 Chronic kidney disease, unspecified: Secondary | ICD-10-CM | POA: Diagnosis not present

## 2024-01-09 DIAGNOSIS — R1310 Dysphagia, unspecified: Secondary | ICD-10-CM | POA: Diagnosis not present

## 2024-01-10 DIAGNOSIS — R1310 Dysphagia, unspecified: Secondary | ICD-10-CM | POA: Diagnosis not present

## 2024-01-10 DIAGNOSIS — D72829 Elevated white blood cell count, unspecified: Secondary | ICD-10-CM | POA: Diagnosis not present

## 2024-01-10 DIAGNOSIS — N189 Chronic kidney disease, unspecified: Secondary | ICD-10-CM | POA: Diagnosis not present

## 2024-01-10 DIAGNOSIS — Z992 Dependence on renal dialysis: Secondary | ICD-10-CM | POA: Diagnosis not present

## 2024-01-11 DIAGNOSIS — K5900 Constipation, unspecified: Secondary | ICD-10-CM | POA: Diagnosis not present

## 2024-01-11 DIAGNOSIS — K8020 Calculus of gallbladder without cholecystitis without obstruction: Secondary | ICD-10-CM | POA: Diagnosis not present

## 2024-01-11 DIAGNOSIS — N179 Acute kidney failure, unspecified: Secondary | ICD-10-CM | POA: Diagnosis not present

## 2024-01-11 DIAGNOSIS — R1310 Dysphagia, unspecified: Secondary | ICD-10-CM | POA: Diagnosis not present

## 2024-01-11 DIAGNOSIS — Z992 Dependence on renal dialysis: Secondary | ICD-10-CM | POA: Diagnosis not present

## 2024-01-11 DIAGNOSIS — J9601 Acute respiratory failure with hypoxia: Secondary | ICD-10-CM | POA: Diagnosis not present

## 2024-01-11 DIAGNOSIS — N189 Chronic kidney disease, unspecified: Secondary | ICD-10-CM | POA: Diagnosis not present

## 2024-01-11 DIAGNOSIS — D72829 Elevated white blood cell count, unspecified: Secondary | ICD-10-CM | POA: Diagnosis not present

## 2024-01-12 DIAGNOSIS — N189 Chronic kidney disease, unspecified: Secondary | ICD-10-CM | POA: Diagnosis not present

## 2024-01-12 DIAGNOSIS — D709 Neutropenia, unspecified: Secondary | ICD-10-CM | POA: Diagnosis not present

## 2024-01-12 DIAGNOSIS — D72829 Elevated white blood cell count, unspecified: Secondary | ICD-10-CM | POA: Diagnosis not present

## 2024-01-12 DIAGNOSIS — E119 Type 2 diabetes mellitus without complications: Secondary | ICD-10-CM | POA: Diagnosis not present

## 2024-01-12 DIAGNOSIS — R1310 Dysphagia, unspecified: Secondary | ICD-10-CM | POA: Diagnosis not present

## 2024-01-12 DIAGNOSIS — D649 Anemia, unspecified: Secondary | ICD-10-CM | POA: Diagnosis not present

## 2024-01-12 DIAGNOSIS — Z992 Dependence on renal dialysis: Secondary | ICD-10-CM | POA: Diagnosis not present

## 2024-01-12 DIAGNOSIS — R5081 Fever presenting with conditions classified elsewhere: Secondary | ICD-10-CM | POA: Diagnosis not present

## 2024-01-12 DIAGNOSIS — N179 Acute kidney failure, unspecified: Secondary | ICD-10-CM | POA: Diagnosis not present

## 2024-01-13 DIAGNOSIS — R1310 Dysphagia, unspecified: Secondary | ICD-10-CM | POA: Diagnosis not present

## 2024-01-13 DIAGNOSIS — N179 Acute kidney failure, unspecified: Secondary | ICD-10-CM | POA: Diagnosis not present

## 2024-01-13 DIAGNOSIS — N189 Chronic kidney disease, unspecified: Secondary | ICD-10-CM | POA: Diagnosis not present

## 2024-01-13 DIAGNOSIS — Z992 Dependence on renal dialysis: Secondary | ICD-10-CM | POA: Diagnosis not present

## 2024-01-13 DIAGNOSIS — D72829 Elevated white blood cell count, unspecified: Secondary | ICD-10-CM | POA: Diagnosis not present

## 2024-01-13 DIAGNOSIS — E119 Type 2 diabetes mellitus without complications: Secondary | ICD-10-CM | POA: Diagnosis not present

## 2024-01-14 DIAGNOSIS — Z992 Dependence on renal dialysis: Secondary | ICD-10-CM | POA: Diagnosis not present

## 2024-01-14 DIAGNOSIS — R1310 Dysphagia, unspecified: Secondary | ICD-10-CM | POA: Diagnosis not present

## 2024-01-14 DIAGNOSIS — D649 Anemia, unspecified: Secondary | ICD-10-CM | POA: Diagnosis not present

## 2024-01-14 DIAGNOSIS — D72829 Elevated white blood cell count, unspecified: Secondary | ICD-10-CM | POA: Diagnosis not present

## 2024-01-14 DIAGNOSIS — N189 Chronic kidney disease, unspecified: Secondary | ICD-10-CM | POA: Diagnosis not present

## 2024-01-14 DIAGNOSIS — N179 Acute kidney failure, unspecified: Secondary | ICD-10-CM | POA: Diagnosis not present

## 2024-01-15 DIAGNOSIS — R633 Feeding difficulties, unspecified: Secondary | ICD-10-CM | POA: Diagnosis not present

## 2024-01-15 DIAGNOSIS — J9601 Acute respiratory failure with hypoxia: Secondary | ICD-10-CM | POA: Diagnosis not present

## 2024-01-15 DIAGNOSIS — N189 Chronic kidney disease, unspecified: Secondary | ICD-10-CM | POA: Diagnosis not present

## 2024-01-15 DIAGNOSIS — R1310 Dysphagia, unspecified: Secondary | ICD-10-CM | POA: Diagnosis not present

## 2024-01-15 DIAGNOSIS — D72829 Elevated white blood cell count, unspecified: Secondary | ICD-10-CM | POA: Diagnosis not present

## 2024-01-15 DIAGNOSIS — Z992 Dependence on renal dialysis: Secondary | ICD-10-CM | POA: Diagnosis not present

## 2024-01-15 DIAGNOSIS — E119 Type 2 diabetes mellitus without complications: Secondary | ICD-10-CM | POA: Diagnosis not present

## 2024-01-16 DIAGNOSIS — N189 Chronic kidney disease, unspecified: Secondary | ICD-10-CM | POA: Diagnosis not present

## 2024-01-16 DIAGNOSIS — Z992 Dependence on renal dialysis: Secondary | ICD-10-CM | POA: Diagnosis not present

## 2024-01-16 DIAGNOSIS — D72829 Elevated white blood cell count, unspecified: Secondary | ICD-10-CM | POA: Diagnosis not present

## 2024-01-16 DIAGNOSIS — E119 Type 2 diabetes mellitus without complications: Secondary | ICD-10-CM | POA: Diagnosis not present

## 2024-01-16 DIAGNOSIS — R1310 Dysphagia, unspecified: Secondary | ICD-10-CM | POA: Diagnosis not present

## 2024-01-16 DIAGNOSIS — D649 Anemia, unspecified: Secondary | ICD-10-CM | POA: Diagnosis not present

## 2024-01-17 DIAGNOSIS — R1310 Dysphagia, unspecified: Secondary | ICD-10-CM | POA: Diagnosis not present

## 2024-01-17 DIAGNOSIS — Z992 Dependence on renal dialysis: Secondary | ICD-10-CM | POA: Diagnosis not present

## 2024-01-17 DIAGNOSIS — N179 Acute kidney failure, unspecified: Secondary | ICD-10-CM | POA: Diagnosis not present

## 2024-01-17 DIAGNOSIS — E119 Type 2 diabetes mellitus without complications: Secondary | ICD-10-CM | POA: Diagnosis not present

## 2024-01-17 DIAGNOSIS — N189 Chronic kidney disease, unspecified: Secondary | ICD-10-CM | POA: Diagnosis not present

## 2024-01-18 DIAGNOSIS — E119 Type 2 diabetes mellitus without complications: Secondary | ICD-10-CM | POA: Diagnosis not present

## 2024-01-18 DIAGNOSIS — R1310 Dysphagia, unspecified: Secondary | ICD-10-CM | POA: Diagnosis not present

## 2024-01-18 DIAGNOSIS — N189 Chronic kidney disease, unspecified: Secondary | ICD-10-CM | POA: Diagnosis not present

## 2024-01-18 DIAGNOSIS — Z992 Dependence on renal dialysis: Secondary | ICD-10-CM | POA: Diagnosis not present

## 2024-01-18 DIAGNOSIS — J9601 Acute respiratory failure with hypoxia: Secondary | ICD-10-CM | POA: Diagnosis not present

## 2024-01-19 DIAGNOSIS — E119 Type 2 diabetes mellitus without complications: Secondary | ICD-10-CM | POA: Diagnosis not present

## 2024-01-19 DIAGNOSIS — Z431 Encounter for attention to gastrostomy: Secondary | ICD-10-CM | POA: Diagnosis not present

## 2024-01-19 DIAGNOSIS — I120 Hypertensive chronic kidney disease with stage 5 chronic kidney disease or end stage renal disease: Secondary | ICD-10-CM | POA: Diagnosis not present

## 2024-01-19 DIAGNOSIS — K2960 Other gastritis without bleeding: Secondary | ICD-10-CM | POA: Diagnosis not present

## 2024-01-19 DIAGNOSIS — N186 End stage renal disease: Secondary | ICD-10-CM | POA: Diagnosis not present

## 2024-01-19 DIAGNOSIS — Z992 Dependence on renal dialysis: Secondary | ICD-10-CM | POA: Diagnosis not present

## 2024-01-19 DIAGNOSIS — R1310 Dysphagia, unspecified: Secondary | ICD-10-CM | POA: Diagnosis not present

## 2024-01-19 DIAGNOSIS — D649 Anemia, unspecified: Secondary | ICD-10-CM | POA: Diagnosis not present

## 2024-01-19 DIAGNOSIS — N179 Acute kidney failure, unspecified: Secondary | ICD-10-CM | POA: Diagnosis not present

## 2024-01-19 DIAGNOSIS — E1122 Type 2 diabetes mellitus with diabetic chronic kidney disease: Secondary | ICD-10-CM | POA: Diagnosis not present

## 2024-01-19 DIAGNOSIS — K259 Gastric ulcer, unspecified as acute or chronic, without hemorrhage or perforation: Secondary | ICD-10-CM | POA: Diagnosis not present

## 2024-01-19 DIAGNOSIS — N189 Chronic kidney disease, unspecified: Secondary | ICD-10-CM | POA: Diagnosis not present

## 2024-01-20 DIAGNOSIS — D72829 Elevated white blood cell count, unspecified: Secondary | ICD-10-CM | POA: Diagnosis not present

## 2024-01-20 DIAGNOSIS — N179 Acute kidney failure, unspecified: Secondary | ICD-10-CM | POA: Diagnosis not present

## 2024-01-20 DIAGNOSIS — D709 Neutropenia, unspecified: Secondary | ICD-10-CM | POA: Diagnosis not present

## 2024-01-20 DIAGNOSIS — K8020 Calculus of gallbladder without cholecystitis without obstruction: Secondary | ICD-10-CM | POA: Diagnosis not present

## 2024-01-20 DIAGNOSIS — G4733 Obstructive sleep apnea (adult) (pediatric): Secondary | ICD-10-CM | POA: Diagnosis not present

## 2024-01-20 DIAGNOSIS — R1310 Dysphagia, unspecified: Secondary | ICD-10-CM | POA: Diagnosis not present

## 2024-01-20 DIAGNOSIS — R5081 Fever presenting with conditions classified elsewhere: Secondary | ICD-10-CM | POA: Diagnosis not present

## 2024-01-20 DIAGNOSIS — E119 Type 2 diabetes mellitus without complications: Secondary | ICD-10-CM | POA: Diagnosis not present

## 2024-01-20 DIAGNOSIS — I4891 Unspecified atrial fibrillation: Secondary | ICD-10-CM | POA: Diagnosis not present

## 2024-01-21 DIAGNOSIS — I4891 Unspecified atrial fibrillation: Secondary | ICD-10-CM | POA: Diagnosis not present

## 2024-01-21 DIAGNOSIS — R1310 Dysphagia, unspecified: Secondary | ICD-10-CM | POA: Diagnosis not present

## 2024-01-21 DIAGNOSIS — E119 Type 2 diabetes mellitus without complications: Secondary | ICD-10-CM | POA: Diagnosis not present

## 2024-01-21 DIAGNOSIS — D649 Anemia, unspecified: Secondary | ICD-10-CM | POA: Diagnosis not present

## 2024-01-21 DIAGNOSIS — G4733 Obstructive sleep apnea (adult) (pediatric): Secondary | ICD-10-CM | POA: Diagnosis not present

## 2024-01-21 DIAGNOSIS — D72829 Elevated white blood cell count, unspecified: Secondary | ICD-10-CM | POA: Diagnosis not present

## 2024-01-22 DIAGNOSIS — R6339 Other feeding difficulties: Secondary | ICD-10-CM | POA: Diagnosis not present

## 2024-01-22 DIAGNOSIS — N179 Acute kidney failure, unspecified: Secondary | ICD-10-CM | POA: Diagnosis not present

## 2024-01-22 DIAGNOSIS — E1122 Type 2 diabetes mellitus with diabetic chronic kidney disease: Secondary | ICD-10-CM | POA: Diagnosis not present

## 2024-01-22 DIAGNOSIS — G4733 Obstructive sleep apnea (adult) (pediatric): Secondary | ICD-10-CM | POA: Diagnosis not present

## 2024-01-22 DIAGNOSIS — I4891 Unspecified atrial fibrillation: Secondary | ICD-10-CM | POA: Diagnosis not present

## 2024-01-22 DIAGNOSIS — I120 Hypertensive chronic kidney disease with stage 5 chronic kidney disease or end stage renal disease: Secondary | ICD-10-CM | POA: Diagnosis not present

## 2024-01-22 DIAGNOSIS — Z992 Dependence on renal dialysis: Secondary | ICD-10-CM | POA: Diagnosis not present

## 2024-01-22 DIAGNOSIS — E119 Type 2 diabetes mellitus without complications: Secondary | ICD-10-CM | POA: Diagnosis not present

## 2024-01-22 DIAGNOSIS — R1310 Dysphagia, unspecified: Secondary | ICD-10-CM | POA: Diagnosis not present

## 2024-01-22 DIAGNOSIS — D72829 Elevated white blood cell count, unspecified: Secondary | ICD-10-CM | POA: Diagnosis not present

## 2024-01-22 DIAGNOSIS — N186 End stage renal disease: Secondary | ICD-10-CM | POA: Diagnosis not present

## 2024-01-23 DIAGNOSIS — D72829 Elevated white blood cell count, unspecified: Secondary | ICD-10-CM | POA: Diagnosis not present

## 2024-01-23 DIAGNOSIS — G4733 Obstructive sleep apnea (adult) (pediatric): Secondary | ICD-10-CM | POA: Diagnosis not present

## 2024-01-23 DIAGNOSIS — N179 Acute kidney failure, unspecified: Secondary | ICD-10-CM | POA: Diagnosis not present

## 2024-01-23 DIAGNOSIS — I4891 Unspecified atrial fibrillation: Secondary | ICD-10-CM | POA: Diagnosis not present

## 2024-01-23 DIAGNOSIS — E119 Type 2 diabetes mellitus without complications: Secondary | ICD-10-CM | POA: Diagnosis not present

## 2024-01-23 DIAGNOSIS — D649 Anemia, unspecified: Secondary | ICD-10-CM | POA: Diagnosis not present

## 2024-01-24 DIAGNOSIS — R1310 Dysphagia, unspecified: Secondary | ICD-10-CM | POA: Diagnosis not present

## 2024-01-24 DIAGNOSIS — K259 Gastric ulcer, unspecified as acute or chronic, without hemorrhage or perforation: Secondary | ICD-10-CM | POA: Diagnosis not present

## 2024-01-24 DIAGNOSIS — Z992 Dependence on renal dialysis: Secondary | ICD-10-CM | POA: Diagnosis not present

## 2024-01-24 DIAGNOSIS — N186 End stage renal disease: Secondary | ICD-10-CM | POA: Diagnosis not present

## 2024-01-25 DIAGNOSIS — E440 Moderate protein-calorie malnutrition: Secondary | ICD-10-CM | POA: Diagnosis not present

## 2024-01-25 DIAGNOSIS — N186 End stage renal disease: Secondary | ICD-10-CM | POA: Diagnosis not present

## 2024-01-25 DIAGNOSIS — Z992 Dependence on renal dialysis: Secondary | ICD-10-CM | POA: Diagnosis not present

## 2024-01-25 DIAGNOSIS — R1310 Dysphagia, unspecified: Secondary | ICD-10-CM | POA: Diagnosis not present

## 2024-01-25 DIAGNOSIS — N179 Acute kidney failure, unspecified: Secondary | ICD-10-CM | POA: Diagnosis not present

## 2024-01-26 DIAGNOSIS — K259 Gastric ulcer, unspecified as acute or chronic, without hemorrhage or perforation: Secondary | ICD-10-CM | POA: Diagnosis not present

## 2024-01-26 DIAGNOSIS — Z992 Dependence on renal dialysis: Secondary | ICD-10-CM | POA: Diagnosis not present

## 2024-01-26 DIAGNOSIS — R1310 Dysphagia, unspecified: Secondary | ICD-10-CM | POA: Diagnosis not present

## 2024-01-26 DIAGNOSIS — E440 Moderate protein-calorie malnutrition: Secondary | ICD-10-CM | POA: Diagnosis not present

## 2024-01-26 DIAGNOSIS — N179 Acute kidney failure, unspecified: Secondary | ICD-10-CM | POA: Diagnosis not present

## 2024-01-26 DIAGNOSIS — D649 Anemia, unspecified: Secondary | ICD-10-CM | POA: Diagnosis not present

## 2024-01-26 DIAGNOSIS — N186 End stage renal disease: Secondary | ICD-10-CM | POA: Diagnosis not present

## 2024-01-27 ENCOUNTER — Other Ambulatory Visit: Payer: Self-pay | Admitting: Pharmacist

## 2024-01-27 DIAGNOSIS — K259 Gastric ulcer, unspecified as acute or chronic, without hemorrhage or perforation: Secondary | ICD-10-CM | POA: Diagnosis not present

## 2024-01-27 DIAGNOSIS — R1310 Dysphagia, unspecified: Secondary | ICD-10-CM | POA: Diagnosis not present

## 2024-01-27 DIAGNOSIS — N186 End stage renal disease: Secondary | ICD-10-CM | POA: Diagnosis not present

## 2024-01-27 DIAGNOSIS — N179 Acute kidney failure, unspecified: Secondary | ICD-10-CM | POA: Diagnosis not present

## 2024-01-27 DIAGNOSIS — E440 Moderate protein-calorie malnutrition: Secondary | ICD-10-CM | POA: Diagnosis not present

## 2024-01-28 DIAGNOSIS — E440 Moderate protein-calorie malnutrition: Secondary | ICD-10-CM | POA: Diagnosis not present

## 2024-01-28 DIAGNOSIS — I120 Hypertensive chronic kidney disease with stage 5 chronic kidney disease or end stage renal disease: Secondary | ICD-10-CM | POA: Diagnosis not present

## 2024-01-28 DIAGNOSIS — E1122 Type 2 diabetes mellitus with diabetic chronic kidney disease: Secondary | ICD-10-CM | POA: Diagnosis not present

## 2024-01-28 DIAGNOSIS — N186 End stage renal disease: Secondary | ICD-10-CM | POA: Diagnosis not present

## 2024-01-28 DIAGNOSIS — D649 Anemia, unspecified: Secondary | ICD-10-CM | POA: Diagnosis not present

## 2024-01-28 DIAGNOSIS — K259 Gastric ulcer, unspecified as acute or chronic, without hemorrhage or perforation: Secondary | ICD-10-CM | POA: Diagnosis not present

## 2024-01-28 DIAGNOSIS — R1310 Dysphagia, unspecified: Secondary | ICD-10-CM | POA: Diagnosis not present

## 2024-01-28 DIAGNOSIS — Z992 Dependence on renal dialysis: Secondary | ICD-10-CM | POA: Diagnosis not present

## 2024-01-29 DIAGNOSIS — K259 Gastric ulcer, unspecified as acute or chronic, without hemorrhage or perforation: Secondary | ICD-10-CM | POA: Diagnosis not present

## 2024-01-29 DIAGNOSIS — I120 Hypertensive chronic kidney disease with stage 5 chronic kidney disease or end stage renal disease: Secondary | ICD-10-CM | POA: Diagnosis not present

## 2024-01-29 DIAGNOSIS — N179 Acute kidney failure, unspecified: Secondary | ICD-10-CM | POA: Diagnosis not present

## 2024-01-29 DIAGNOSIS — Z992 Dependence on renal dialysis: Secondary | ICD-10-CM | POA: Diagnosis not present

## 2024-01-29 DIAGNOSIS — N186 End stage renal disease: Secondary | ICD-10-CM | POA: Diagnosis not present

## 2024-01-29 DIAGNOSIS — E1122 Type 2 diabetes mellitus with diabetic chronic kidney disease: Secondary | ICD-10-CM | POA: Diagnosis not present

## 2024-01-29 DIAGNOSIS — R1310 Dysphagia, unspecified: Secondary | ICD-10-CM | POA: Diagnosis not present

## 2024-01-29 DIAGNOSIS — E440 Moderate protein-calorie malnutrition: Secondary | ICD-10-CM | POA: Diagnosis not present

## 2024-01-30 DIAGNOSIS — I120 Hypertensive chronic kidney disease with stage 5 chronic kidney disease or end stage renal disease: Secondary | ICD-10-CM | POA: Diagnosis not present

## 2024-01-30 DIAGNOSIS — E1122 Type 2 diabetes mellitus with diabetic chronic kidney disease: Secondary | ICD-10-CM | POA: Diagnosis not present

## 2024-01-30 DIAGNOSIS — N186 End stage renal disease: Secondary | ICD-10-CM | POA: Diagnosis not present

## 2024-01-30 DIAGNOSIS — E440 Moderate protein-calorie malnutrition: Secondary | ICD-10-CM | POA: Diagnosis not present

## 2024-01-30 DIAGNOSIS — N179 Acute kidney failure, unspecified: Secondary | ICD-10-CM | POA: Diagnosis not present

## 2024-01-30 DIAGNOSIS — R1310 Dysphagia, unspecified: Secondary | ICD-10-CM | POA: Diagnosis not present

## 2024-01-30 DIAGNOSIS — D649 Anemia, unspecified: Secondary | ICD-10-CM | POA: Diagnosis not present

## 2024-01-30 DIAGNOSIS — K259 Gastric ulcer, unspecified as acute or chronic, without hemorrhage or perforation: Secondary | ICD-10-CM | POA: Diagnosis not present

## 2024-01-30 DIAGNOSIS — M898X9 Other specified disorders of bone, unspecified site: Secondary | ICD-10-CM | POA: Diagnosis not present

## 2024-01-30 DIAGNOSIS — Z992 Dependence on renal dialysis: Secondary | ICD-10-CM | POA: Diagnosis not present

## 2024-01-31 DIAGNOSIS — K259 Gastric ulcer, unspecified as acute or chronic, without hemorrhage or perforation: Secondary | ICD-10-CM | POA: Diagnosis not present

## 2024-01-31 DIAGNOSIS — R1310 Dysphagia, unspecified: Secondary | ICD-10-CM | POA: Diagnosis not present

## 2024-01-31 DIAGNOSIS — Z992 Dependence on renal dialysis: Secondary | ICD-10-CM | POA: Diagnosis not present

## 2024-01-31 DIAGNOSIS — E1122 Type 2 diabetes mellitus with diabetic chronic kidney disease: Secondary | ICD-10-CM | POA: Diagnosis not present

## 2024-01-31 DIAGNOSIS — N186 End stage renal disease: Secondary | ICD-10-CM | POA: Diagnosis not present

## 2024-01-31 DIAGNOSIS — I120 Hypertensive chronic kidney disease with stage 5 chronic kidney disease or end stage renal disease: Secondary | ICD-10-CM | POA: Diagnosis not present

## 2024-01-31 DIAGNOSIS — N179 Acute kidney failure, unspecified: Secondary | ICD-10-CM | POA: Diagnosis not present

## 2024-02-01 DIAGNOSIS — I4891 Unspecified atrial fibrillation: Secondary | ICD-10-CM | POA: Diagnosis not present

## 2024-02-01 DIAGNOSIS — D638 Anemia in other chronic diseases classified elsewhere: Secondary | ICD-10-CM | POA: Diagnosis not present

## 2024-02-01 DIAGNOSIS — E1122 Type 2 diabetes mellitus with diabetic chronic kidney disease: Secondary | ICD-10-CM | POA: Diagnosis not present

## 2024-02-01 DIAGNOSIS — R1310 Dysphagia, unspecified: Secondary | ICD-10-CM | POA: Diagnosis not present

## 2024-02-01 DIAGNOSIS — G4733 Obstructive sleep apnea (adult) (pediatric): Secondary | ICD-10-CM | POA: Diagnosis not present

## 2024-02-01 DIAGNOSIS — K259 Gastric ulcer, unspecified as acute or chronic, without hemorrhage or perforation: Secondary | ICD-10-CM | POA: Diagnosis not present

## 2024-02-01 DIAGNOSIS — N179 Acute kidney failure, unspecified: Secondary | ICD-10-CM | POA: Diagnosis not present

## 2024-02-01 DIAGNOSIS — N186 End stage renal disease: Secondary | ICD-10-CM | POA: Diagnosis not present

## 2024-02-02 DIAGNOSIS — N179 Acute kidney failure, unspecified: Secondary | ICD-10-CM | POA: Diagnosis not present

## 2024-02-02 DIAGNOSIS — R1310 Dysphagia, unspecified: Secondary | ICD-10-CM | POA: Diagnosis not present

## 2024-02-02 DIAGNOSIS — E1122 Type 2 diabetes mellitus with diabetic chronic kidney disease: Secondary | ICD-10-CM | POA: Diagnosis not present

## 2024-02-02 DIAGNOSIS — D649 Anemia, unspecified: Secondary | ICD-10-CM | POA: Diagnosis not present

## 2024-02-02 DIAGNOSIS — Z992 Dependence on renal dialysis: Secondary | ICD-10-CM | POA: Diagnosis not present

## 2024-02-02 DIAGNOSIS — N186 End stage renal disease: Secondary | ICD-10-CM | POA: Diagnosis not present

## 2024-02-02 DIAGNOSIS — I120 Hypertensive chronic kidney disease with stage 5 chronic kidney disease or end stage renal disease: Secondary | ICD-10-CM | POA: Diagnosis not present

## 2024-02-02 DIAGNOSIS — K259 Gastric ulcer, unspecified as acute or chronic, without hemorrhage or perforation: Secondary | ICD-10-CM | POA: Diagnosis not present

## 2024-02-02 DIAGNOSIS — E440 Moderate protein-calorie malnutrition: Secondary | ICD-10-CM | POA: Diagnosis not present

## 2024-02-03 DIAGNOSIS — K259 Gastric ulcer, unspecified as acute or chronic, without hemorrhage or perforation: Secondary | ICD-10-CM | POA: Diagnosis not present

## 2024-02-03 DIAGNOSIS — Z992 Dependence on renal dialysis: Secondary | ICD-10-CM | POA: Diagnosis not present

## 2024-02-03 DIAGNOSIS — E440 Moderate protein-calorie malnutrition: Secondary | ICD-10-CM | POA: Diagnosis not present

## 2024-02-03 DIAGNOSIS — N179 Acute kidney failure, unspecified: Secondary | ICD-10-CM | POA: Diagnosis not present

## 2024-02-03 DIAGNOSIS — E1122 Type 2 diabetes mellitus with diabetic chronic kidney disease: Secondary | ICD-10-CM | POA: Diagnosis not present

## 2024-02-03 DIAGNOSIS — N186 End stage renal disease: Secondary | ICD-10-CM | POA: Diagnosis not present

## 2024-02-03 DIAGNOSIS — I120 Hypertensive chronic kidney disease with stage 5 chronic kidney disease or end stage renal disease: Secondary | ICD-10-CM | POA: Diagnosis not present

## 2024-02-03 DIAGNOSIS — R1310 Dysphagia, unspecified: Secondary | ICD-10-CM | POA: Diagnosis not present

## 2024-02-04 DIAGNOSIS — N179 Acute kidney failure, unspecified: Secondary | ICD-10-CM | POA: Diagnosis not present

## 2024-02-04 DIAGNOSIS — D649 Anemia, unspecified: Secondary | ICD-10-CM | POA: Diagnosis not present

## 2024-02-04 DIAGNOSIS — N189 Chronic kidney disease, unspecified: Secondary | ICD-10-CM | POA: Diagnosis not present

## 2024-02-05 DIAGNOSIS — R279 Unspecified lack of coordination: Secondary | ICD-10-CM | POA: Diagnosis not present

## 2024-02-05 DIAGNOSIS — N183 Chronic kidney disease, stage 3 unspecified: Secondary | ICD-10-CM | POA: Diagnosis not present

## 2024-02-05 DIAGNOSIS — M6281 Muscle weakness (generalized): Secondary | ICD-10-CM | POA: Diagnosis not present

## 2024-02-05 DIAGNOSIS — I120 Hypertensive chronic kidney disease with stage 5 chronic kidney disease or end stage renal disease: Secondary | ICD-10-CM | POA: Diagnosis not present

## 2024-02-05 DIAGNOSIS — D62 Acute posthemorrhagic anemia: Secondary | ICD-10-CM | POA: Diagnosis not present

## 2024-02-05 DIAGNOSIS — D84821 Immunodeficiency due to drugs: Secondary | ICD-10-CM | POA: Diagnosis not present

## 2024-02-05 DIAGNOSIS — K921 Melena: Secondary | ICD-10-CM | POA: Diagnosis not present

## 2024-02-05 DIAGNOSIS — C3491 Malignant neoplasm of unspecified part of right bronchus or lung: Secondary | ICD-10-CM | POA: Diagnosis not present

## 2024-02-05 DIAGNOSIS — K5520 Angiodysplasia of colon without hemorrhage: Secondary | ICD-10-CM | POA: Diagnosis not present

## 2024-02-05 DIAGNOSIS — L97122 Non-pressure chronic ulcer of left thigh with fat layer exposed: Secondary | ICD-10-CM | POA: Diagnosis not present

## 2024-02-05 DIAGNOSIS — R195 Other fecal abnormalities: Secondary | ICD-10-CM | POA: Diagnosis not present

## 2024-02-05 DIAGNOSIS — K922 Gastrointestinal hemorrhage, unspecified: Secondary | ICD-10-CM | POA: Diagnosis not present

## 2024-02-05 DIAGNOSIS — D709 Neutropenia, unspecified: Secondary | ICD-10-CM | POA: Diagnosis not present

## 2024-02-05 DIAGNOSIS — E1122 Type 2 diabetes mellitus with diabetic chronic kidney disease: Secondary | ICD-10-CM | POA: Diagnosis not present

## 2024-02-05 DIAGNOSIS — K1230 Oral mucositis (ulcerative), unspecified: Secondary | ICD-10-CM | POA: Diagnosis not present

## 2024-02-05 DIAGNOSIS — E785 Hyperlipidemia, unspecified: Secondary | ICD-10-CM | POA: Diagnosis not present

## 2024-02-05 DIAGNOSIS — I480 Paroxysmal atrial fibrillation: Secondary | ICD-10-CM | POA: Diagnosis not present

## 2024-02-05 DIAGNOSIS — D6489 Other specified anemias: Secondary | ICD-10-CM | POA: Diagnosis not present

## 2024-02-05 DIAGNOSIS — D61810 Antineoplastic chemotherapy induced pancytopenia: Secondary | ICD-10-CM | POA: Diagnosis not present

## 2024-02-05 DIAGNOSIS — J690 Pneumonitis due to inhalation of food and vomit: Secondary | ICD-10-CM | POA: Diagnosis not present

## 2024-02-05 DIAGNOSIS — C61 Malignant neoplasm of prostate: Secondary | ICD-10-CM | POA: Diagnosis not present

## 2024-02-05 DIAGNOSIS — C3431 Malignant neoplasm of lower lobe, right bronchus or lung: Secondary | ICD-10-CM | POA: Diagnosis not present

## 2024-02-05 DIAGNOSIS — R5381 Other malaise: Secondary | ICD-10-CM | POA: Diagnosis not present

## 2024-02-05 DIAGNOSIS — R531 Weakness: Secondary | ICD-10-CM | POA: Diagnosis not present

## 2024-02-05 DIAGNOSIS — N186 End stage renal disease: Secondary | ICD-10-CM | POA: Diagnosis not present

## 2024-02-05 DIAGNOSIS — Z6828 Body mass index (BMI) 28.0-28.9, adult: Secondary | ICD-10-CM | POA: Diagnosis not present

## 2024-02-05 DIAGNOSIS — R6883 Chills (without fever): Secondary | ICD-10-CM | POA: Diagnosis not present

## 2024-02-05 DIAGNOSIS — Z992 Dependence on renal dialysis: Secondary | ICD-10-CM | POA: Diagnosis not present

## 2024-02-05 DIAGNOSIS — T45515A Adverse effect of anticoagulants, initial encounter: Secondary | ICD-10-CM | POA: Diagnosis not present

## 2024-02-05 DIAGNOSIS — Z8719 Personal history of other diseases of the digestive system: Secondary | ICD-10-CM | POA: Diagnosis not present

## 2024-02-05 DIAGNOSIS — M0560 Rheumatoid arthritis of unspecified site with involvement of other organs and systems: Secondary | ICD-10-CM | POA: Diagnosis not present

## 2024-02-05 DIAGNOSIS — R2689 Other abnormalities of gait and mobility: Secondary | ICD-10-CM | POA: Diagnosis not present

## 2024-02-05 DIAGNOSIS — Z931 Gastrostomy status: Secondary | ICD-10-CM | POA: Diagnosis not present

## 2024-02-05 DIAGNOSIS — Z8711 Personal history of peptic ulcer disease: Secondary | ICD-10-CM | POA: Diagnosis not present

## 2024-02-05 DIAGNOSIS — D631 Anemia in chronic kidney disease: Secondary | ICD-10-CM | POA: Diagnosis not present

## 2024-02-05 DIAGNOSIS — E876 Hypokalemia: Secondary | ICD-10-CM | POA: Diagnosis not present

## 2024-02-05 DIAGNOSIS — M05852 Other rheumatoid arthritis with rheumatoid factor of left hip: Secondary | ICD-10-CM | POA: Diagnosis not present

## 2024-02-05 DIAGNOSIS — R262 Difficulty in walking, not elsewhere classified: Secondary | ICD-10-CM | POA: Diagnosis not present

## 2024-02-05 DIAGNOSIS — M05772 Rheumatoid arthritis with rheumatoid factor of left ankle and foot without organ or systems involvement: Secondary | ICD-10-CM | POA: Diagnosis not present

## 2024-02-05 DIAGNOSIS — E46 Unspecified protein-calorie malnutrition: Secondary | ICD-10-CM | POA: Diagnosis not present

## 2024-02-05 DIAGNOSIS — C3432 Malignant neoplasm of lower lobe, left bronchus or lung: Secondary | ICD-10-CM | POA: Diagnosis not present

## 2024-02-05 DIAGNOSIS — D649 Anemia, unspecified: Secondary | ICD-10-CM | POA: Diagnosis not present

## 2024-02-05 DIAGNOSIS — N179 Acute kidney failure, unspecified: Secondary | ICD-10-CM | POA: Diagnosis not present

## 2024-02-05 DIAGNOSIS — Z743 Need for continuous supervision: Secondary | ICD-10-CM | POA: Diagnosis not present

## 2024-02-05 DIAGNOSIS — Z8546 Personal history of malignant neoplasm of prostate: Secondary | ICD-10-CM | POA: Diagnosis not present

## 2024-02-05 DIAGNOSIS — G928 Other toxic encephalopathy: Secondary | ICD-10-CM | POA: Diagnosis not present

## 2024-02-05 DIAGNOSIS — Z794 Long term (current) use of insulin: Secondary | ICD-10-CM | POA: Diagnosis not present

## 2024-02-05 DIAGNOSIS — E440 Moderate protein-calorie malnutrition: Secondary | ICD-10-CM | POA: Diagnosis not present

## 2024-02-05 DIAGNOSIS — N189 Chronic kidney disease, unspecified: Secondary | ICD-10-CM | POA: Diagnosis not present

## 2024-02-06 ENCOUNTER — Other Ambulatory Visit: Payer: Self-pay | Admitting: Internal Medicine

## 2024-02-06 DIAGNOSIS — I10 Essential (primary) hypertension: Secondary | ICD-10-CM

## 2024-02-08 DIAGNOSIS — R5381 Other malaise: Secondary | ICD-10-CM | POA: Diagnosis not present

## 2024-02-08 NOTE — Telephone Encounter (Signed)
 Requested Prescriptions  Pending Prescriptions Disp Refills   losartan -hydrochlorothiazide  (HYZAAR) 100-25 MG tablet [Pharmacy Med Name: LOSARTAN -HCTZ 100-25 MG TAB] 90 tablet 0    Sig: TAKE 1 TABLET BY MOUTH EVERY DAY     Cardiovascular: ARB + Diuretic Combos Failed - 02/08/2024  3:47 PM      Failed - Cr in normal range and within 180 days    Creatinine  Date Value Ref Range Status  09/10/2023 1.4 (A) 0.6 - 1.3 Final  05/09/2012 1.50 (H) 0.60 - 1.30 mg/dL Final   Creatinine, Ser  Date Value Ref Range Status  05/22/2023 1.79 (H) 0.76 - 1.27 mg/dL Final         Passed - K in normal range and within 180 days    Potassium  Date Value Ref Range Status  09/10/2023 3.8 3.5 - 5.1 mEq/L Final  05/09/2012 4.1 3.5 - 5.1 mmol/L Final         Passed - Na in normal range and within 180 days    Sodium  Date Value Ref Range Status  09/10/2023 140 137 - 147 Final  05/09/2012 135 (L) 136 - 145 mmol/L Final         Passed - eGFR is 10 or above and within 180 days    EGFR (African American)  Date Value Ref Range Status  05/09/2012 >60  Final   GFR calc Af Amer  Date Value Ref Range Status  03/05/2020 39 (L) >59 mL/min/1.73 Final    Comment:    **In accordance with recommendations from the NKF-ASN Task force,**   [facility] is in the process of updating its eGFR calculation to the   2021 CKD-EPI creatinine equation that estimates kidney function   without a race variable.    EGFR (Non-African Amer.)  Date Value Ref Range Status  05/09/2012 53 (L)  Final    Comment:    eGFR values <57mL/min/1.73 m2 may be an indication of chronic kidney disease (CKD). Calculated eGFR is useful in patients with stable renal function. The eGFR calculation will not be reliable in acutely ill patients when serum creatinine is changing rapidly. It is not useful in  patients on dialysis. The eGFR calculation may not be applicable to patients at the low and high extremes of body sizes, pregnant women, and  vegetarians.    GFR calc non Af Amer  Date Value Ref Range Status  03/05/2020 33 (L) >59 mL/min/1.73 Final   eGFR  Date Value Ref Range Status  09/10/2023 57  Final  05/22/2023 42 (L) >59 mL/min/1.73 Final         Passed - Patient is not pregnant      Passed - Last BP in normal range    BP Readings from Last 1 Encounters:  10/30/23 118/76         Passed - Valid encounter within last 6 months    Recent Outpatient Visits           3 months ago Essential (primary) hypertension   [facility] at [facility], Leita DEL, MD   7 months ago Cellulitis and abscess of foot   [facility] Care & [facility] Medicine at [facility], Leita DEL, MD   8 months ago Essential (primary) hypertension   [facility] Care & Sports Medicine at [facility], Leita DEL, MD       Future Appointments  In 4 days Justus, Leita DEL, MD [facility] at [facility], [phone] [place]

## 2024-02-09 DIAGNOSIS — L97122 Non-pressure chronic ulcer of left thigh with fat layer exposed: Secondary | ICD-10-CM | POA: Diagnosis not present

## 2024-02-09 DIAGNOSIS — N186 End stage renal disease: Secondary | ICD-10-CM | POA: Diagnosis not present

## 2024-02-11 DIAGNOSIS — N179 Acute kidney failure, unspecified: Secondary | ICD-10-CM | POA: Diagnosis not present

## 2024-02-12 ENCOUNTER — Telehealth: Payer: Self-pay

## 2024-02-12 ENCOUNTER — Ambulatory Visit: Admitting: Internal Medicine

## 2024-02-12 NOTE — Telephone Encounter (Signed)
 Called patient. Could not reach at this time. If patient returns call, please schedule a Diabetes followup. Patient is overdue for A1C visit.  CM

## 2024-02-16 DIAGNOSIS — L97122 Non-pressure chronic ulcer of left thigh with fat layer exposed: Secondary | ICD-10-CM | POA: Diagnosis not present

## 2024-02-16 DIAGNOSIS — N186 End stage renal disease: Secondary | ICD-10-CM | POA: Diagnosis not present

## 2024-02-17 DIAGNOSIS — E876 Hypokalemia: Secondary | ICD-10-CM | POA: Diagnosis not present

## 2024-02-17 DIAGNOSIS — Z743 Need for continuous supervision: Secondary | ICD-10-CM | POA: Diagnosis not present

## 2024-02-17 DIAGNOSIS — N183 Chronic kidney disease, stage 3 unspecified: Secondary | ICD-10-CM | POA: Diagnosis not present

## 2024-02-17 DIAGNOSIS — D6489 Other specified anemias: Secondary | ICD-10-CM | POA: Diagnosis not present

## 2024-02-17 DIAGNOSIS — R6883 Chills (without fever): Secondary | ICD-10-CM | POA: Diagnosis not present

## 2024-02-17 DIAGNOSIS — E785 Hyperlipidemia, unspecified: Secondary | ICD-10-CM | POA: Diagnosis not present

## 2024-02-17 DIAGNOSIS — R279 Unspecified lack of coordination: Secondary | ICD-10-CM | POA: Diagnosis not present

## 2024-02-17 DIAGNOSIS — D61810 Antineoplastic chemotherapy induced pancytopenia: Secondary | ICD-10-CM | POA: Diagnosis not present

## 2024-02-17 DIAGNOSIS — T45515A Adverse effect of anticoagulants, initial encounter: Secondary | ICD-10-CM | POA: Diagnosis not present

## 2024-02-17 DIAGNOSIS — Z992 Dependence on renal dialysis: Secondary | ICD-10-CM | POA: Diagnosis not present

## 2024-02-17 DIAGNOSIS — C3491 Malignant neoplasm of unspecified part of right bronchus or lung: Secondary | ICD-10-CM | POA: Diagnosis not present

## 2024-02-17 DIAGNOSIS — D62 Acute posthemorrhagic anemia: Secondary | ICD-10-CM | POA: Diagnosis not present

## 2024-02-17 DIAGNOSIS — E1122 Type 2 diabetes mellitus with diabetic chronic kidney disease: Secondary | ICD-10-CM | POA: Diagnosis not present

## 2024-02-17 DIAGNOSIS — Z6828 Body mass index (BMI) 28.0-28.9, adult: Secondary | ICD-10-CM | POA: Diagnosis not present

## 2024-02-17 DIAGNOSIS — D631 Anemia in chronic kidney disease: Secondary | ICD-10-CM | POA: Diagnosis not present

## 2024-02-17 DIAGNOSIS — E440 Moderate protein-calorie malnutrition: Secondary | ICD-10-CM | POA: Diagnosis not present

## 2024-02-17 DIAGNOSIS — I10 Essential (primary) hypertension: Secondary | ICD-10-CM | POA: Diagnosis not present

## 2024-02-17 DIAGNOSIS — C3432 Malignant neoplasm of lower lobe, left bronchus or lung: Secondary | ICD-10-CM | POA: Diagnosis not present

## 2024-02-17 DIAGNOSIS — Z8719 Personal history of other diseases of the digestive system: Secondary | ICD-10-CM | POA: Diagnosis not present

## 2024-02-17 DIAGNOSIS — I480 Paroxysmal atrial fibrillation: Secondary | ICD-10-CM | POA: Diagnosis not present

## 2024-02-17 DIAGNOSIS — Z794 Long term (current) use of insulin: Secondary | ICD-10-CM | POA: Diagnosis not present

## 2024-02-17 DIAGNOSIS — Z8546 Personal history of malignant neoplasm of prostate: Secondary | ICD-10-CM | POA: Diagnosis not present

## 2024-02-17 DIAGNOSIS — E46 Unspecified protein-calorie malnutrition: Secondary | ICD-10-CM | POA: Diagnosis not present

## 2024-02-17 DIAGNOSIS — D696 Thrombocytopenia, unspecified: Secondary | ICD-10-CM | POA: Diagnosis not present

## 2024-02-17 DIAGNOSIS — J690 Pneumonitis due to inhalation of food and vomit: Secondary | ICD-10-CM | POA: Diagnosis not present

## 2024-02-17 DIAGNOSIS — D649 Anemia, unspecified: Secondary | ICD-10-CM | POA: Diagnosis not present

## 2024-02-17 DIAGNOSIS — D638 Anemia in other chronic diseases classified elsewhere: Secondary | ICD-10-CM | POA: Diagnosis not present

## 2024-02-17 DIAGNOSIS — E0849 Diabetes mellitus due to underlying condition with other diabetic neurological complication: Secondary | ICD-10-CM | POA: Diagnosis not present

## 2024-02-17 DIAGNOSIS — M109 Gout, unspecified: Secondary | ICD-10-CM | POA: Diagnosis not present

## 2024-02-17 DIAGNOSIS — D72829 Elevated white blood cell count, unspecified: Secondary | ICD-10-CM | POA: Diagnosis not present

## 2024-02-17 DIAGNOSIS — D84821 Immunodeficiency due to drugs: Secondary | ICD-10-CM | POA: Diagnosis not present

## 2024-02-17 DIAGNOSIS — R278 Other lack of coordination: Secondary | ICD-10-CM | POA: Diagnosis not present

## 2024-02-17 DIAGNOSIS — K5520 Angiodysplasia of colon without hemorrhage: Secondary | ICD-10-CM | POA: Diagnosis not present

## 2024-02-17 DIAGNOSIS — Z8711 Personal history of peptic ulcer disease: Secondary | ICD-10-CM | POA: Diagnosis not present

## 2024-02-17 DIAGNOSIS — N179 Acute kidney failure, unspecified: Secondary | ICD-10-CM | POA: Diagnosis not present

## 2024-02-17 DIAGNOSIS — K922 Gastrointestinal hemorrhage, unspecified: Secondary | ICD-10-CM | POA: Diagnosis not present

## 2024-02-17 DIAGNOSIS — K625 Hemorrhage of anus and rectum: Secondary | ICD-10-CM | POA: Diagnosis not present

## 2024-02-17 DIAGNOSIS — I120 Hypertensive chronic kidney disease with stage 5 chronic kidney disease or end stage renal disease: Secondary | ICD-10-CM | POA: Diagnosis not present

## 2024-02-17 DIAGNOSIS — R531 Weakness: Secondary | ICD-10-CM | POA: Diagnosis not present

## 2024-02-17 DIAGNOSIS — C3431 Malignant neoplasm of lower lobe, right bronchus or lung: Secondary | ICD-10-CM | POA: Diagnosis not present

## 2024-02-17 DIAGNOSIS — J181 Lobar pneumonia, unspecified organism: Secondary | ICD-10-CM | POA: Diagnosis not present

## 2024-02-17 DIAGNOSIS — M6281 Muscle weakness (generalized): Secondary | ICD-10-CM | POA: Diagnosis not present

## 2024-02-17 DIAGNOSIS — K921 Melena: Secondary | ICD-10-CM | POA: Diagnosis not present

## 2024-02-17 DIAGNOSIS — J189 Pneumonia, unspecified organism: Secondary | ICD-10-CM | POA: Diagnosis not present

## 2024-02-17 DIAGNOSIS — E441 Mild protein-calorie malnutrition: Secondary | ICD-10-CM | POA: Diagnosis not present

## 2024-02-17 DIAGNOSIS — C61 Malignant neoplasm of prostate: Secondary | ICD-10-CM | POA: Diagnosis not present

## 2024-02-17 DIAGNOSIS — M0560 Rheumatoid arthritis of unspecified site with involvement of other organs and systems: Secondary | ICD-10-CM | POA: Diagnosis not present

## 2024-02-17 DIAGNOSIS — N189 Chronic kidney disease, unspecified: Secondary | ICD-10-CM | POA: Diagnosis not present

## 2024-02-17 DIAGNOSIS — Z931 Gastrostomy status: Secondary | ICD-10-CM | POA: Diagnosis not present

## 2024-02-17 DIAGNOSIS — R195 Other fecal abnormalities: Secondary | ICD-10-CM | POA: Diagnosis not present

## 2024-02-17 DIAGNOSIS — N186 End stage renal disease: Secondary | ICD-10-CM | POA: Diagnosis not present

## 2024-02-17 DIAGNOSIS — L732 Hidradenitis suppurativa: Secondary | ICD-10-CM | POA: Diagnosis not present

## 2024-02-22 DIAGNOSIS — E46 Unspecified protein-calorie malnutrition: Secondary | ICD-10-CM | POA: Diagnosis not present

## 2024-02-22 DIAGNOSIS — I10 Essential (primary) hypertension: Secondary | ICD-10-CM | POA: Diagnosis not present

## 2024-02-22 DIAGNOSIS — Z743 Need for continuous supervision: Secondary | ICD-10-CM | POA: Diagnosis not present

## 2024-02-22 DIAGNOSIS — N186 End stage renal disease: Secondary | ICD-10-CM | POA: Diagnosis not present

## 2024-02-22 DIAGNOSIS — E876 Hypokalemia: Secondary | ICD-10-CM | POA: Diagnosis not present

## 2024-02-22 DIAGNOSIS — Z515 Encounter for palliative care: Secondary | ICD-10-CM | POA: Diagnosis not present

## 2024-02-22 DIAGNOSIS — L98412 Non-pressure chronic ulcer of buttock with fat layer exposed: Secondary | ICD-10-CM | POA: Diagnosis not present

## 2024-02-22 DIAGNOSIS — R6520 Severe sepsis without septic shock: Secondary | ICD-10-CM | POA: Diagnosis not present

## 2024-02-22 DIAGNOSIS — R278 Other lack of coordination: Secondary | ICD-10-CM | POA: Diagnosis not present

## 2024-02-22 DIAGNOSIS — J181 Lobar pneumonia, unspecified organism: Secondary | ICD-10-CM | POA: Diagnosis not present

## 2024-02-22 DIAGNOSIS — Z931 Gastrostomy status: Secondary | ICD-10-CM | POA: Diagnosis not present

## 2024-02-22 DIAGNOSIS — A419 Sepsis, unspecified organism: Secondary | ICD-10-CM | POA: Diagnosis not present

## 2024-02-22 DIAGNOSIS — Z923 Personal history of irradiation: Secondary | ICD-10-CM | POA: Diagnosis not present

## 2024-02-22 DIAGNOSIS — R509 Fever, unspecified: Secondary | ICD-10-CM | POA: Diagnosis not present

## 2024-02-22 DIAGNOSIS — M0560 Rheumatoid arthritis of unspecified site with involvement of other organs and systems: Secondary | ICD-10-CM | POA: Diagnosis not present

## 2024-02-22 DIAGNOSIS — D696 Thrombocytopenia, unspecified: Secondary | ICD-10-CM | POA: Diagnosis not present

## 2024-02-22 DIAGNOSIS — E0849 Diabetes mellitus due to underlying condition with other diabetic neurological complication: Secondary | ICD-10-CM | POA: Diagnosis not present

## 2024-02-22 DIAGNOSIS — E440 Moderate protein-calorie malnutrition: Secondary | ICD-10-CM | POA: Diagnosis not present

## 2024-02-22 DIAGNOSIS — J189 Pneumonia, unspecified organism: Secondary | ICD-10-CM | POA: Diagnosis not present

## 2024-02-22 DIAGNOSIS — N189 Chronic kidney disease, unspecified: Secondary | ICD-10-CM | POA: Diagnosis not present

## 2024-02-22 DIAGNOSIS — C61 Malignant neoplasm of prostate: Secondary | ICD-10-CM | POA: Diagnosis not present

## 2024-02-22 DIAGNOSIS — J690 Pneumonitis due to inhalation of food and vomit: Secondary | ICD-10-CM | POA: Diagnosis not present

## 2024-02-22 DIAGNOSIS — L732 Hidradenitis suppurativa: Secondary | ICD-10-CM | POA: Diagnosis not present

## 2024-02-22 DIAGNOSIS — N179 Acute kidney failure, unspecified: Secondary | ICD-10-CM | POA: Diagnosis not present

## 2024-02-22 DIAGNOSIS — Z1152 Encounter for screening for COVID-19: Secondary | ICD-10-CM | POA: Diagnosis not present

## 2024-02-22 DIAGNOSIS — D631 Anemia in chronic kidney disease: Secondary | ICD-10-CM | POA: Diagnosis not present

## 2024-02-22 DIAGNOSIS — M6281 Muscle weakness (generalized): Secondary | ICD-10-CM | POA: Diagnosis not present

## 2024-02-22 DIAGNOSIS — C3491 Malignant neoplasm of unspecified part of right bronchus or lung: Secondary | ICD-10-CM | POA: Diagnosis not present

## 2024-02-22 DIAGNOSIS — R4182 Altered mental status, unspecified: Secondary | ICD-10-CM | POA: Diagnosis not present

## 2024-02-22 DIAGNOSIS — M109 Gout, unspecified: Secondary | ICD-10-CM | POA: Diagnosis not present

## 2024-02-22 DIAGNOSIS — E785 Hyperlipidemia, unspecified: Secondary | ICD-10-CM | POA: Diagnosis not present

## 2024-02-22 DIAGNOSIS — J90 Pleural effusion, not elsewhere classified: Secondary | ICD-10-CM | POA: Diagnosis not present

## 2024-02-22 DIAGNOSIS — I120 Hypertensive chronic kidney disease with stage 5 chronic kidney disease or end stage renal disease: Secondary | ICD-10-CM | POA: Diagnosis not present

## 2024-02-22 DIAGNOSIS — D62 Acute posthemorrhagic anemia: Secondary | ICD-10-CM | POA: Diagnosis not present

## 2024-02-22 DIAGNOSIS — Z8546 Personal history of malignant neoplasm of prostate: Secondary | ICD-10-CM | POA: Diagnosis not present

## 2024-02-22 DIAGNOSIS — Z7901 Long term (current) use of anticoagulants: Secondary | ICD-10-CM | POA: Diagnosis not present

## 2024-02-22 DIAGNOSIS — I480 Paroxysmal atrial fibrillation: Secondary | ICD-10-CM | POA: Diagnosis not present

## 2024-02-22 DIAGNOSIS — K625 Hemorrhage of anus and rectum: Secondary | ICD-10-CM | POA: Diagnosis not present

## 2024-02-22 DIAGNOSIS — Z85118 Personal history of other malignant neoplasm of bronchus and lung: Secondary | ICD-10-CM | POA: Diagnosis not present

## 2024-02-22 DIAGNOSIS — Z992 Dependence on renal dialysis: Secondary | ICD-10-CM | POA: Diagnosis not present

## 2024-02-22 DIAGNOSIS — M1A9XX0 Chronic gout, unspecified, without tophus (tophi): Secondary | ICD-10-CM | POA: Diagnosis not present

## 2024-02-22 DIAGNOSIS — E1122 Type 2 diabetes mellitus with diabetic chronic kidney disease: Secondary | ICD-10-CM | POA: Diagnosis not present

## 2024-02-22 DIAGNOSIS — R1310 Dysphagia, unspecified: Secondary | ICD-10-CM | POA: Diagnosis not present

## 2024-02-22 DIAGNOSIS — A411 Sepsis due to other specified staphylococcus: Secondary | ICD-10-CM | POA: Diagnosis not present

## 2024-02-22 DIAGNOSIS — K922 Gastrointestinal hemorrhage, unspecified: Secondary | ICD-10-CM | POA: Diagnosis not present

## 2024-02-22 DIAGNOSIS — R279 Unspecified lack of coordination: Secondary | ICD-10-CM | POA: Diagnosis not present

## 2024-02-22 DIAGNOSIS — R079 Chest pain, unspecified: Secondary | ICD-10-CM | POA: Diagnosis not present

## 2024-02-22 DIAGNOSIS — R531 Weakness: Secondary | ICD-10-CM | POA: Diagnosis not present

## 2024-02-22 DIAGNOSIS — D6489 Other specified anemias: Secondary | ICD-10-CM | POA: Diagnosis not present

## 2024-02-23 DIAGNOSIS — N186 End stage renal disease: Secondary | ICD-10-CM | POA: Diagnosis not present

## 2024-02-23 DIAGNOSIS — L98412 Non-pressure chronic ulcer of buttock with fat layer exposed: Secondary | ICD-10-CM | POA: Diagnosis not present

## 2024-02-25 DIAGNOSIS — N179 Acute kidney failure, unspecified: Secondary | ICD-10-CM | POA: Diagnosis not present

## 2024-02-29 DIAGNOSIS — Z7901 Long term (current) use of anticoagulants: Secondary | ICD-10-CM | POA: Diagnosis not present

## 2024-02-29 DIAGNOSIS — E11628 Type 2 diabetes mellitus with other skin complications: Secondary | ICD-10-CM | POA: Diagnosis not present

## 2024-02-29 DIAGNOSIS — I10 Essential (primary) hypertension: Secondary | ICD-10-CM | POA: Diagnosis not present

## 2024-02-29 DIAGNOSIS — M26621 Arthralgia of right temporomandibular joint: Secondary | ICD-10-CM | POA: Diagnosis not present

## 2024-02-29 DIAGNOSIS — Z452 Encounter for adjustment and management of vascular access device: Secondary | ICD-10-CM | POA: Diagnosis not present

## 2024-02-29 DIAGNOSIS — M1A9XX0 Chronic gout, unspecified, without tophus (tophi): Secondary | ICD-10-CM | POA: Diagnosis not present

## 2024-02-29 DIAGNOSIS — R079 Chest pain, unspecified: Secondary | ICD-10-CM | POA: Diagnosis not present

## 2024-02-29 DIAGNOSIS — Z8546 Personal history of malignant neoplasm of prostate: Secondary | ICD-10-CM | POA: Diagnosis not present

## 2024-02-29 DIAGNOSIS — E1122 Type 2 diabetes mellitus with diabetic chronic kidney disease: Secondary | ICD-10-CM | POA: Diagnosis not present

## 2024-02-29 DIAGNOSIS — B957 Other staphylococcus as the cause of diseases classified elsewhere: Secondary | ICD-10-CM | POA: Diagnosis not present

## 2024-02-29 DIAGNOSIS — J181 Lobar pneumonia, unspecified organism: Secondary | ICD-10-CM | POA: Diagnosis not present

## 2024-02-29 DIAGNOSIS — R918 Other nonspecific abnormal finding of lung field: Secondary | ICD-10-CM | POA: Diagnosis not present

## 2024-02-29 DIAGNOSIS — Z515 Encounter for palliative care: Secondary | ICD-10-CM | POA: Diagnosis not present

## 2024-02-29 DIAGNOSIS — N186 End stage renal disease: Secondary | ICD-10-CM | POA: Diagnosis not present

## 2024-02-29 DIAGNOSIS — M26622 Arthralgia of left temporomandibular joint: Secondary | ICD-10-CM | POA: Diagnosis not present

## 2024-02-29 DIAGNOSIS — E7800 Pure hypercholesterolemia, unspecified: Secondary | ICD-10-CM | POA: Diagnosis not present

## 2024-02-29 DIAGNOSIS — J189 Pneumonia, unspecified organism: Secondary | ICD-10-CM | POA: Diagnosis not present

## 2024-02-29 DIAGNOSIS — D72829 Elevated white blood cell count, unspecified: Secondary | ICD-10-CM | POA: Diagnosis not present

## 2024-02-29 DIAGNOSIS — Z992 Dependence on renal dialysis: Secondary | ICD-10-CM | POA: Diagnosis not present

## 2024-02-29 DIAGNOSIS — Z923 Personal history of irradiation: Secondary | ICD-10-CM | POA: Diagnosis not present

## 2024-02-29 DIAGNOSIS — R4182 Altered mental status, unspecified: Secondary | ICD-10-CM | POA: Diagnosis not present

## 2024-02-29 DIAGNOSIS — Z85118 Personal history of other malignant neoplasm of bronchus and lung: Secondary | ICD-10-CM | POA: Diagnosis not present

## 2024-02-29 DIAGNOSIS — N179 Acute kidney failure, unspecified: Secondary | ICD-10-CM | POA: Diagnosis not present

## 2024-02-29 DIAGNOSIS — D631 Anemia in chronic kidney disease: Secondary | ICD-10-CM | POA: Diagnosis not present

## 2024-02-29 DIAGNOSIS — I120 Hypertensive chronic kidney disease with stage 5 chronic kidney disease or end stage renal disease: Secondary | ICD-10-CM | POA: Diagnosis not present

## 2024-02-29 DIAGNOSIS — S31109A Unspecified open wound of abdominal wall, unspecified quadrant without penetration into peritoneal cavity, initial encounter: Secondary | ICD-10-CM | POA: Diagnosis not present

## 2024-02-29 DIAGNOSIS — R5381 Other malaise: Secondary | ICD-10-CM | POA: Diagnosis not present

## 2024-02-29 DIAGNOSIS — J90 Pleural effusion, not elsewhere classified: Secondary | ICD-10-CM | POA: Diagnosis not present

## 2024-02-29 DIAGNOSIS — E440 Moderate protein-calorie malnutrition: Secondary | ICD-10-CM | POA: Diagnosis not present

## 2024-02-29 DIAGNOSIS — R531 Weakness: Secondary | ICD-10-CM | POA: Diagnosis not present

## 2024-02-29 DIAGNOSIS — E43 Unspecified severe protein-calorie malnutrition: Secondary | ICD-10-CM | POA: Diagnosis not present

## 2024-02-29 DIAGNOSIS — M109 Gout, unspecified: Secondary | ICD-10-CM | POA: Diagnosis not present

## 2024-02-29 DIAGNOSIS — R6520 Severe sepsis without septic shock: Secondary | ICD-10-CM | POA: Diagnosis not present

## 2024-02-29 DIAGNOSIS — R7881 Bacteremia: Secondary | ICD-10-CM | POA: Diagnosis not present

## 2024-02-29 DIAGNOSIS — A411 Sepsis due to other specified staphylococcus: Secondary | ICD-10-CM | POA: Diagnosis not present

## 2024-02-29 DIAGNOSIS — T148XXA Other injury of unspecified body region, initial encounter: Secondary | ICD-10-CM | POA: Diagnosis not present

## 2024-02-29 DIAGNOSIS — Z931 Gastrostomy status: Secondary | ICD-10-CM | POA: Diagnosis not present

## 2024-02-29 DIAGNOSIS — R509 Fever, unspecified: Secondary | ICD-10-CM | POA: Diagnosis not present

## 2024-02-29 DIAGNOSIS — N189 Chronic kidney disease, unspecified: Secondary | ICD-10-CM | POA: Diagnosis not present

## 2024-02-29 DIAGNOSIS — Z1152 Encounter for screening for COVID-19: Secondary | ICD-10-CM | POA: Diagnosis not present

## 2024-02-29 DIAGNOSIS — A419 Sepsis, unspecified organism: Secondary | ICD-10-CM | POA: Diagnosis not present

## 2024-02-29 DIAGNOSIS — L732 Hidradenitis suppurativa: Secondary | ICD-10-CM | POA: Diagnosis not present

## 2024-02-29 DIAGNOSIS — R197 Diarrhea, unspecified: Secondary | ICD-10-CM | POA: Diagnosis not present

## 2024-02-29 DIAGNOSIS — E876 Hypokalemia: Secondary | ICD-10-CM | POA: Diagnosis not present

## 2024-02-29 DIAGNOSIS — I480 Paroxysmal atrial fibrillation: Secondary | ICD-10-CM | POA: Diagnosis not present

## 2024-02-29 DIAGNOSIS — M26629 Arthralgia of temporomandibular joint, unspecified side: Secondary | ICD-10-CM | POA: Diagnosis not present

## 2024-02-29 DIAGNOSIS — R1310 Dysphagia, unspecified: Secondary | ICD-10-CM | POA: Diagnosis not present

## 2024-02-29 DIAGNOSIS — R627 Adult failure to thrive: Secondary | ICD-10-CM | POA: Diagnosis not present

## 2024-03-01 DIAGNOSIS — R627 Adult failure to thrive: Secondary | ICD-10-CM | POA: Diagnosis not present

## 2024-03-01 DIAGNOSIS — J189 Pneumonia, unspecified organism: Secondary | ICD-10-CM | POA: Diagnosis not present

## 2024-03-01 DIAGNOSIS — T148XXA Other injury of unspecified body region, initial encounter: Secondary | ICD-10-CM | POA: Diagnosis not present

## 2024-03-01 DIAGNOSIS — D72829 Elevated white blood cell count, unspecified: Secondary | ICD-10-CM | POA: Diagnosis not present

## 2024-03-01 DIAGNOSIS — R918 Other nonspecific abnormal finding of lung field: Secondary | ICD-10-CM | POA: Diagnosis not present

## 2024-03-01 DIAGNOSIS — L732 Hidradenitis suppurativa: Secondary | ICD-10-CM | POA: Diagnosis not present

## 2024-03-01 DIAGNOSIS — N186 End stage renal disease: Secondary | ICD-10-CM | POA: Diagnosis not present

## 2024-03-11 DIAGNOSIS — R5381 Other malaise: Secondary | ICD-10-CM | POA: Diagnosis not present

## 2024-03-15 DIAGNOSIS — N186 End stage renal disease: Secondary | ICD-10-CM | POA: Diagnosis not present

## 2024-03-15 DIAGNOSIS — L98432 Non-pressure chronic ulcer of abdomen with fat layer exposed: Secondary | ICD-10-CM | POA: Diagnosis not present

## 2024-03-15 DIAGNOSIS — Z4659 Encounter for fitting and adjustment of other gastrointestinal appliance and device: Secondary | ICD-10-CM | POA: Diagnosis not present

## 2024-03-20 DIAGNOSIS — K810 Acute cholecystitis: Secondary | ICD-10-CM | POA: Diagnosis not present

## 2024-03-20 DIAGNOSIS — R197 Diarrhea, unspecified: Secondary | ICD-10-CM | POA: Diagnosis not present

## 2024-03-20 DIAGNOSIS — K8000 Calculus of gallbladder with acute cholecystitis without obstruction: Secondary | ICD-10-CM | POA: Diagnosis not present

## 2024-03-20 DIAGNOSIS — R1011 Right upper quadrant pain: Secondary | ICD-10-CM | POA: Diagnosis not present

## 2024-03-20 DIAGNOSIS — N186 End stage renal disease: Secondary | ICD-10-CM | POA: Diagnosis not present

## 2024-03-20 DIAGNOSIS — Z992 Dependence on renal dialysis: Secondary | ICD-10-CM | POA: Diagnosis not present

## 2024-03-20 DIAGNOSIS — R1084 Generalized abdominal pain: Secondary | ICD-10-CM | POA: Diagnosis not present

## 2024-03-20 DIAGNOSIS — D631 Anemia in chronic kidney disease: Secondary | ICD-10-CM | POA: Diagnosis not present

## 2024-03-20 DIAGNOSIS — I120 Hypertensive chronic kidney disease with stage 5 chronic kidney disease or end stage renal disease: Secondary | ICD-10-CM | POA: Diagnosis not present

## 2024-03-20 DIAGNOSIS — R1111 Vomiting without nausea: Secondary | ICD-10-CM | POA: Diagnosis not present

## 2024-03-20 DIAGNOSIS — K8020 Calculus of gallbladder without cholecystitis without obstruction: Secondary | ICD-10-CM | POA: Diagnosis not present

## 2024-03-20 DIAGNOSIS — E1122 Type 2 diabetes mellitus with diabetic chronic kidney disease: Secondary | ICD-10-CM | POA: Diagnosis not present

## 2024-03-21 DIAGNOSIS — Z01810 Encounter for preprocedural cardiovascular examination: Secondary | ICD-10-CM | POA: Diagnosis not present

## 2024-03-21 DIAGNOSIS — N186 End stage renal disease: Secondary | ICD-10-CM | POA: Diagnosis not present

## 2024-03-21 DIAGNOSIS — I120 Hypertensive chronic kidney disease with stage 5 chronic kidney disease or end stage renal disease: Secondary | ICD-10-CM | POA: Diagnosis not present

## 2024-03-21 DIAGNOSIS — I480 Paroxysmal atrial fibrillation: Secondary | ICD-10-CM | POA: Diagnosis not present

## 2024-03-21 DIAGNOSIS — E1122 Type 2 diabetes mellitus with diabetic chronic kidney disease: Secondary | ICD-10-CM | POA: Diagnosis not present

## 2024-03-21 DIAGNOSIS — K810 Acute cholecystitis: Secondary | ICD-10-CM | POA: Diagnosis not present

## 2024-03-21 DIAGNOSIS — J90 Pleural effusion, not elsewhere classified: Secondary | ICD-10-CM | POA: Diagnosis not present

## 2024-03-21 DIAGNOSIS — Z992 Dependence on renal dialysis: Secondary | ICD-10-CM | POA: Diagnosis not present

## 2024-03-21 DIAGNOSIS — J189 Pneumonia, unspecified organism: Secondary | ICD-10-CM | POA: Diagnosis not present

## 2024-03-21 DIAGNOSIS — D631 Anemia in chronic kidney disease: Secondary | ICD-10-CM | POA: Diagnosis not present

## 2024-03-22 DIAGNOSIS — J189 Pneumonia, unspecified organism: Secondary | ICD-10-CM | POA: Diagnosis not present

## 2024-03-22 DIAGNOSIS — I480 Paroxysmal atrial fibrillation: Secondary | ICD-10-CM | POA: Diagnosis not present

## 2024-03-22 DIAGNOSIS — Z01810 Encounter for preprocedural cardiovascular examination: Secondary | ICD-10-CM | POA: Diagnosis not present

## 2024-03-22 DIAGNOSIS — I120 Hypertensive chronic kidney disease with stage 5 chronic kidney disease or end stage renal disease: Secondary | ICD-10-CM | POA: Diagnosis not present

## 2024-03-22 DIAGNOSIS — J90 Pleural effusion, not elsewhere classified: Secondary | ICD-10-CM | POA: Diagnosis not present

## 2024-03-22 DIAGNOSIS — E1122 Type 2 diabetes mellitus with diabetic chronic kidney disease: Secondary | ICD-10-CM | POA: Diagnosis not present

## 2024-03-22 DIAGNOSIS — N186 End stage renal disease: Secondary | ICD-10-CM | POA: Diagnosis not present

## 2024-03-23 DIAGNOSIS — N186 End stage renal disease: Secondary | ICD-10-CM | POA: Diagnosis not present

## 2024-03-23 DIAGNOSIS — Z992 Dependence on renal dialysis: Secondary | ICD-10-CM | POA: Diagnosis not present

## 2024-03-23 DIAGNOSIS — J90 Pleural effusion, not elsewhere classified: Secondary | ICD-10-CM | POA: Diagnosis not present

## 2024-03-23 DIAGNOSIS — Z01811 Encounter for preprocedural respiratory examination: Secondary | ICD-10-CM | POA: Diagnosis not present

## 2024-03-23 DIAGNOSIS — J189 Pneumonia, unspecified organism: Secondary | ICD-10-CM | POA: Diagnosis not present

## 2024-03-23 DIAGNOSIS — R1310 Dysphagia, unspecified: Secondary | ICD-10-CM | POA: Diagnosis not present

## 2024-03-23 DIAGNOSIS — K810 Acute cholecystitis: Secondary | ICD-10-CM | POA: Diagnosis not present

## 2024-03-23 DIAGNOSIS — Z01810 Encounter for preprocedural cardiovascular examination: Secondary | ICD-10-CM | POA: Diagnosis not present

## 2024-03-23 DIAGNOSIS — E1122 Type 2 diabetes mellitus with diabetic chronic kidney disease: Secondary | ICD-10-CM | POA: Diagnosis not present

## 2024-03-23 DIAGNOSIS — Z4682 Encounter for fitting and adjustment of non-vascular catheter: Secondary | ICD-10-CM | POA: Diagnosis not present

## 2024-03-23 DIAGNOSIS — I480 Paroxysmal atrial fibrillation: Secondary | ICD-10-CM | POA: Diagnosis not present

## 2024-03-23 DIAGNOSIS — I120 Hypertensive chronic kidney disease with stage 5 chronic kidney disease or end stage renal disease: Secondary | ICD-10-CM | POA: Diagnosis not present

## 2024-03-23 DIAGNOSIS — D631 Anemia in chronic kidney disease: Secondary | ICD-10-CM | POA: Diagnosis not present

## 2024-03-24 DIAGNOSIS — D631 Anemia in chronic kidney disease: Secondary | ICD-10-CM | POA: Diagnosis not present

## 2024-03-24 DIAGNOSIS — J9811 Atelectasis: Secondary | ICD-10-CM | POA: Diagnosis not present

## 2024-03-24 DIAGNOSIS — R1310 Dysphagia, unspecified: Secondary | ICD-10-CM | POA: Diagnosis not present

## 2024-03-24 DIAGNOSIS — I480 Paroxysmal atrial fibrillation: Secondary | ICD-10-CM | POA: Diagnosis not present

## 2024-03-24 DIAGNOSIS — J189 Pneumonia, unspecified organism: Secondary | ICD-10-CM | POA: Diagnosis not present

## 2024-03-24 DIAGNOSIS — K810 Acute cholecystitis: Secondary | ICD-10-CM | POA: Diagnosis not present

## 2024-03-24 DIAGNOSIS — Z992 Dependence on renal dialysis: Secondary | ICD-10-CM | POA: Diagnosis not present

## 2024-03-24 DIAGNOSIS — Z4682 Encounter for fitting and adjustment of non-vascular catheter: Secondary | ICD-10-CM | POA: Diagnosis not present

## 2024-03-24 DIAGNOSIS — N186 End stage renal disease: Secondary | ICD-10-CM | POA: Diagnosis not present

## 2024-03-24 DIAGNOSIS — J90 Pleural effusion, not elsewhere classified: Secondary | ICD-10-CM | POA: Diagnosis not present

## 2024-03-24 DIAGNOSIS — I120 Hypertensive chronic kidney disease with stage 5 chronic kidney disease or end stage renal disease: Secondary | ICD-10-CM | POA: Diagnosis not present

## 2024-03-24 DIAGNOSIS — E1122 Type 2 diabetes mellitus with diabetic chronic kidney disease: Secondary | ICD-10-CM | POA: Diagnosis not present

## 2024-03-25 ENCOUNTER — Telehealth: Payer: Self-pay

## 2024-03-25 DIAGNOSIS — Z992 Dependence on renal dialysis: Secondary | ICD-10-CM | POA: Diagnosis not present

## 2024-03-25 DIAGNOSIS — E119 Type 2 diabetes mellitus without complications: Secondary | ICD-10-CM | POA: Diagnosis not present

## 2024-03-25 DIAGNOSIS — J90 Pleural effusion, not elsewhere classified: Secondary | ICD-10-CM | POA: Diagnosis not present

## 2024-03-25 DIAGNOSIS — N186 End stage renal disease: Secondary | ICD-10-CM | POA: Diagnosis not present

## 2024-03-25 DIAGNOSIS — J189 Pneumonia, unspecified organism: Secondary | ICD-10-CM | POA: Diagnosis not present

## 2024-03-25 DIAGNOSIS — Z01811 Encounter for preprocedural respiratory examination: Secondary | ICD-10-CM | POA: Diagnosis not present

## 2024-03-25 DIAGNOSIS — K810 Acute cholecystitis: Secondary | ICD-10-CM | POA: Diagnosis not present

## 2024-03-25 DIAGNOSIS — M109 Gout, unspecified: Secondary | ICD-10-CM | POA: Diagnosis not present

## 2024-03-25 DIAGNOSIS — D631 Anemia in chronic kidney disease: Secondary | ICD-10-CM | POA: Diagnosis not present

## 2024-03-25 DIAGNOSIS — K8020 Calculus of gallbladder without cholecystitis without obstruction: Secondary | ICD-10-CM | POA: Diagnosis not present

## 2024-03-25 DIAGNOSIS — I480 Paroxysmal atrial fibrillation: Secondary | ICD-10-CM | POA: Diagnosis not present

## 2024-03-25 NOTE — Telephone Encounter (Signed)
 Patient was identified as falling into the [place] Measure - Diabetes.   Patient was: Requires a call back at a later time.Patient is currently admitted at [facility].

## 2024-03-26 DIAGNOSIS — I480 Paroxysmal atrial fibrillation: Secondary | ICD-10-CM | POA: Diagnosis not present

## 2024-03-26 DIAGNOSIS — D631 Anemia in chronic kidney disease: Secondary | ICD-10-CM | POA: Diagnosis not present

## 2024-03-26 DIAGNOSIS — Z992 Dependence on renal dialysis: Secondary | ICD-10-CM | POA: Diagnosis not present

## 2024-03-26 DIAGNOSIS — J189 Pneumonia, unspecified organism: Secondary | ICD-10-CM | POA: Diagnosis not present

## 2024-03-26 DIAGNOSIS — N186 End stage renal disease: Secondary | ICD-10-CM | POA: Diagnosis not present

## 2024-03-26 DIAGNOSIS — E119 Type 2 diabetes mellitus without complications: Secondary | ICD-10-CM | POA: Diagnosis not present

## 2024-03-26 DIAGNOSIS — K810 Acute cholecystitis: Secondary | ICD-10-CM | POA: Diagnosis not present

## 2024-03-27 DIAGNOSIS — E119 Type 2 diabetes mellitus without complications: Secondary | ICD-10-CM | POA: Diagnosis not present

## 2024-03-27 DIAGNOSIS — Z992 Dependence on renal dialysis: Secondary | ICD-10-CM | POA: Diagnosis not present

## 2024-03-27 DIAGNOSIS — D631 Anemia in chronic kidney disease: Secondary | ICD-10-CM | POA: Diagnosis not present

## 2024-03-27 DIAGNOSIS — M109 Gout, unspecified: Secondary | ICD-10-CM | POA: Diagnosis not present

## 2024-03-27 DIAGNOSIS — K810 Acute cholecystitis: Secondary | ICD-10-CM | POA: Diagnosis not present

## 2024-03-27 DIAGNOSIS — I480 Paroxysmal atrial fibrillation: Secondary | ICD-10-CM | POA: Diagnosis not present

## 2024-03-27 DIAGNOSIS — N186 End stage renal disease: Secondary | ICD-10-CM | POA: Diagnosis not present

## 2024-03-27 DIAGNOSIS — R1310 Dysphagia, unspecified: Secondary | ICD-10-CM | POA: Diagnosis not present

## 2024-03-27 DIAGNOSIS — J189 Pneumonia, unspecified organism: Secondary | ICD-10-CM | POA: Diagnosis not present

## 2024-03-28 DIAGNOSIS — Z992 Dependence on renal dialysis: Secondary | ICD-10-CM | POA: Diagnosis not present

## 2024-03-28 DIAGNOSIS — D631 Anemia in chronic kidney disease: Secondary | ICD-10-CM | POA: Diagnosis not present

## 2024-03-28 DIAGNOSIS — N186 End stage renal disease: Secondary | ICD-10-CM | POA: Diagnosis not present

## 2024-03-28 DIAGNOSIS — R1310 Dysphagia, unspecified: Secondary | ICD-10-CM | POA: Diagnosis not present

## 2024-03-28 DIAGNOSIS — J189 Pneumonia, unspecified organism: Secondary | ICD-10-CM | POA: Diagnosis not present

## 2024-03-28 DIAGNOSIS — K810 Acute cholecystitis: Secondary | ICD-10-CM | POA: Diagnosis not present

## 2024-03-28 DIAGNOSIS — E119 Type 2 diabetes mellitus without complications: Secondary | ICD-10-CM | POA: Diagnosis not present

## 2024-03-28 DIAGNOSIS — M109 Gout, unspecified: Secondary | ICD-10-CM | POA: Diagnosis not present

## 2024-03-28 DIAGNOSIS — I480 Paroxysmal atrial fibrillation: Secondary | ICD-10-CM | POA: Diagnosis not present

## 2024-03-29 DIAGNOSIS — Z992 Dependence on renal dialysis: Secondary | ICD-10-CM | POA: Diagnosis not present

## 2024-03-29 DIAGNOSIS — E119 Type 2 diabetes mellitus without complications: Secondary | ICD-10-CM | POA: Diagnosis not present

## 2024-03-29 DIAGNOSIS — I480 Paroxysmal atrial fibrillation: Secondary | ICD-10-CM | POA: Diagnosis not present

## 2024-03-29 DIAGNOSIS — K810 Acute cholecystitis: Secondary | ICD-10-CM | POA: Diagnosis not present

## 2024-03-29 DIAGNOSIS — M109 Gout, unspecified: Secondary | ICD-10-CM | POA: Diagnosis not present

## 2024-03-29 DIAGNOSIS — N186 End stage renal disease: Secondary | ICD-10-CM | POA: Diagnosis not present

## 2024-03-29 DIAGNOSIS — R1310 Dysphagia, unspecified: Secondary | ICD-10-CM | POA: Diagnosis not present

## 2024-03-29 DIAGNOSIS — D631 Anemia in chronic kidney disease: Secondary | ICD-10-CM | POA: Diagnosis not present

## 2024-03-30 DIAGNOSIS — Z992 Dependence on renal dialysis: Secondary | ICD-10-CM | POA: Diagnosis not present

## 2024-03-30 DIAGNOSIS — I480 Paroxysmal atrial fibrillation: Secondary | ICD-10-CM | POA: Diagnosis not present

## 2024-03-30 DIAGNOSIS — M109 Gout, unspecified: Secondary | ICD-10-CM | POA: Diagnosis not present

## 2024-03-30 DIAGNOSIS — D631 Anemia in chronic kidney disease: Secondary | ICD-10-CM | POA: Diagnosis not present

## 2024-03-30 DIAGNOSIS — R1310 Dysphagia, unspecified: Secondary | ICD-10-CM | POA: Diagnosis not present

## 2024-03-30 DIAGNOSIS — Z931 Gastrostomy status: Secondary | ICD-10-CM | POA: Diagnosis not present

## 2024-03-30 DIAGNOSIS — K810 Acute cholecystitis: Secondary | ICD-10-CM | POA: Diagnosis not present

## 2024-03-30 DIAGNOSIS — N186 End stage renal disease: Secondary | ICD-10-CM | POA: Diagnosis not present

## 2024-03-30 DIAGNOSIS — E875 Hyperkalemia: Secondary | ICD-10-CM | POA: Diagnosis not present

## 2024-03-30 DIAGNOSIS — D72829 Elevated white blood cell count, unspecified: Secondary | ICD-10-CM | POA: Diagnosis not present

## 2024-03-31 DIAGNOSIS — E875 Hyperkalemia: Secondary | ICD-10-CM | POA: Diagnosis not present

## 2024-03-31 DIAGNOSIS — M109 Gout, unspecified: Secondary | ICD-10-CM | POA: Diagnosis not present

## 2024-03-31 DIAGNOSIS — Z992 Dependence on renal dialysis: Secondary | ICD-10-CM | POA: Diagnosis not present

## 2024-03-31 DIAGNOSIS — Z931 Gastrostomy status: Secondary | ICD-10-CM | POA: Diagnosis not present

## 2024-03-31 DIAGNOSIS — K810 Acute cholecystitis: Secondary | ICD-10-CM | POA: Diagnosis not present

## 2024-03-31 DIAGNOSIS — I480 Paroxysmal atrial fibrillation: Secondary | ICD-10-CM | POA: Diagnosis not present

## 2024-03-31 DIAGNOSIS — N186 End stage renal disease: Secondary | ICD-10-CM | POA: Diagnosis not present

## 2024-03-31 DIAGNOSIS — R1310 Dysphagia, unspecified: Secondary | ICD-10-CM | POA: Diagnosis not present

## 2024-03-31 DIAGNOSIS — D72829 Elevated white blood cell count, unspecified: Secondary | ICD-10-CM | POA: Diagnosis not present

## 2024-03-31 DIAGNOSIS — R4182 Altered mental status, unspecified: Secondary | ICD-10-CM | POA: Diagnosis not present

## 2024-03-31 DIAGNOSIS — D631 Anemia in chronic kidney disease: Secondary | ICD-10-CM | POA: Diagnosis not present

## 2024-04-01 DIAGNOSIS — Z931 Gastrostomy status: Secondary | ICD-10-CM | POA: Diagnosis not present

## 2024-04-01 DIAGNOSIS — D631 Anemia in chronic kidney disease: Secondary | ICD-10-CM | POA: Diagnosis not present

## 2024-04-01 DIAGNOSIS — Z992 Dependence on renal dialysis: Secondary | ICD-10-CM | POA: Diagnosis not present

## 2024-04-01 DIAGNOSIS — K810 Acute cholecystitis: Secondary | ICD-10-CM | POA: Diagnosis not present

## 2024-04-01 DIAGNOSIS — J189 Pneumonia, unspecified organism: Secondary | ICD-10-CM | POA: Diagnosis not present

## 2024-04-01 DIAGNOSIS — E875 Hyperkalemia: Secondary | ICD-10-CM | POA: Diagnosis not present

## 2024-04-01 DIAGNOSIS — N186 End stage renal disease: Secondary | ICD-10-CM | POA: Diagnosis not present

## 2024-04-01 DIAGNOSIS — D72829 Elevated white blood cell count, unspecified: Secondary | ICD-10-CM | POA: Diagnosis not present

## 2024-04-02 DIAGNOSIS — N186 End stage renal disease: Secondary | ICD-10-CM | POA: Diagnosis not present

## 2024-04-02 DIAGNOSIS — E875 Hyperkalemia: Secondary | ICD-10-CM | POA: Diagnosis not present

## 2024-04-02 DIAGNOSIS — Z992 Dependence on renal dialysis: Secondary | ICD-10-CM | POA: Diagnosis not present

## 2024-04-02 DIAGNOSIS — K810 Acute cholecystitis: Secondary | ICD-10-CM | POA: Diagnosis not present

## 2024-04-02 DIAGNOSIS — D631 Anemia in chronic kidney disease: Secondary | ICD-10-CM | POA: Diagnosis not present

## 2024-04-03 DIAGNOSIS — D631 Anemia in chronic kidney disease: Secondary | ICD-10-CM | POA: Diagnosis not present

## 2024-04-03 DIAGNOSIS — K810 Acute cholecystitis: Secondary | ICD-10-CM | POA: Diagnosis not present

## 2024-04-03 DIAGNOSIS — Z992 Dependence on renal dialysis: Secondary | ICD-10-CM | POA: Diagnosis not present

## 2024-04-03 DIAGNOSIS — N186 End stage renal disease: Secondary | ICD-10-CM | POA: Diagnosis not present

## 2024-04-03 DIAGNOSIS — E875 Hyperkalemia: Secondary | ICD-10-CM | POA: Diagnosis not present

## 2024-11-02 ENCOUNTER — Ambulatory Visit
